# Patient Record
Sex: Female | Born: 1985 | Race: Black or African American | Hispanic: No | Marital: Single | Smoking: Former smoker
Health system: Southern US, Community
[De-identification: ages and names within clinical notes are randomized; demographics above are authoritative.]

## PROBLEM LIST (undated history)

## (undated) ENCOUNTER — Emergency Department (HOSPITAL_COMMUNITY): Payer: 59

## (undated) ENCOUNTER — Inpatient Hospital Stay (HOSPITAL_COMMUNITY): Payer: Self-pay

## (undated) DIAGNOSIS — R51 Headache: Secondary | ICD-10-CM

## (undated) DIAGNOSIS — G809 Cerebral palsy, unspecified: Secondary | ICD-10-CM

## (undated) DIAGNOSIS — R200 Anesthesia of skin: Secondary | ICD-10-CM

## (undated) DIAGNOSIS — R112 Nausea with vomiting, unspecified: Secondary | ICD-10-CM

## (undated) DIAGNOSIS — K769 Liver disease, unspecified: Secondary | ICD-10-CM

## (undated) DIAGNOSIS — F419 Anxiety disorder, unspecified: Secondary | ICD-10-CM

## (undated) DIAGNOSIS — H33003 Unspecified retinal detachment with retinal break, bilateral: Secondary | ICD-10-CM

## (undated) DIAGNOSIS — J309 Allergic rhinitis, unspecified: Secondary | ICD-10-CM

## (undated) DIAGNOSIS — Z9889 Other specified postprocedural states: Secondary | ICD-10-CM

## (undated) DIAGNOSIS — D649 Anemia, unspecified: Secondary | ICD-10-CM

## (undated) DIAGNOSIS — M62838 Other muscle spasm: Secondary | ICD-10-CM

## (undated) DIAGNOSIS — R32 Unspecified urinary incontinence: Secondary | ICD-10-CM

## (undated) DIAGNOSIS — Z973 Presence of spectacles and contact lenses: Secondary | ICD-10-CM

## (undated) DIAGNOSIS — Z789 Other specified health status: Secondary | ICD-10-CM

## (undated) DIAGNOSIS — J45909 Unspecified asthma, uncomplicated: Secondary | ICD-10-CM

## (undated) DIAGNOSIS — M549 Dorsalgia, unspecified: Secondary | ICD-10-CM

## (undated) DIAGNOSIS — R600 Localized edema: Secondary | ICD-10-CM

## (undated) DIAGNOSIS — G709 Myoneural disorder, unspecified: Secondary | ICD-10-CM

## (undated) DIAGNOSIS — R569 Unspecified convulsions: Secondary | ICD-10-CM

## (undated) DIAGNOSIS — I10 Essential (primary) hypertension: Secondary | ICD-10-CM

## (undated) DIAGNOSIS — G40909 Epilepsy, unspecified, not intractable, without status epilepticus: Secondary | ICD-10-CM

## (undated) DIAGNOSIS — R609 Edema, unspecified: Secondary | ICD-10-CM

## (undated) HISTORY — DX: Anemia, unspecified: D64.9

## (undated) HISTORY — DX: Epilepsy, unspecified, not intractable, without status epilepticus: G40.909

## (undated) HISTORY — DX: Liver disease, unspecified: K76.9

## (undated) HISTORY — PX: LEG SURGERY: SHX1003

## (undated) HISTORY — DX: Allergic rhinitis, unspecified: J30.9

## (undated) HISTORY — DX: Cerebral palsy, unspecified: G80.9

## (undated) HISTORY — DX: Unspecified urinary incontinence: R32

## (undated) HISTORY — DX: Unspecified asthma, uncomplicated: J45.909

---
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
## 2004-05-10 ENCOUNTER — Emergency Department (HOSPITAL_COMMUNITY): Admission: EM | Admit: 2004-05-10 | Discharge: 2004-05-10 | Payer: Self-pay | Admitting: Emergency Medicine

## 2004-10-22 ENCOUNTER — Emergency Department (HOSPITAL_COMMUNITY): Admission: EM | Admit: 2004-10-22 | Discharge: 2004-10-22 | Payer: Self-pay | Admitting: Emergency Medicine

## 2004-11-13 ENCOUNTER — Emergency Department (HOSPITAL_COMMUNITY): Admission: EM | Admit: 2004-11-13 | Discharge: 2004-11-13 | Payer: Self-pay | Admitting: Emergency Medicine

## 2005-05-17 ENCOUNTER — Ambulatory Visit (HOSPITAL_COMMUNITY): Admission: RE | Admit: 2005-05-17 | Discharge: 2005-05-17 | Payer: Self-pay | Admitting: Obstetrics & Gynecology

## 2005-06-22 ENCOUNTER — Emergency Department (HOSPITAL_COMMUNITY): Admission: EM | Admit: 2005-06-22 | Discharge: 2005-06-22 | Payer: Self-pay | Admitting: Emergency Medicine

## 2005-06-23 ENCOUNTER — Emergency Department (HOSPITAL_COMMUNITY): Admission: EM | Admit: 2005-06-23 | Discharge: 2005-06-23 | Payer: Self-pay | Admitting: Emergency Medicine

## 2005-07-17 ENCOUNTER — Ambulatory Visit (HOSPITAL_COMMUNITY): Admission: RE | Admit: 2005-07-17 | Discharge: 2005-07-17 | Payer: Self-pay | Admitting: Obstetrics

## 2005-09-21 ENCOUNTER — Encounter: Admission: RE | Admit: 2005-09-21 | Discharge: 2005-10-25 | Payer: Self-pay | Admitting: Internal Medicine

## 2005-12-18 ENCOUNTER — Inpatient Hospital Stay (HOSPITAL_COMMUNITY): Admission: AD | Admit: 2005-12-18 | Discharge: 2005-12-22 | Payer: Self-pay | Admitting: Obstetrics & Gynecology

## 2005-12-19 DIAGNOSIS — O163 Unspecified maternal hypertension, third trimester: Secondary | ICD-10-CM

## 2006-06-12 ENCOUNTER — Encounter: Admission: RE | Admit: 2006-06-12 | Discharge: 2006-09-10 | Payer: Self-pay | Admitting: Internal Medicine

## 2006-08-16 ENCOUNTER — Encounter: Admission: RE | Admit: 2006-08-16 | Discharge: 2006-08-16 | Payer: Self-pay | Admitting: Family Medicine

## 2010-08-27 ENCOUNTER — Encounter: Payer: Self-pay | Admitting: Obstetrics & Gynecology

## 2012-11-03 ENCOUNTER — Encounter: Payer: Self-pay | Admitting: Obstetrics

## 2012-11-03 ENCOUNTER — Ambulatory Visit (INDEPENDENT_AMBULATORY_CARE_PROVIDER_SITE_OTHER): Payer: Medicare Other | Admitting: Obstetrics

## 2012-11-03 VITALS — BP 135/85 | HR 98 | Temp 97.1°F | Wt 134.0 lb

## 2012-11-03 DIAGNOSIS — R109 Unspecified abdominal pain: Secondary | ICD-10-CM

## 2012-11-03 DIAGNOSIS — Z3009 Encounter for other general counseling and advice on contraception: Secondary | ICD-10-CM

## 2012-11-03 LAB — POCT URINALYSIS DIPSTICK
Blood, UA: NEGATIVE
Glucose, UA: NEGATIVE
Ketones, UA: NEGATIVE
pH, UA: 6

## 2012-11-03 NOTE — Progress Notes (Signed)
Patient not examined.  She wants to reschedule appointment with Dr. Tamela Oddi.

## 2012-11-18 ENCOUNTER — Encounter: Payer: Self-pay | Admitting: Obstetrics

## 2013-02-03 ENCOUNTER — Encounter (HOSPITAL_COMMUNITY): Payer: Self-pay | Admitting: Pharmacy Technician

## 2013-02-10 ENCOUNTER — Encounter (HOSPITAL_COMMUNITY): Payer: Self-pay

## 2013-02-10 ENCOUNTER — Encounter (HOSPITAL_COMMUNITY)
Admission: RE | Admit: 2013-02-10 | Discharge: 2013-02-10 | Disposition: A | Discharge status: Home / Self Care | Payer: Medicare Other | Source: Ambulatory Visit | Attending: Oral Surgery | Admitting: Oral Surgery

## 2013-02-10 DIAGNOSIS — G40909 Epilepsy, unspecified, not intractable, without status epilepticus: Secondary | ICD-10-CM | POA: Diagnosis not present

## 2013-02-10 DIAGNOSIS — K006 Disturbances in tooth eruption: Secondary | ICD-10-CM | POA: Diagnosis present

## 2013-02-10 DIAGNOSIS — G809 Cerebral palsy, unspecified: Secondary | ICD-10-CM | POA: Diagnosis not present

## 2013-02-10 DIAGNOSIS — J45909 Unspecified asthma, uncomplicated: Secondary | ICD-10-CM | POA: Diagnosis not present

## 2013-02-10 HISTORY — DX: Other muscle spasm: M62.838

## 2013-02-10 HISTORY — DX: Unspecified convulsions: R56.9

## 2013-02-10 HISTORY — DX: Dorsalgia, unspecified: M54.9

## 2013-02-10 HISTORY — DX: Other specified health status: Z78.9

## 2013-02-10 HISTORY — DX: Edema, unspecified: R60.9

## 2013-02-10 HISTORY — DX: Localized edema: R60.0

## 2013-02-10 HISTORY — DX: Anesthesia of skin: R20.0

## 2013-02-10 HISTORY — DX: Headache: R51

## 2013-02-10 LAB — BASIC METABOLIC PANEL
BUN: 11 mg/dL (ref 6–23)
Chloride: 103 mEq/L (ref 96–112)
Creatinine, Ser: 0.52 mg/dL (ref 0.50–1.10)
GFR calc Af Amer: >90 mL/min (ref >90)
Sodium: 138 mEq/L (ref 135–145)

## 2013-02-10 LAB — HCG, SERUM, QUALITATIVE: Preg, Serum: NEGATIVE

## 2013-02-10 LAB — CBC
MCHC: 32.3 g/dL (ref 30.0–36.0)
MCV: 86.3 fL (ref 78.0–100.0)
RBC: 4.31 MIL/uL (ref 3.87–5.11)
RDW: 11.8 % (ref 11.5–15.5)
WBC: 4.7 10*3/uL (ref 4.0–10.5)

## 2013-02-10 NOTE — Progress Notes (Signed)
Pt doesn't have a cardiologist  Denies ever having an echo/stress test/heart cath  Dr.Avbuere is Medical Md  Denies having an ekg or cxr in past yr

## 2013-02-10 NOTE — Pre-Procedure Instructions (Signed)
Dana Parks  02/10/2013   Your procedure is scheduled on:  Mon, July 14 @ 11:30 AM  Report to [facility] at 8:30 AM.  Call this number if you have problems the morning of surgery: [phone]   Remember:   Do not eat food or drink liquids after midnight.   Take these medicines the morning of surgery with A SIP OF WATER: Albuterol(Proventil-if needed) and Depakote(Divalproex)   Do not wear jewelry, make-up or nail polish.  Do not wear lotions, powders, or perfumes. You may wear deodorant.  Do not shave 48 hours prior to surgery. .  Do not bring valuables to the hospital.  [facility] is not responsible                   for any belongings or valuables.  Contacts, dentures or bridgework may not be worn into surgery.  Leave suitcase in the car. After surgery it may be brought to your room.  .   Patients discharged the day of surgery will not be allowed to drive  home.    Special Instructions: Shower using CHG 2 nights before surgery and the night before surgery.  If you shower the day of surgery use CHG.  Use special wash - you have one bottle of CHG for all showers.  You should use approximately 1/3 of the bottle for each shower.   Please read over the following fact sheets that you were given: Pain Booklet, Coughing and Deep Breathing and Surgical Site Infection Prevention

## 2013-02-11 NOTE — H&P (Signed)
HISTORY AND PHYSICAL  Dana Parks is a 27 y.o. female patient with CC: Painful impacted teeth.  No diagnosis found.  Past Medical History  Diagnosis Date  . Cerebral palsy   . Seizure disorder   . Asthma   . Low sodium diet   . Headache(784.0)     frequently  . Seizures     last one in high school;takes Depakote daily  . Numbness     bilateral feet  . Muscle spasm     takes Flexeril daily  . Peripheral edema     occasionally  . Back pain     unknown     No current facility-administered medications for this encounter.   Current Outpatient Prescriptions  Medication Sig Dispense Refill  . albuterol (PROVENTIL HFA;VENTOLIN HFA) 108 (90 BASE) MCG/ACT inhaler Inhale 2 puffs into the lungs every 6 (six) hours as needed for wheezing.      . cyclobenzaprine (FLEXERIL) 5 MG tablet Take 5 mg by mouth 3 (three) times daily as needed for muscle spasms.      . divalproex (DEPAKOTE ER) 250 MG 24 hr tablet Take 250 mg by mouth daily.      Marland Kitchen ibuprofen (ADVIL,MOTRIN) 800 MG tablet Take 800 mg by mouth every 8 (eight) hours as needed for pain.      . montelukast (SINGULAIR) 10 MG tablet Take 10 mg by mouth at bedtime.      . Norgestimate-Ethinyl Estradiol Triphasic (ORTHO TRI-CYCLEN LO) 0.18/0.215/0.25 MG-25 MCG tab Take 1 tablet by mouth daily.       Allergies  Allergen Reactions  . Dust Mite Extract     Unknown reaction  . Mold Extract (Trichophyton)     Unknown reaction  . Penicillins Other (See Comments)    Unknown reaction  . Soap Other (See Comments)    Unknown reaction  . Codeine Other (See Comments)    GI upset   Active Problems:   * No active hospital problems. *  Vitals: There were no vitals taken for this visit. Lab results: Results for orders placed during the hospital encounter of 02/10/13 (from the past 24 hour(s))  CBC     Status: None   Collection Time    02/10/13  2:03 PM      Result Value Range   WBC 4.7  4.0 - 10.5 K/uL   RBC 4.31  3.87 - 5.11 MIL/uL    Hemoglobin 12.0  12.0 - 15.0 g/dL   HCT 56.2  13.0 - 86.5 %   MCV 86.3  78.0 - 100.0 fL   MCH 27.8  26.0 - 34.0 pg   MCHC 32.3  30.0 - 36.0 g/dL   RDW 78.4  69.6 - 29.5 %   Platelets 306  150 - 400 K/uL  HCG, SERUM, QUALITATIVE     Status: None   Collection Time    02/10/13  2:03 PM      Result Value Range   Preg, Serum NEGATIVE  NEGATIVE  BASIC METABOLIC PANEL     Status: None   Collection Time    02/10/13  2:03 PM      Result Value Range   Sodium 138  135 - 145 mEq/L   Potassium 3.7  3.5 - 5.1 mEq/L   Chloride 103  96 - 112 mEq/L   CO2 28  19 - 32 mEq/L   Glucose, Bld 80  70 - 99 mg/dL   BUN 11  6 - 23 mg/dL   Creatinine,  Ser 0.52  0.50 - 1.10 mg/dL   Calcium 9.3  8.4 - 40.9 mg/dL   GFR calc non Af Amer >90  >90 mL/min   GFR calc Af Amer >90  >90 mL/min   Radiology Results: No results found. General appearance: alert and cooperative Head: Normocephalic, without obvious abnormality, atraumatic Eyes: negative Ears: normal TM's and external ear canals both ears Nose: Nares normal. Septum midline. Mucosa normal. No drainage or sinus tenderness. Throat: lips, mucosa, and tongue normal; teeth and gums normal and Impacted teeth #'s 16, 17, 32 Neck: no adenopathy, supple, symmetrical, trachea midline and thyroid not enlarged, symmetric, no tenderness/mass/nodules Resp: clear to auscultation bilaterally Cardio: regular rate and rhythm, S1, S2 normal, no murmur, click, rub or gallop  Assessment: 27 YO Female Cerebral Palsy, Seizures, Asthma with impacted teeth #'s 16, 17, and 32.  Plan:Extract teeth #'s 16, 17, and 32. General anesthesia. Day surgery.   Trigg Delarocha M 02/11/2013

## 2013-02-15 MED ORDER — CLINDAMYCIN PHOSPHATE 900 MG/50ML IV SOLN
900.0000 mg | INTRAVENOUS | Status: DC
  Filled 2013-02-15: qty 50

## 2013-02-16 ENCOUNTER — Encounter (HOSPITAL_COMMUNITY): Payer: Self-pay

## 2013-02-16 ENCOUNTER — Ambulatory Visit (HOSPITAL_COMMUNITY)
Admission: RE | Admit: 2013-02-16 | Discharge: 2013-02-16 | Disposition: A | Discharge status: Home / Self Care | Payer: Medicare Other | Source: Ambulatory Visit | Attending: Oral Surgery | Admitting: Oral Surgery

## 2013-02-16 ENCOUNTER — Encounter (HOSPITAL_COMMUNITY): Payer: Self-pay | Admitting: Anesthesiology

## 2013-02-16 ENCOUNTER — Encounter (HOSPITAL_COMMUNITY)
Admission: RE | Disposition: A | Discharge status: Home / Self Care | Payer: Self-pay | Source: Ambulatory Visit | Attending: Oral Surgery

## 2013-02-16 ENCOUNTER — Ambulatory Visit (HOSPITAL_COMMUNITY): Payer: Medicare Other | Admitting: Anesthesiology

## 2013-02-16 DIAGNOSIS — K011 Impacted teeth: Secondary | ICD-10-CM

## 2013-02-16 DIAGNOSIS — J45909 Unspecified asthma, uncomplicated: Secondary | ICD-10-CM | POA: Insufficient documentation

## 2013-02-16 DIAGNOSIS — K006 Disturbances in tooth eruption: Secondary | ICD-10-CM | POA: Diagnosis not present

## 2013-02-16 DIAGNOSIS — G40909 Epilepsy, unspecified, not intractable, without status epilepticus: Secondary | ICD-10-CM | POA: Insufficient documentation

## 2013-02-16 DIAGNOSIS — G809 Cerebral palsy, unspecified: Secondary | ICD-10-CM | POA: Insufficient documentation

## 2013-02-16 HISTORY — PX: TOOTH EXTRACTION: SHX859

## 2013-02-16 SURGERY — DENTAL RESTORATION/EXTRACTIONS
Anesthesia: General | Site: Mouth | Wound class: Clean Contaminated

## 2013-02-16 MED ORDER — 0.9 % SODIUM CHLORIDE (POUR BTL) OPTIME
TOPICAL | Status: DC | PRN
  Administered 2013-02-16: 1000 mL

## 2013-02-16 MED ORDER — HYDROMORPHONE HCL PF 1 MG/ML IJ SOLN
INTRAMUSCULAR | Status: AC
  Filled 2013-02-16: qty 1

## 2013-02-16 MED ORDER — OXYMETAZOLINE HCL 0.05 % NA SOLN
NASAL | Status: AC
  Filled 2013-02-16: qty 15

## 2013-02-16 MED ORDER — MIDAZOLAM HCL 5 MG/5ML IJ SOLN
INTRAMUSCULAR | Status: DC | PRN
  Administered 2013-02-16 (×2): 1 mg via INTRAVENOUS

## 2013-02-16 MED ORDER — LABETALOL HCL 5 MG/ML IV SOLN
INTRAVENOUS | Status: DC | PRN
  Administered 2013-02-16: 5 mg via INTRAVENOUS

## 2013-02-16 MED ORDER — CLINDAMYCIN PHOSPHATE 600 MG/50ML IV SOLN
600.0000 mg | Freq: Four times a day (QID) | INTRAVENOUS | Status: DC
  Administered 2013-02-16: 600 mg via INTRAVENOUS
  Filled 2013-02-16: qty 50

## 2013-02-16 MED ORDER — SUCCINYLCHOLINE CHLORIDE 20 MG/ML IJ SOLN
INTRAMUSCULAR | Status: DC | PRN
  Administered 2013-02-16: 100 mg via INTRAVENOUS

## 2013-02-16 MED ORDER — LACTATED RINGERS IV SOLN
INTRAVENOUS | Status: DC | PRN
  Administered 2013-02-16 (×2): via INTRAVENOUS

## 2013-02-16 MED ORDER — SODIUM CHLORIDE 0.9 % IR SOLN
Status: DC | PRN
  Administered 2013-02-16: 1000 mL

## 2013-02-16 MED ORDER — OXYCODONE-ACETAMINOPHEN 5-325 MG PO TABS: 1.0000 | ORAL_TABLET | ORAL | Status: DC | PRN

## 2013-02-16 MED ORDER — LIDOCAINE HCL 4 % MT SOLN
OROMUCOSAL | Status: DC | PRN
  Administered 2013-02-16: 4 mL via TOPICAL

## 2013-02-16 MED ORDER — LACTATED RINGERS IV SOLN
INTRAVENOUS | Status: DC
  Administered 2013-02-16: 08:00:00 via INTRAVENOUS

## 2013-02-16 MED ORDER — PROPOFOL 10 MG/ML IV BOLUS
INTRAVENOUS | Status: DC | PRN
  Administered 2013-02-16: 200 mg via INTRAVENOUS

## 2013-02-16 MED ORDER — PROMETHAZINE HCL 25 MG/ML IJ SOLN: 6.2500 mg | INTRAMUSCULAR | Status: DC | PRN

## 2013-02-16 MED ORDER — HYDROMORPHONE HCL PF 1 MG/ML IJ SOLN
0.2500 mg | INTRAMUSCULAR | Status: DC | PRN
  Administered 2013-02-16: 0.5 mg via INTRAVENOUS

## 2013-02-16 MED ORDER — ONDANSETRON HCL 4 MG/2ML IJ SOLN
INTRAMUSCULAR | Status: DC | PRN
  Administered 2013-02-16: 4 mg via INTRAVENOUS

## 2013-02-16 MED ORDER — ONDANSETRON HCL 4 MG PO TABS: 4.0000 mg | ORAL_TABLET | Freq: Three times a day (TID) | ORAL | Status: DC | PRN

## 2013-02-16 MED ORDER — KETOROLAC TROMETHAMINE 30 MG/ML IJ SOLN
INTRAMUSCULAR | Status: DC | PRN
  Administered 2013-02-16: 30 mg via INTRAVENOUS

## 2013-02-16 MED ORDER — FENTANYL CITRATE 0.05 MG/ML IJ SOLN
INTRAMUSCULAR | Status: DC | PRN
  Administered 2013-02-16 (×4): 50 ug via INTRAVENOUS

## 2013-02-16 MED ORDER — LIDOCAINE-EPINEPHRINE 2 %-1:100000 IJ SOLN
INTRAMUSCULAR | Status: AC
  Filled 2013-02-16: qty 1

## 2013-02-16 MED ORDER — OXYMETAZOLINE HCL 0.05 % NA SOLN
NASAL | Status: DC | PRN
  Administered 2013-02-16: 1 via NASAL

## 2013-02-16 MED ORDER — LIDOCAINE-EPINEPHRINE 2 %-1:100000 IJ SOLN
INTRAMUSCULAR | Status: DC | PRN
  Administered 2013-02-16: 10 mL

## 2013-02-16 SURGICAL SUPPLY — 35 items
BLADE SURG 15 STRL LF DISP TIS (BLADE) IMPLANT
BLADE SURG 15 STRL SS (BLADE)
BUR CROSS CUT FISSURE 1.6 (BURR) ×1 IMPLANT
BUR EGG ELITE 4.0 (BURR) IMPLANT
BUR SURG 4X8 MED (BURR) IMPLANT
BURR SURG 4X8 MED (BURR)
CANISTER SUCTION 2500CC (MISCELLANEOUS) ×2 IMPLANT
CLOTH BEACON ORANGE TIMEOUT ST (SAFETY) ×2 IMPLANT
COVER SURGICAL LIGHT HANDLE (MISCELLANEOUS) ×2 IMPLANT
DECANTER SPIKE VIAL GLASS SM (MISCELLANEOUS) ×1 IMPLANT
GAUZE PACKING FOLDED 2  STR (GAUZE/BANDAGES/DRESSINGS) ×1
GAUZE PACKING FOLDED 2 STR (GAUZE/BANDAGES/DRESSINGS) ×1 IMPLANT
GAUZE SPONGE 4X4 16PLY XRAY LF (GAUZE/BANDAGES/DRESSINGS) IMPLANT
GLOVE BIO SURGEON STRL SZ 6.5 (GLOVE) ×4 IMPLANT
GLOVE BIO SURGEON STRL SZ7 (GLOVE) IMPLANT
GLOVE BIO SURGEON STRL SZ7.5 (GLOVE) ×3 IMPLANT
GLOVE BIOGEL PI IND STRL 7.0 (GLOVE) ×1 IMPLANT
GLOVE BIOGEL PI INDICATOR 7.0 (GLOVE) ×1
GOWN STRL NON-REIN LRG LVL3 (GOWN DISPOSABLE) ×3 IMPLANT
GOWN STRL REIN XL XLG (GOWN DISPOSABLE) ×2 IMPLANT
KIT BASIN OR (CUSTOM PROCEDURE TRAY) ×2 IMPLANT
KIT ROOM TURNOVER OR (KITS) ×2 IMPLANT
NDL BLUNT 16X1.5 OR ONLY (NEEDLE) IMPLANT
NEEDLE 22X1 1/2 (OR ONLY) (NEEDLE) ×2 IMPLANT
NEEDLE BLUNT 16X1.5 OR ONLY (NEEDLE) IMPLANT
NS IRRIG 1000ML POUR BTL (IV SOLUTION) ×2 IMPLANT
PAD ARMBOARD 7.5X6 YLW CONV (MISCELLANEOUS) ×4 IMPLANT
SUT CHROMIC 3 0 PS 2 (SUTURE) ×4 IMPLANT
SYR 50ML SLIP (SYRINGE) IMPLANT
TOWEL OR 17X24 6PK STRL BLUE (TOWEL DISPOSABLE) IMPLANT
TOWEL OR 17X26 10 PK STRL BLUE (TOWEL DISPOSABLE) ×2 IMPLANT
TRAY ENT MC OR (CUSTOM PROCEDURE TRAY) ×2 IMPLANT
TUBING IRRIGATION (MISCELLANEOUS) ×1 IMPLANT
WATER STERILE IRR 1000ML POUR (IV SOLUTION) ×2 IMPLANT
YANKAUER SUCT BULB TIP NO VENT (SUCTIONS) ×2 IMPLANT

## 2013-02-16 NOTE — Op Note (Signed)
02/16/2013  11:23 AM  PATIENT:  Dana Parks  27 y.o. female  PRE-OPERATIVE DIAGNOSIS:  IMPACTED TEETH #'s 16, 40, and 32  POST-OPERATIVE DIAGNOSIS:  SAME  PROCEDURE:  Procedure(s): EXTRACTION 16, 17, 32  SURGEON:  Surgeon(s): Georgia Lopes, DDS  ANESTHESIA:   local and general  EBL:  minimal  DRAINS: none   SPECIMEN:  No Specimen  COUNTS:  YES  PLAN OF CARE: Discharge to home after PACU  PATIENT DISPOSITION:  PACU - hemodynamically stable.   PROCEDURE DETAILS: Dictation #[id number]  Georgia Lopes, DMD 02/16/2013 11:23 AM

## 2013-02-16 NOTE — Preoperative (Signed)
Beta Blockers   Reason not to administer Beta Blockers:Not Applicable 

## 2013-02-16 NOTE — Anesthesia Postprocedure Evaluation (Signed)
Anesthesia Post Note  Patient: Dana Parks  Procedure(s) Performed: Procedure(s) (LRB): EXTRACTION 16, 17, 32 (N/A)  Anesthesia type: general  Patient location: PACU  Post pain: Pain level controlled  Post assessment: Patient's Cardiovascular Status Stable  Last Vitals:  Filed Vitals:   02/16/13 1232  BP: 144/110  Pulse:   Temp:   Resp: 13    Post vital signs: Reviewed and stable  Level of consciousness: sedated  Complications: No apparent anesthesia complications

## 2013-02-16 NOTE — Anesthesia Preprocedure Evaluation (Addendum)
Anesthesia Evaluation  Patient identified by MRN, date of birth, ID band Patient awake    Reviewed: Allergy & Precautions, H&P , NPO status , Patient's Chart, lab work & pertinent test results  Airway Mallampati: II TM Distance: >3 FB     Dental  (+) Poor Dentition and Dental Advisory Given   Pulmonary asthma ,    Pulmonary exam normal       Cardiovascular negative cardio ROS      Neuro/Psych  Headaches, Seizures -, Well Controlled,  negative psych ROS   GI/Hepatic negative GI ROS, Neg liver ROS,   Endo/Other  negative endocrine ROS  Renal/GU negative Renal ROS     Musculoskeletal   Abdominal   Peds  Hematology negative hematology ROS (+)   Anesthesia Other Findings   Reproductive/Obstetrics                          Anesthesia Physical Anesthesia Plan  ASA: III  Anesthesia Plan: General   Post-op Pain Management:    Induction: Intravenous  Airway Management Planned: Nasal ETT  Additional Equipment:   Intra-op Plan:   Post-operative Plan: Extubation in OR  Informed Consent: I have reviewed the patients History and Physical, chart, labs and discussed the procedure including the risks, benefits and alternatives for the proposed anesthesia with the patient or authorized representative who has indicated his/her understanding and acceptance.   Dental advisory given  Plan Discussed with: CRNA, Anesthesiologist and Surgeon  Anesthesia Plan Comments:        Anesthesia Quick Evaluation

## 2013-02-16 NOTE — Transfer of Care (Signed)
Immediate Anesthesia Transfer of Care Note  Patient: Dana Parks  Procedure(s) Performed: Procedure(s): EXTRACTION 16, 17, 32 (N/A)  Patient Location: PACU  Anesthesia Type:General  Level of Consciousness: sedated  Airway & Oxygen Therapy: Patient Spontanous Breathing and Patient connected to face mask oxygen  Post-op Assessment: Report given to PACU RN, Post -op Vital signs reviewed and stable and Patient moving all extremities  Post vital signs: Reviewed and stable  Complications: No apparent anesthesia complications

## 2013-02-16 NOTE — H&P (Signed)
H&P documentation  -History and Physical Reviewed  -Patient has been re-examined  -No change in the plan of care  [name] M  

## 2013-02-16 NOTE — Anesthesia Procedure Notes (Signed)
Procedure Name: Intubation Date/Time: 02/16/2013 10:55 AM Performed by: Sherie Don Pre-anesthesia Checklist: Patient identified, Emergency Drugs available, Suction available, Patient being monitored and Timeout performed Patient Re-evaluated:Patient Re-evaluated prior to inductionOxygen Delivery Method: Circle system utilized Preoxygenation: Pre-oxygenation with 100% oxygen Intubation Type: IV induction Ventilation: Mask ventilation without difficulty Laryngoscope Size: Mac and 3 Grade View: Grade I Nasal Tubes: Right and Magill forceps- large, utilized Tube size: 6.5 mm Number of attempts: 1 Placement Confirmation: ETT inserted through vocal cords under direct vision,  positive ETCO2,  CO2 detector and breath sounds checked- equal and bilateral Secured at: 22 cm Tube secured with: Tape Dental Injury: Teeth and Oropharynx as per pre-operative assessment

## 2013-02-17 ENCOUNTER — Encounter (HOSPITAL_COMMUNITY): Payer: Self-pay | Admitting: Oral Surgery

## 2013-02-17 NOTE — Op Note (Signed)
NAME:  Dana Parks, Dana Parks               ACCOUNT NO.:  [id number]  MEDICAL RECORD NO.:  [id number]  LOCATION:  [facility]                         FACILITY:  [facility]  PHYSICIAN:  Georgia Lopes, M.D.  DATE OF BIRTH:  1986-02-07  DATE OF PROCEDURE:  02/16/2013 DATE OF DISCHARGE:  02/16/2013                              OPERATIVE REPORT   PREOPERATIVE DIAGNOSIS:  Impacted teeth numbers 16, 17, 32.  POSTOPERATIVE DIAGNOSIS:  Impacted teeth numbers 16, 17, 32.  PROCEDURE:  Extraction of impacted teeth numbers 16, 17, 32.  SURGEON:  Georgia Lopes, M.D.  ANESTHESIA:  Quita Skye. Krista Blue, M.D., general and nasal intubation.  INDICATIONS FOR PROCEDURE:  Dana Parks is a 27 year old female who is referred to me by her general dentist because of painful impacted teeth numbers 16, 17, 32.  She has a past medical history for cerebral palsy. Because the teeth were completely bony impacted and then unusually difficult position, it was recommended that the patient undergo general anesthesia for the procedure.  PROCEDURE IN DETAIL:  The patient was taken to the operating room, placed on the table in supine position.  General anesthesia was administered intravenously, and a nasal endotracheal tube was placed and secured.  The eyes were protected.  The patient was draped.  Time-out was performed.  The posterior pharynx was suctioned with Yankauer suction, and a throat pack was placed.  A 2% lidocaine with 1:100,000 epinephrine was infiltrated in the inferior alveolar block on the right and left side and buccal and palatal infiltration around tooth #16; a total of 8 mL was utilized.  A bite block was placed on the right side of the mouth, and a sweetheart retractor was used to retract the tongue. A 15 blade was used to make a distal angular incision overlying tooth #17 and carried anteriorly to the sulcus between teeth numbers 18 and 19.  In the maxilla, a 15 blade was used to make a full-thickness incision  overlying tooth #16 and carried anteriorly to the sulcus between teeth numbers 13 and 14 the periosteum was reflected buccally in the maxilla and mandible.  Bone was removed with a Stryker handpiece in the mandible and tooth #17 was identified, sectioned into multiple pieces and removed with a 301 elevator and the dental rongeur.  The socket was then curetted, irrigated, and closed with 3-0 chromic in the maxilla.  Bone was removed using the Stryker handpiece and the #301 elevator.  Then, the tooth #16 was removed using the [facility] forceps, and the socket was then curetted and irrigated and closed with 3-0 chromic.  The bite block and sweetheart retractor were repositioned to the other side of the mouth, and then a 15 blade was used to make a distal angular incision overlying tooth #32.  The periosteum was reflected up to the buccal aspect of tooth #30 and 31.  The periosteum was reflected with a periosteal elevator.  Bone was removed with a Striker handpiece, and then the tooth was identified and sectioned in multiple pieces and removed with a 301 elevator and rongeurs.  The socket was then curetted, irrigated and closed with 3-0 chromic.  The oral cavity was then inspected and found  to have good hemostasis and closure.  The oral cavity was irrigated, suctioned, throat pack was removed.  The patient was awakened, taken to the recovery room, breathing spontaneously in good condition.  ESTIMATED BLOOD LOSS:  Minimum.  COMPLICATIONS:  None.  SPECIMENS:  None.     Georgia Lopes, M.D.     SMJ/MEDQ  D:  02/16/2013  T:  02/16/2013  Job:  [id number]

## 2013-02-19 ENCOUNTER — Ambulatory Visit: Payer: Medicare Other | Admitting: Obstetrics & Gynecology

## 2014-03-18 ENCOUNTER — Emergency Department (HOSPITAL_COMMUNITY)
Admission: EM | Admit: 2014-03-18 | Discharge: 2014-03-18 | Disposition: A | Discharge status: Home / Self Care | Payer: Medicare Other | Attending: Emergency Medicine | Admitting: Emergency Medicine

## 2014-03-18 ENCOUNTER — Emergency Department (HOSPITAL_COMMUNITY): Payer: Medicare Other

## 2014-03-18 ENCOUNTER — Encounter (HOSPITAL_COMMUNITY): Payer: Self-pay | Admitting: Emergency Medicine

## 2014-03-18 DIAGNOSIS — Z880 Allergy status to penicillin: Secondary | ICD-10-CM | POA: Insufficient documentation

## 2014-03-18 DIAGNOSIS — Z8669 Personal history of other diseases of the nervous system and sense organs: Secondary | ICD-10-CM | POA: Insufficient documentation

## 2014-03-18 DIAGNOSIS — Z791 Long term (current) use of non-steroidal anti-inflammatories (NSAID): Secondary | ICD-10-CM | POA: Diagnosis not present

## 2014-03-18 DIAGNOSIS — F411 Generalized anxiety disorder: Secondary | ICD-10-CM | POA: Insufficient documentation

## 2014-03-18 DIAGNOSIS — F419 Anxiety disorder, unspecified: Secondary | ICD-10-CM

## 2014-03-18 DIAGNOSIS — Z3202 Encounter for pregnancy test, result negative: Secondary | ICD-10-CM | POA: Insufficient documentation

## 2014-03-18 DIAGNOSIS — R0789 Other chest pain: Secondary | ICD-10-CM | POA: Diagnosis not present

## 2014-03-18 DIAGNOSIS — F17200 Nicotine dependence, unspecified, uncomplicated: Secondary | ICD-10-CM | POA: Insufficient documentation

## 2014-03-18 DIAGNOSIS — J45901 Unspecified asthma with (acute) exacerbation: Secondary | ICD-10-CM | POA: Insufficient documentation

## 2014-03-18 DIAGNOSIS — Z79899 Other long term (current) drug therapy: Secondary | ICD-10-CM | POA: Diagnosis not present

## 2014-03-18 DIAGNOSIS — R079 Chest pain, unspecified: Secondary | ICD-10-CM | POA: Insufficient documentation

## 2014-03-18 LAB — BASIC METABOLIC PANEL
Anion gap: 14 (ref 5–15)
BUN: 12 mg/dL (ref 6–23)
CALCIUM: 9.4 mg/dL (ref 8.4–10.5)
CO2: 24 mEq/L (ref 19–32)
Chloride: 102 mEq/L (ref 96–112)
Creatinine, Ser: 0.63 mg/dL (ref 0.50–1.10)
Glucose, Bld: 81 mg/dL (ref 70–99)
POTASSIUM: 3.9 meq/L (ref 3.7–5.3)
SODIUM: 140 meq/L (ref 137–147)

## 2014-03-18 LAB — CBC
HEMATOCRIT: 38.3 % (ref 36.0–46.0)
Hemoglobin: 12 g/dL (ref 12.0–15.0)
MCH: 27.8 pg (ref 26.0–34.0)
MCHC: 31.3 g/dL (ref 30.0–36.0)
MCV: 88.7 fL (ref 78.0–100.0)
Platelets: 292 10*3/uL (ref 150–400)
RBC: 4.32 MIL/uL (ref 3.87–5.11)
RDW: 12.6 % (ref 11.5–15.5)
WBC: 5.1 10*3/uL (ref 4.0–10.5)

## 2014-03-18 LAB — POC URINE PREG, ED: Preg Test, Ur: NEGATIVE

## 2014-03-18 LAB — I-STAT TROPONIN, ED: Troponin i, poc: 0 ng/mL (ref 0.00–0.08)

## 2014-03-18 MED ORDER — LORAZEPAM 1 MG PO TABS
1.0000 mg | ORAL_TABLET | Freq: Once | ORAL | Status: AC
  Administered 2014-03-18: 1 mg via ORAL
  Filled 2014-03-18: qty 1

## 2014-03-18 NOTE — ED Notes (Signed)
Pt was upstairs with her mother and mother got upset, then she got upset.  Pt states chest started hurting and became sob.

## 2014-03-18 NOTE — ED Notes (Signed)
Pt states that her pain is worse and her chest is hurting

## 2014-03-18 NOTE — Discharge Instructions (Signed)
Chest Pain (Nonspecific) °It is often hard to give a specific diagnosis for the cause of chest pain. There is always a chance that your pain could be related to something serious, such as a heart attack or a blood clot in the lungs. You need to follow up with your health care provider for further evaluation. °CAUSES  °· Heartburn. °· Pneumonia or bronchitis. °· Anxiety or stress. °· Inflammation around your heart (pericarditis) or lung (pleuritis or pleurisy). °· A blood clot in the lung. °· A collapsed lung (pneumothorax). It can develop suddenly on its own (spontaneous pneumothorax) or from trauma to the chest. °· Shingles infection (herpes zoster virus). °The chest wall is composed of bones, muscles, and cartilage. Any of these can be the source of the pain. °· The bones can be bruised by injury. °· The muscles or cartilage can be strained by coughing or overwork. °· The cartilage can be affected by inflammation and become sore (costochondritis). °DIAGNOSIS  °Lab tests or other studies may be needed to find the cause of your pain. Your health care provider may have you take a test called an ambulatory electrocardiogram (ECG). An ECG records your heartbeat patterns over a 24-hour period. You may also have other tests, such as: °· Transthoracic echocardiogram (TTE). During echocardiography, sound waves are used to evaluate how blood flows through your heart. °· Transesophageal echocardiogram (TEE). °· Cardiac monitoring. This allows your health care provider to monitor your heart rate and rhythm in real time. °· Holter monitor. This is a portable device that records your heartbeat and can help diagnose heart arrhythmias. It allows your health care provider to track your heart activity for several days, if needed. °· Stress tests by exercise or by giving medicine that makes the heart beat faster. °TREATMENT  °· Treatment depends on what may be causing your chest pain. Treatment may include: °· Acid blockers for  heartburn. °· Anti-inflammatory medicine. °· Pain medicine for inflammatory conditions. °· Antibiotics if an infection is present. °· You may be advised to change lifestyle habits. This includes stopping smoking and avoiding alcohol, caffeine, and chocolate. °· You may be advised to keep your head raised (elevated) when sleeping. This reduces the chance of acid going backward from your stomach into your esophagus. °Most of the time, nonspecific chest pain will improve within 2-3 days with rest and mild pain medicine.  °HOME CARE INSTRUCTIONS  °· If antibiotics were prescribed, take them as directed. Finish them even if you start to feel better. °· For the next few days, avoid physical activities that bring on chest pain. Continue physical activities as directed. °· Do not use any tobacco products, including cigarettes, chewing tobacco, or electronic cigarettes. °· Avoid drinking alcohol. °· Only take medicine as directed by your health care provider. °· Follow your health care provider's suggestions for further testing if your chest pain does not go away. °· Keep any follow-up appointments you made. If you do not go to an appointment, you could develop lasting (chronic) problems with pain. If there is any problem keeping an appointment, call to reschedule. °SEEK MEDICAL CARE IF:  °· Your chest pain does not go away, even after treatment. °· You have a rash with blisters on your chest. °· You have a fever. °SEEK IMMEDIATE MEDICAL CARE IF:  °· You have increased chest pain or pain that spreads to your arm, neck, jaw, back, or abdomen. °· You have shortness of breath. °· You have an increasing cough, or you cough   up blood. °· You have severe back or abdominal pain. °· You feel nauseous or vomit. °· You have severe weakness. °· You faint. °· You have chills. °This is an emergency. Do not wait to see if the pain will go away. Get medical help at once. Call your local emergency services (911 in [place] Do not drive  yourself to the hospital. °MAKE SURE YOU:  °· Understand these instructions. °· Will watch your condition. °· Will get help right away if you are not doing well or get worse. °Document Released: 05/02/2005 Document Revised: 07/28/2013 Document Reviewed: 02/26/2008 °ExitCare® Patient Information ©2015 [employer]. This information is not intended to replace advice given to you by your health care provider. Make sure you discuss any questions you have with your health care provider. ° °Panic Attacks °Panic attacks are sudden, short-lived surges of severe anxiety, fear, or discomfort. They may occur for no reason when you are relaxed, when you are anxious, or when you are sleeping. Panic attacks may occur for a number of reasons:  °· Healthy people occasionally have panic attacks in extreme, life-threatening situations, such as war or natural disasters. Normal anxiety is a protective mechanism of the body that helps us react to danger (fight or flight response). °· Panic attacks are often seen with anxiety disorders, such as panic disorder, social anxiety disorder, generalized anxiety disorder, and phobias. Anxiety disorders cause excessive or uncontrollable anxiety. They may interfere with your relationships or other life activities. °· Panic attacks are sometimes seen with other mental illnesses, such as depression and posttraumatic stress disorder. °· Certain medical conditions, prescription medicines, and drugs of abuse can cause panic attacks. °SYMPTOMS  °Panic attacks start suddenly, peak within 20 minutes, and are accompanied by four or more of the following symptoms: °· Pounding heart or fast heart rate (palpitations). °· Sweating. °· Trembling or shaking. °· Shortness of breath or feeling smothered. °· Feeling choked. °· Chest pain or discomfort. °· Nausea or strange feeling in your stomach. °· Dizziness, light-headedness, or feeling like you will faint. °· Chills or hot flushes. °· Numbness or tingling in  your lips or hands and feet. °· Feeling that things are not real or feeling that you are not yourself. °· Fear of losing control or going crazy. °· Fear of dying. °Some of these symptoms can mimic serious medical conditions. For example, you may think you are having a heart attack. Although panic attacks can be very scary, they are not life threatening. °DIAGNOSIS  °Panic attacks are diagnosed through an assessment by your health care provider. Your health care provider will ask questions about your symptoms, such as where and when they occurred. Your health care provider will also ask about your medical history and use of alcohol and drugs, including prescription medicines. Your health care provider may order blood tests or other studies to rule out a serious medical condition. Your health care provider may refer you to a mental health professional for further evaluation. °TREATMENT  °· Most healthy people who have one or two panic attacks in an extreme, life-threatening situation will not require treatment. °· The treatment for panic attacks associated with anxiety disorders or other mental illness typically involves counseling with a mental health professional, medicine, or a combination of both. Your health care provider will help determine what treatment is best for you. °· Panic attacks due to physical illness usually go away with treatment of the illness. If prescription medicine is causing panic attacks, talk with your health care   provider about stopping the medicine, decreasing the dose, or substituting another medicine. °· Panic attacks due to alcohol or drug abuse go away with abstinence. Some adults need professional help in order to stop drinking or using drugs. °HOME CARE INSTRUCTIONS  °· Take all medicines as directed by your health care provider.   °· Schedule and attend follow-up visits as directed by your health care provider. It is important to keep all your appointments. °SEEK MEDICAL CARE  IF: °· You are not able to take your medicines as prescribed. °· Your symptoms do not improve or get worse. °SEEK IMMEDIATE MEDICAL CARE IF:  °· You experience panic attack symptoms that are different than your usual symptoms. °· You have serious thoughts about hurting yourself or others. °· You are taking medicine for panic attacks and have a serious side effect. °MAKE SURE YOU: °· Understand these instructions. °· Will watch your condition. °· Will get help right away if you are not doing well or get worse. °Document Released: 07/23/2005 Document Revised: 07/28/2013 Document Reviewed: 03/06/2013 °ExitCare® Patient Information ©2015 [employer]. This information is not intended to replace advice given to you by your health care provider. Make sure you discuss any questions you have with your health care provider. ° °

## 2014-03-18 NOTE — ED Provider Notes (Signed)
CSN: [id number]     Arrival date & time 03/18/14  1245 History   First MD Initiated Contact with Patient 03/18/14 1652     Chief Complaint  Patient presents with  . Chest Pain  . Shortness of Breath     (Consider location/radiation/quality/duration/timing/severity/associated sxs/prior Treatment) Patient is a 28 y.o. female presenting with chest pain and shortness of breath. The history is provided by the patient.  Chest Pain Associated symptoms: shortness of breath   Associated symptoms: no abdominal pain, no back pain, no headache, no nausea, no numbness, not vomiting and no weakness   Shortness of Breath Associated symptoms: chest pain   Associated symptoms: no abdominal pain, no headaches, no rash and no vomiting    patient was upstairs in the hospital with her mother, who is been sick. She states her mother got agitated and then she got anxious. She states she began to have chest pain with it. She states it is sharp and comes and goes. No fevers. She's had some shortness of breath with the episodes. No cough. She states she continues to have pain. Each of the episodes last about a second. Some mild swelling of her ankles, which is unusual for her. She's a history of cerebral palsy, she does not smoke. No known cardiac history  Past Medical History  Diagnosis Date  . Cerebral palsy   . Seizure disorder   . Asthma   . Low sodium diet   . Headache(784.0)     frequently  . Seizures     last one in high school;takes Depakote daily  . Numbness     bilateral feet  . Muscle spasm     takes Flexeril daily  . Peripheral edema     occasionally  . Back pain     unknown    Past Surgical History  Procedure Laterality Date  . Leg surgery Left   . Bilateral leg surgery as a child    . Cesarean section  2007  . Tooth extraction N/A 02/16/2013    Procedure: EXTRACTION 16, 17, 32;  Surgeon: Georgia Lopes, DDS;  Location: [facility] OR;  Service: Oral Surgery;  Laterality: N/A;   No family  history on file. History  Substance Use Topics  . Smoking status: Current Every Day Smoker -- 0.25 packs/day for 7 years    Types: Cigarettes  . Smokeless tobacco: Never Used  . Alcohol Use: No   OB History   Grav Para Term Preterm Abortions TAB SAB Ect Mult Living                 Review of Systems  Constitutional: Negative for activity change and appetite change.  Eyes: Negative for pain.  Respiratory: Positive for shortness of breath. Negative for chest tightness.   Cardiovascular: Positive for chest pain. Negative for leg swelling.  Gastrointestinal: Negative for nausea, vomiting, abdominal pain and diarrhea.  Genitourinary: Negative for flank pain.  Musculoskeletal: Negative for back pain and neck stiffness.  Skin: Negative for rash.  Neurological: Negative for weakness, numbness and headaches.  Psychiatric/Behavioral: Negative for behavioral problems.      Allergies  Codeine; Dust mite extract; Mold extract; Penicillins; and Soap  Home Medications   Prior to Admission medications   Medication Sig Start Date End Date Taking? Authorizing Provider  albuterol (PROVENTIL HFA;VENTOLIN HFA) 108 (90 BASE) MCG/ACT inhaler Inhale 2 puffs into the lungs every 6 (six) hours as needed for wheezing.   Yes Historical Provider, MD  cyclobenzaprine (FLEXERIL)  5 MG tablet Take 5 mg by mouth 3 (three) times daily as needed for muscle spasms.   Yes Historical Provider, MD  divalproex (DEPAKOTE ER) 250 MG 24 hr tablet Take 250 mg by mouth daily.   Yes Historical Provider, MD  ibuprofen (ADVIL,MOTRIN) 800 MG tablet Take 800 mg by mouth every 8 (eight) hours as needed for pain.   Yes Historical Provider, MD  loratadine (CLARITIN) 10 MG tablet Take 10 mg by mouth daily.   Yes Historical Provider, MD  montelukast (SINGULAIR) 10 MG tablet Take 10 mg by mouth at bedtime.   Yes Historical Provider, MD   BP 131/87  Pulse 95  Temp(Src) 98.1 F (36.7 C) (Oral)  Resp 24  SpO2 100%  LMP  02/17/2014 Physical Exam  Nursing note and vitals reviewed. Constitutional: She is oriented to person, place, and time. She appears well-developed and well-nourished.  HENT:  Head: Normocephalic and atraumatic.  Eyes: EOM are normal. Pupils are equal, round, and reactive to light.  Neck: Normal range of motion. Neck supple.  Cardiovascular: Normal rate, regular rhythm and normal heart sounds.   No murmur heard. Pulmonary/Chest: Effort normal and breath sounds normal. No respiratory distress. She has no wheezes. She has no rales.  Abdominal: Soft. Bowel sounds are normal. She exhibits no distension. There is no tenderness. There is no rebound and no guarding.  Musculoskeletal: Normal range of motion.  Neurological: She is alert and oriented to person, place, and time. No cranial nerve deficit.  Skin: Skin is warm and dry.  Psychiatric: She has a normal mood and affect. Her speech is normal.    ED Course  Procedures (including critical care time) Labs Review Labs Reviewed  CBC  BASIC METABOLIC PANEL  I-STAT TROPOININ, ED  POC URINE PREG, ED    Imaging Review Dg Chest 2 View  03/18/2014   CLINICAL DATA:  Chest pain  EXAM: CHEST  2 VIEW  COMPARISON:  May 10, 2004  FINDINGS: Lungs are clear. Heart size and pulmonary vascularity are normal. No pneumothorax. No adenopathy. No bone lesions.  IMPRESSION: No edema or consolidation.   Electronically Signed   By: Bretta BangWilliam  Woodruff M.D.   On: 03/18/2014 13:51     EKG Interpretation None      Date: 03/18/2014  Rate: 103  Rhythm: normal sinus rhythm  QRS Axis: normal  Intervals: normal  ST/T Wave abnormalities: normal  Conduction Disutrbances:none  Narrative Interpretation:   Old EKG Reviewed: none available   MDM   Final diagnoses:  Other chest pain  Anxiety    Patient with chest pain. EKG reassuring. Likely allergic component. Doubt pulmonary embolism. Will discharge home    Juliet Rudeathan R. Rubin PayorPickering, MD 03/18/14 1820

## 2014-03-18 NOTE — ED Notes (Signed)
Patient was  Upstairs in the hospital visiting her mother and states her mother became upset which in turn made her upset and she started having chest pain , states her legs are weak. Family at bedside.

## 2014-03-19 ENCOUNTER — Encounter (HOSPITAL_COMMUNITY): Payer: Self-pay | Admitting: Emergency Medicine

## 2014-03-19 ENCOUNTER — Emergency Department (HOSPITAL_COMMUNITY): Payer: Medicare Other

## 2014-03-19 ENCOUNTER — Emergency Department (HOSPITAL_COMMUNITY)
Admission: EM | Admit: 2014-03-19 | Discharge: 2014-03-19 | Disposition: A | Discharge status: Home / Self Care | Payer: Medicare Other | Attending: Emergency Medicine | Admitting: Emergency Medicine

## 2014-03-19 DIAGNOSIS — F410 Panic disorder [episodic paroxysmal anxiety] without agoraphobia: Secondary | ICD-10-CM | POA: Insufficient documentation

## 2014-03-19 DIAGNOSIS — J45901 Unspecified asthma with (acute) exacerbation: Secondary | ICD-10-CM | POA: Insufficient documentation

## 2014-03-19 DIAGNOSIS — Z79899 Other long term (current) drug therapy: Secondary | ICD-10-CM | POA: Diagnosis not present

## 2014-03-19 DIAGNOSIS — Z880 Allergy status to penicillin: Secondary | ICD-10-CM | POA: Insufficient documentation

## 2014-03-19 DIAGNOSIS — Z8739 Personal history of other diseases of the musculoskeletal system and connective tissue: Secondary | ICD-10-CM | POA: Diagnosis not present

## 2014-03-19 DIAGNOSIS — Z8669 Personal history of other diseases of the nervous system and sense organs: Secondary | ICD-10-CM | POA: Insufficient documentation

## 2014-03-19 DIAGNOSIS — F17200 Nicotine dependence, unspecified, uncomplicated: Secondary | ICD-10-CM | POA: Insufficient documentation

## 2014-03-19 DIAGNOSIS — R0600 Dyspnea, unspecified: Secondary | ICD-10-CM

## 2014-03-19 DIAGNOSIS — F411 Generalized anxiety disorder: Secondary | ICD-10-CM | POA: Insufficient documentation

## 2014-03-19 HISTORY — DX: Anxiety disorder, unspecified: F41.9

## 2014-03-19 LAB — CBC WITH DIFFERENTIAL/PLATELET
BASOS PCT: 0 % (ref 0–1)
Basophils Absolute: 0 10*3/uL (ref 0.0–0.1)
EOS ABS: 0 10*3/uL (ref 0.0–0.7)
EOS PCT: 0 % (ref 0–5)
HEMATOCRIT: 35.9 % — AB (ref 36.0–46.0)
HEMOGLOBIN: 11.3 g/dL — AB (ref 12.0–15.0)
LYMPHS ABS: 2.9 10*3/uL (ref 0.7–4.0)
Lymphocytes Relative: 54 % — ABNORMAL HIGH (ref 12–46)
MCH: 27.2 pg (ref 26.0–34.0)
MCHC: 31.5 g/dL (ref 30.0–36.0)
MCV: 86.5 fL (ref 78.0–100.0)
MONO ABS: 0.4 10*3/uL (ref 0.1–1.0)
MONOS PCT: 7 % (ref 3–12)
NEUTROS PCT: 39 % — AB (ref 43–77)
Neutro Abs: 2.1 10*3/uL (ref 1.7–7.7)
Platelets: 325 10*3/uL (ref 150–400)
RBC: 4.15 MIL/uL (ref 3.87–5.11)
RDW: 12.5 % (ref 11.5–15.5)
WBC: 5.3 10*3/uL (ref 4.0–10.5)

## 2014-03-19 LAB — TROPONIN I: Troponin I: <0.3 ng/mL (ref <0.30)

## 2014-03-19 LAB — BASIC METABOLIC PANEL
Anion gap: 16 — ABNORMAL HIGH (ref 5–15)
BUN: 10 mg/dL (ref 6–23)
CALCIUM: 9.5 mg/dL (ref 8.4–10.5)
CO2: 21 mEq/L (ref 19–32)
CREATININE: 0.54 mg/dL (ref 0.50–1.10)
Chloride: 100 mEq/L (ref 96–112)
GLUCOSE: 91 mg/dL (ref 70–99)
Potassium: 5.7 mEq/L — ABNORMAL HIGH (ref 3.7–5.3)
Sodium: 137 mEq/L (ref 137–147)

## 2014-03-19 MED ORDER — LORAZEPAM 2 MG/ML IJ SOLN
1.0000 mg | Freq: Once | INTRAMUSCULAR | Status: AC
  Administered 2014-03-19: 1 mg via INTRAVENOUS
  Filled 2014-03-19: qty 1

## 2014-03-19 MED ORDER — IOHEXOL 350 MG/ML SOLN
80.0000 mL | Freq: Once | INTRAVENOUS | Status: AC | PRN
  Administered 2014-03-19: 80 mL via INTRAVENOUS

## 2014-03-19 MED ORDER — SODIUM CHLORIDE 0.9 % IV SOLN
INTRAVENOUS | Status: DC
  Administered 2014-03-19: 16:00:00 via INTRAVENOUS

## 2014-03-19 MED ORDER — SODIUM CHLORIDE 0.9 % IV BOLUS (SEPSIS)
1000.0000 mL | Freq: Once | INTRAVENOUS | Status: AC
  Administered 2014-03-19: 1000 mL via INTRAVENOUS

## 2014-03-19 NOTE — ED Notes (Signed)
States that she had an anxiety attack at 4 am and she  Feels like she is not doing well and was told if she had problems trhat she could  Come back tpo ER

## 2014-03-19 NOTE — ED Provider Notes (Signed)
CSN: [id number]     Arrival date & time 03/19/14  1307 History   First MD Initiated Contact with Patient 03/19/14 1359     Chief Complaint  Patient presents with  . Panic Attack     (Consider location/radiation/quality/duration/timing/severity/associated sxs/prior Treatment) HPI Comments:   patient here complaining of sudden onset of dyspnea which began this morning when she went to walk. Similar symptoms for which she was evaluated for here 24 hours ago and diagnosed with anxiety. She denies being anxious at this time. Denies any anginal type symptoms. Says it feels as it is a tightness in her chest and she thought that maybe it was an asthma attack and used her inhaler without relief. No fever chills or cough. Denies any vomiting diarrhea. Symptoms are better activity and worse with. No leg pain or swelling. She denies any history of DVT   The history is provided by the patient.    Past Medical History  Diagnosis Date  . Cerebral palsy   . Seizure disorder   . Asthma   . Low sodium diet   . Headache(784.0)     frequently  . Seizures     last one in high school;takes Depakote daily  . Numbness     bilateral feet  . Muscle spasm     takes Flexeril daily  . Peripheral edema     occasionally  . Back pain     unknown   . Anxiety    Past Surgical History  Procedure Laterality Date  . Leg surgery Left   . Bilateral leg surgery as a child    . Cesarean section  2007  . Tooth extraction N/A 02/16/2013    Procedure: EXTRACTION 16, 17, 32;  Surgeon: Georgia LopesScott M Jensen, DDS;  Location: [facility] OR;  Service: Oral Surgery;  Laterality: N/A;   No family history on file. History  Substance Use Topics  . Smoking status: Current Every Day Smoker -- 0.25 packs/day for 7 years    Types: Cigarettes  . Smokeless tobacco: Never Used  . Alcohol Use: No   OB History   Grav Para Term Preterm Abortions TAB SAB Ect Mult Living                 Review of Systems  All other systems reviewed and are  negative.     Allergies  Codeine; Dust mite extract; Mold extract; Penicillins; and Soap  Home Medications   Prior to Admission medications   Medication Sig Start Date End Date Taking? Authorizing Provider  albuterol (PROVENTIL HFA;VENTOLIN HFA) 108 (90 BASE) MCG/ACT inhaler Inhale 2 puffs into the lungs every 6 (six) hours as needed for wheezing.    Historical Provider, MD  cyclobenzaprine (FLEXERIL) 5 MG tablet Take 5 mg by mouth 3 (three) times daily as needed for muscle spasms.    Historical Provider, MD  divalproex (DEPAKOTE ER) 250 MG 24 hr tablet Take 250 mg by mouth daily.    Historical Provider, MD  ibuprofen (ADVIL,MOTRIN) 800 MG tablet Take 800 mg by mouth every 8 (eight) hours as needed for pain.    Historical Provider, MD  loratadine (CLARITIN) 10 MG tablet Take 10 mg by mouth daily.    Historical Provider, MD  montelukast (SINGULAIR) 10 MG tablet Take 10 mg by mouth at bedtime.    Historical Provider, MD   BP 150/97  Pulse 112  Temp(Src) 97.9 F (36.6 C) (Oral)  Resp 20  Ht 5' (1.524 m)  Wt 139 lb (  63.05 kg)  BMI 27.15 kg/m2  SpO2 100%  LMP 02/17/2014 Physical Exam  Nursing note and vitals reviewed. Constitutional: She is oriented to person, place, and time. She appears well-developed and well-nourished.  Non-toxic appearance. No distress.  HENT:  Head: Normocephalic and atraumatic.  Eyes: Conjunctivae, EOM and lids are normal. Pupils are equal, round, and reactive to light.  Neck: Normal range of motion. Neck supple. No tracheal deviation present. No mass present.  Cardiovascular: Normal rate, regular rhythm and normal heart sounds.  Exam reveals no gallop.   No murmur heard. Pulmonary/Chest: Effort normal and breath sounds normal. No stridor. No respiratory distress. She has no decreased breath sounds. She has no wheezes. She has no rhonchi. She has no rales.  Abdominal: Soft. Normal appearance and bowel sounds are normal. She exhibits no distension. There is no  tenderness. There is no rebound and no CVA tenderness.  Musculoskeletal: Normal range of motion. She exhibits no edema and no tenderness.  Neurological: She is alert and oriented to person, place, and time. She has normal strength. No cranial nerve deficit or sensory deficit. GCS eye subscore is 4. GCS verbal subscore is 5. GCS motor subscore is 6.  Skin: Skin is warm and dry. No abrasion and no rash noted.  Psychiatric: Her speech is normal and behavior is normal. Her mood appears anxious.    ED Course  Procedures (including critical care time) Labs Review Labs Reviewed  CBC WITH DIFFERENTIAL  BASIC METABOLIC PANEL  TROPONIN I    Imaging Review Dg Chest 2 View  03/18/2014   CLINICAL DATA:  Chest pain  EXAM: CHEST  2 VIEW  COMPARISON:  May 10, 2004  FINDINGS: Lungs are clear. Heart size and pulmonary vascularity are normal. No pneumothorax. No adenopathy. No bone lesions.  IMPRESSION: No edema or consolidation.   Electronically Signed   By: Bretta Bang M.D.   On: 03/18/2014 13:51     EKG Interpretation   Date/Time:  Friday March 19 2014 14:32:44 EDT Ventricular Rate:  102 PR Interval:  104 QRS Duration: 70 QT Interval:  324 QTC Calculation: 422 R Axis:   80 Text Interpretation:  Sinus tachycardia Confirmed by Freida Busman  MD, Kevron Patella  ([id number]) on 03/19/2014 6:31:45 PM      MDM   Final diagnoses:  None    Patient given IV fluids and Ativan here and heart rate has improved. She does appear to be anxious at this time and we'll speak with her RA goes out to have her calm down and goes down. CT of the chest negative for PE. Patient's hemoglobin is stable. Do not think that her current symptoms represent a pulmonary or cardiac etiology. Patient's potassium noted and felt to be due to hemolysis. Suspect is some component of anxiety. She is stable for discharge    Toy Baker, MD 03/19/14 [phone]

## 2014-03-19 NOTE — Discharge Instructions (Signed)

## 2014-04-16 ENCOUNTER — Encounter (HOSPITAL_COMMUNITY): Payer: Self-pay | Admitting: Emergency Medicine

## 2014-04-16 ENCOUNTER — Emergency Department (HOSPITAL_COMMUNITY)
Admission: EM | Admit: 2014-04-16 | Discharge: 2014-04-16 | Disposition: A | Discharge status: Home / Self Care | Payer: Medicare Other | Attending: Emergency Medicine | Admitting: Emergency Medicine

## 2014-04-16 DIAGNOSIS — M545 Low back pain, unspecified: Secondary | ICD-10-CM | POA: Insufficient documentation

## 2014-04-16 DIAGNOSIS — F17200 Nicotine dependence, unspecified, uncomplicated: Secondary | ICD-10-CM | POA: Diagnosis not present

## 2014-04-16 DIAGNOSIS — G40909 Epilepsy, unspecified, not intractable, without status epilepticus: Secondary | ICD-10-CM | POA: Insufficient documentation

## 2014-04-16 DIAGNOSIS — Z79899 Other long term (current) drug therapy: Secondary | ICD-10-CM | POA: Insufficient documentation

## 2014-04-16 DIAGNOSIS — M5380 Other specified dorsopathies, site unspecified: Secondary | ICD-10-CM | POA: Insufficient documentation

## 2014-04-16 DIAGNOSIS — J45909 Unspecified asthma, uncomplicated: Secondary | ICD-10-CM | POA: Insufficient documentation

## 2014-04-16 DIAGNOSIS — Z8659 Personal history of other mental and behavioral disorders: Secondary | ICD-10-CM | POA: Insufficient documentation

## 2014-04-16 DIAGNOSIS — M5430 Sciatica, unspecified side: Secondary | ICD-10-CM | POA: Insufficient documentation

## 2014-04-16 DIAGNOSIS — M5441 Lumbago with sciatica, right side: Secondary | ICD-10-CM

## 2014-04-16 MED ORDER — DIAZEPAM 5 MG PO TABS
5.0000 mg | ORAL_TABLET | Freq: Once | ORAL | Status: AC
  Administered 2014-04-16: 5 mg via ORAL
  Filled 2014-04-16: qty 1

## 2014-04-16 MED ORDER — DIAZEPAM 5 MG PO TABS: 5.0000 mg | ORAL_TABLET | Freq: Four times a day (QID) | ORAL | Status: DC | PRN

## 2014-04-16 MED ORDER — HYDROMORPHONE HCL PF 1 MG/ML IJ SOLN
1.0000 mg | Freq: Once | INTRAMUSCULAR | Status: AC
  Administered 2014-04-16: 1 mg via INTRAMUSCULAR
  Filled 2014-04-16: qty 1

## 2014-04-16 MED ORDER — OXYCODONE-ACETAMINOPHEN 5-325 MG PO TABS: 1.0000 | ORAL_TABLET | Freq: Four times a day (QID) | ORAL | Status: DC | PRN

## 2014-04-16 NOTE — ED Provider Notes (Signed)
CSN: [id number]     Arrival date & time 04/16/14  1702 History   First MD Initiated Contact with Patient 04/16/14 1931     Chief Complaint  Patient presents with  . back spasms    . Back Pain     (Consider location/radiation/quality/duration/timing/severity/associated sxs/prior Treatment) Patient is a 28 y.o. female presenting with back pain. The history is provided by the patient.  Back Pain Location:  Lumbar spine Quality:  Stabbing and shooting Radiates to:  R posterior upper leg Pain severity:  Moderate Pain is:  Same all the time Onset quality:  Sudden Timing:  Constant Progression:  Unchanged Chronicity:  New Context: physical stress (walking with groceries)   Relieved by:  Nothing Worsened by:  Nothing tried Associated symptoms: no abdominal pain and no fever     Past Medical History  Diagnosis Date  . Cerebral palsy   . Seizure disorder   . Asthma   . Low sodium diet   . Headache(784.0)     frequently  . Seizures     last one in high school;takes Depakote daily  . Numbness     bilateral feet  . Muscle spasm     takes Flexeril daily  . Peripheral edema     occasionally  . Back pain     unknown   . Anxiety    Past Surgical History  Procedure Laterality Date  . Leg surgery Left   . Bilateral leg surgery as a child    . Cesarean section  2007  . Tooth extraction N/A 02/16/2013    Procedure: EXTRACTION 16, 17, 32;  Surgeon: Georgia Lopes, DDS;  Location: [facility] OR;  Service: Oral Surgery;  Laterality: N/A;   No family history on file. History  Substance Use Topics  . Smoking status: Current Every Day Smoker -- 0.25 packs/day for 7 years    Types: Cigarettes  . Smokeless tobacco: Never Used  . Alcohol Use: No   OB History   Grav Para Term Preterm Abortions TAB SAB Ect Mult Living                 Review of Systems  Constitutional: Negative for fever.  Respiratory: Negative for cough and shortness of breath.   Gastrointestinal: Negative for vomiting  and abdominal pain.  Musculoskeletal: Positive for back pain.  All other systems reviewed and are negative.     Allergies  Codeine; Dust mite extract; Mold extract; Penicillins; and Soap  Home Medications   Prior to Admission medications   Medication Sig Start Date End Date Taking? Authorizing Provider  divalproex (DEPAKOTE ER) 250 MG 24 hr tablet Take 250 mg by mouth daily.   Yes Historical Provider, MD  fluticasone (FLONASE) 50 MCG/ACT nasal spray Place 1 spray into both nostrils daily as needed for allergies or rhinitis.   Yes Historical Provider, MD  ibuprofen (ADVIL,MOTRIN) 800 MG tablet Take 800 mg by mouth every 8 (eight) hours as needed for pain.   Yes Historical Provider, MD  loratadine (CLARITIN) 10 MG tablet Take 10 mg by mouth daily.   Yes Historical Provider, MD  montelukast (SINGULAIR) 10 MG tablet Take 10 mg by mouth at bedtime.   Yes Historical Provider, MD  Norgestimate-Ethinyl Estradiol Triphasic (ORTHO TRI-CYCLEN LO) 0.18/0.215/0.25 MG-25 MCG tab Take 1 tablet by mouth daily.   Yes Historical Provider, MD  albuterol (PROVENTIL HFA;VENTOLIN HFA) 108 (90 BASE) MCG/ACT inhaler Inhale 2-3 puffs into the lungs every 6 (six) hours as needed for wheezing.  Historical Provider, MD   BP 135/75  Pulse 90  Temp(Src) 97.8 F (36.6 C) (Oral)  Resp 19  SpO2 100%  LMP 03/25/2014 Physical Exam  Nursing note and vitals reviewed. Constitutional: She is oriented to person, place, and time. She appears well-developed and well-nourished. No distress.  HENT:  Head: Normocephalic and atraumatic.  Mouth/Throat: Oropharynx is clear and moist.  Eyes: EOM are normal. Pupils are equal, round, and reactive to light.  Neck: Normal range of motion. Neck supple.  Cardiovascular: Normal rate and regular rhythm.  Exam reveals no friction rub.   No murmur heard. Pulmonary/Chest: Effort normal and breath sounds normal. No respiratory distress. She has no wheezes. She has no rales.   Abdominal: Soft. She exhibits no distension. There is no tenderness. There is no rebound.  Musculoskeletal: Normal range of motion. She exhibits no edema.       Lumbar back: She exhibits tenderness (diffuse). She exhibits no spasm.  Neurological: She is alert and oriented to person, place, and time. No cranial nerve deficit. She exhibits normal muscle tone. Coordination normal.  Skin: She is not diaphoretic.    ED Course  Procedures (including critical care time) Labs Review Labs Reviewed - No data to display  Imaging Review No results found.   EKG Interpretation None      MDM   Final diagnoses:  Right-sided low back pain with right-sided sciatica    28 year old female with history of cerebral palsy presents with lower back pain. She does have some muscle spasms which she takes Flexeril 4. Was walking home from getting groceries and started having back pain radiating down her right leg. Denies any incontinence, retention of urine or stool. No saddle anesthesia. Moving all extremities, good strength and sensation. On exam has diffuse lower back pain with palpation. Will give Dilaudid and Valium for symptom relief. I suspect she has sciatica pain. Pain improved after meds, eating and relaxing comfortably. Stable for discharge.  Elwin Mocha, MD 04/16/14 [phone]

## 2014-04-16 NOTE — Discharge Instructions (Signed)
Sciatica Sciatica is pain, weakness, numbness, or tingling along the path of the sciatic nerve. The nerve starts in the lower back and runs down the back of each leg. The nerve controls the muscles in the lower leg and in the back of the knee, while also providing sensation to the back of the thigh, lower leg, and the sole of your foot. Sciatica is a symptom of another medical condition. For instance, nerve damage or certain conditions, such as a herniated disk or bone spur on the spine, pinch or put pressure on the sciatic nerve. This causes the pain, weakness, or other sensations normally associated with sciatica. Generally, sciatica only affects one side of the body. CAUSES   Herniated or slipped disc.  Degenerative disk disease.  A pain disorder involving the narrow muscle in the buttocks (piriformis syndrome).  Pelvic injury or fracture.  Pregnancy.  Tumor (rare). SYMPTOMS  Symptoms can vary from mild to very severe. The symptoms usually travel from the low back to the buttocks and down the back of the leg. Symptoms can include:  Mild tingling or dull aches in the lower back, leg, or hip.  Numbness in the back of the calf or sole of the foot.  Burning sensations in the lower back, leg, or hip.  Sharp pains in the lower back, leg, or hip.  Leg weakness.  Severe back pain inhibiting movement. These symptoms may get worse with coughing, sneezing, laughing, or prolonged sitting or standing. Also, being overweight may worsen symptoms. DIAGNOSIS  Your caregiver will perform a physical exam to look for common symptoms of sciatica. He or she may ask you to do certain movements or activities that would trigger sciatic nerve pain. Other tests may be performed to find the cause of the sciatica. These may include:  Blood tests.  X-rays.  Imaging tests, such as an MRI or CT scan. TREATMENT  Treatment is directed at the cause of the sciatic pain. Sometimes, treatment is not necessary  and the pain and discomfort goes away on its own. If treatment is needed, your caregiver may suggest:  Over-the-counter medicines to relieve pain.  Prescription medicines, such as anti-inflammatory medicine, muscle relaxants, or narcotics.  Applying heat or ice to the painful area.  Steroid injections to lessen pain, irritation, and inflammation around the nerve.  Reducing activity during periods of pain.  Exercising and stretching to strengthen your abdomen and improve flexibility of your spine. Your caregiver may suggest losing weight if the extra weight makes the back pain worse.  Physical therapy.  Surgery to eliminate what is pressing or pinching the nerve, such as a bone spur or part of a herniated disk. HOME CARE INSTRUCTIONS   Only take over-the-counter or prescription medicines for pain or discomfort as directed by your caregiver.  Apply ice to the affected area for 20 minutes, 3-4 times a day for the first 48-72 hours. Then try heat in the same way.  Exercise, stretch, or perform your usual activities if these do not aggravate your pain.  Attend physical therapy sessions as directed by your caregiver.  Keep all follow-up appointments as directed by your caregiver.  Do not wear high heels or shoes that do not provide proper support.  Check your mattress to see if it is too soft. A firm mattress may lessen your pain and discomfort. SEEK IMMEDIATE MEDICAL CARE IF:   You lose control of your bowel or bladder (incontinence).  You have increasing weakness in the lower back, pelvis, buttocks,   or legs.  You have redness or swelling of your back.  You have a burning sensation when you urinate.  You have pain that gets worse when you lie down or awakens you at night.  Your pain is worse than you have experienced in the past.  Your pain is lasting longer than 4 weeks.  You are suddenly losing weight without reason. MAKE SURE YOU:  Understand these  instructions.  Will watch your condition.  Will get help right away if you are not doing well or get worse. Document Released: 07/17/2001 Document Revised: 01/22/2012 Document Reviewed: 12/02/2011 [employer] Patient Information 2015 [employer]. This information is not intended to replace advice given to you by your health care provider. Make sure you discuss any questions you have with your health care provider.  

## 2014-04-16 NOTE — ED Notes (Signed)
Per EMS pt comes from home with back spasims that affecting her legs.  Pt does have cerebral palsy.

## 2014-05-21 DIAGNOSIS — M545 Low back pain, unspecified: Secondary | ICD-10-CM

## 2014-05-21 HISTORY — DX: Low back pain, unspecified: M54.50

## 2014-07-22 ENCOUNTER — Emergency Department (HOSPITAL_COMMUNITY)
Admission: EM | Admit: 2014-07-22 | Discharge: 2014-07-23 | Disposition: A | Discharge status: Home / Self Care | Payer: Medicare Other | Attending: Emergency Medicine | Admitting: Emergency Medicine

## 2014-07-22 ENCOUNTER — Encounter (HOSPITAL_COMMUNITY): Payer: Self-pay

## 2014-07-22 ENCOUNTER — Emergency Department (HOSPITAL_COMMUNITY): Payer: Medicare Other

## 2014-07-22 DIAGNOSIS — R1084 Generalized abdominal pain: Secondary | ICD-10-CM | POA: Insufficient documentation

## 2014-07-22 DIAGNOSIS — D649 Anemia, unspecified: Secondary | ICD-10-CM

## 2014-07-22 DIAGNOSIS — Z880 Allergy status to penicillin: Secondary | ICD-10-CM | POA: Diagnosis not present

## 2014-07-22 DIAGNOSIS — O9989 Other specified diseases and conditions complicating pregnancy, childbirth and the puerperium: Secondary | ICD-10-CM | POA: Insufficient documentation

## 2014-07-22 DIAGNOSIS — O99341 Other mental disorders complicating pregnancy, first trimester: Secondary | ICD-10-CM | POA: Insufficient documentation

## 2014-07-22 DIAGNOSIS — F419 Anxiety disorder, unspecified: Secondary | ICD-10-CM | POA: Diagnosis not present

## 2014-07-22 DIAGNOSIS — Z9889 Other specified postprocedural states: Secondary | ICD-10-CM | POA: Diagnosis not present

## 2014-07-22 DIAGNOSIS — O210 Mild hyperemesis gravidarum: Secondary | ICD-10-CM | POA: Insufficient documentation

## 2014-07-22 DIAGNOSIS — Z793 Long term (current) use of hormonal contraceptives: Secondary | ICD-10-CM | POA: Insufficient documentation

## 2014-07-22 DIAGNOSIS — O99011 Anemia complicating pregnancy, first trimester: Secondary | ICD-10-CM | POA: Insufficient documentation

## 2014-07-22 DIAGNOSIS — G40909 Epilepsy, unspecified, not intractable, without status epilepticus: Secondary | ICD-10-CM | POA: Insufficient documentation

## 2014-07-22 DIAGNOSIS — O99511 Diseases of the respiratory system complicating pregnancy, first trimester: Secondary | ICD-10-CM | POA: Diagnosis not present

## 2014-07-22 DIAGNOSIS — O99351 Diseases of the nervous system complicating pregnancy, first trimester: Secondary | ICD-10-CM | POA: Diagnosis not present

## 2014-07-22 DIAGNOSIS — F17200 Nicotine dependence, unspecified, uncomplicated: Secondary | ICD-10-CM | POA: Diagnosis not present

## 2014-07-22 DIAGNOSIS — Z8739 Personal history of other diseases of the musculoskeletal system and connective tissue: Secondary | ICD-10-CM | POA: Diagnosis not present

## 2014-07-22 DIAGNOSIS — R109 Unspecified abdominal pain: Secondary | ICD-10-CM

## 2014-07-22 DIAGNOSIS — O99331 Smoking (tobacco) complicating pregnancy, first trimester: Secondary | ICD-10-CM | POA: Insufficient documentation

## 2014-07-22 DIAGNOSIS — Z79899 Other long term (current) drug therapy: Secondary | ICD-10-CM | POA: Insufficient documentation

## 2014-07-22 DIAGNOSIS — O26899 Other specified pregnancy related conditions, unspecified trimester: Secondary | ICD-10-CM

## 2014-07-22 DIAGNOSIS — Z3A01 Less than 8 weeks gestation of pregnancy: Secondary | ICD-10-CM | POA: Insufficient documentation

## 2014-07-22 DIAGNOSIS — Z3491 Encounter for supervision of normal pregnancy, unspecified, first trimester: Secondary | ICD-10-CM

## 2014-07-22 DIAGNOSIS — J45909 Unspecified asthma, uncomplicated: Secondary | ICD-10-CM | POA: Diagnosis not present

## 2014-07-22 LAB — URINALYSIS, ROUTINE W REFLEX MICROSCOPIC
GLUCOSE, UA: NEGATIVE mg/dL
Hgb urine dipstick: NEGATIVE
KETONES UR: NEGATIVE mg/dL
Leukocytes, UA: NEGATIVE
Nitrite: NEGATIVE
PH: 6 (ref 5.0–8.0)
Protein, ur: NEGATIVE mg/dL
Specific Gravity, Urine: 1.027 (ref 1.005–1.030)
Urobilinogen, UA: 4 mg/dL — ABNORMAL HIGH (ref 0.0–1.0)

## 2014-07-22 LAB — CBC WITH DIFFERENTIAL/PLATELET
Basophils Absolute: 0 10*3/uL (ref 0.0–0.1)
Basophils Relative: 0 % (ref 0–1)
EOS ABS: 0 10*3/uL (ref 0.0–0.7)
Eosinophils Relative: 0 % (ref 0–5)
HCT: 30.3 % — ABNORMAL LOW (ref 36.0–46.0)
HEMOGLOBIN: 9.7 g/dL — AB (ref 12.0–15.0)
LYMPHS PCT: 36 % (ref 12–46)
Lymphs Abs: 2.6 10*3/uL (ref 0.7–4.0)
MCH: 27.7 pg (ref 26.0–34.0)
MCHC: 32 g/dL (ref 30.0–36.0)
MCV: 86.6 fL (ref 78.0–100.0)
MONOS PCT: 7 % (ref 3–12)
Monocytes Absolute: 0.5 10*3/uL (ref 0.1–1.0)
NEUTROS PCT: 57 % (ref 43–77)
Neutro Abs: 4.1 10*3/uL (ref 1.7–7.7)
PLATELETS: 402 10*3/uL — AB (ref 150–400)
RBC: 3.5 MIL/uL — AB (ref 3.87–5.11)
RDW: 12.7 % (ref 11.5–15.5)
WBC: 7.2 10*3/uL (ref 4.0–10.5)

## 2014-07-22 LAB — COMPREHENSIVE METABOLIC PANEL
ALT: 12 U/L (ref 0–35)
AST: 20 U/L (ref 0–37)
Albumin: 3.4 g/dL — ABNORMAL LOW (ref 3.5–5.2)
Alkaline Phosphatase: 80 U/L (ref 39–117)
Anion gap: 15 (ref 5–15)
BUN: 10 mg/dL (ref 6–23)
CALCIUM: 9.6 mg/dL (ref 8.4–10.5)
CO2: 22 mEq/L (ref 19–32)
CREATININE: 0.5 mg/dL (ref 0.50–1.10)
Chloride: 98 mEq/L (ref 96–112)
GFR calc Af Amer: >90 mL/min (ref >90)
GFR calc non Af Amer: >90 mL/min (ref >90)
Glucose, Bld: 104 mg/dL — ABNORMAL HIGH (ref 70–99)
Potassium: 3.6 mEq/L — ABNORMAL LOW (ref 3.7–5.3)
SODIUM: 135 meq/L — AB (ref 137–147)
Total Bilirubin: 0.3 mg/dL (ref 0.3–1.2)
Total Protein: 7.9 g/dL (ref 6.0–8.3)

## 2014-07-22 LAB — ABO/RH: ABO/RH(D): A POS

## 2014-07-22 LAB — LIPASE, BLOOD: LIPASE: 27 U/L (ref 11–59)

## 2014-07-22 LAB — PREGNANCY, URINE: Preg Test, Ur: POSITIVE — AB

## 2014-07-22 LAB — VALPROIC ACID LEVEL: Valproic Acid Lvl: <10 ug/mL — ABNORMAL LOW (ref 50.0–100.0)

## 2014-07-22 MED ORDER — SODIUM CHLORIDE 0.9 % IV BOLUS (SEPSIS)
1000.0000 mL | Freq: Once | INTRAVENOUS | Status: AC
  Administered 2014-07-22: 1000 mL via INTRAVENOUS

## 2014-07-22 MED ORDER — ONDANSETRON HCL 4 MG/2ML IJ SOLN
4.0000 mg | Freq: Once | INTRAMUSCULAR | Status: AC
  Administered 2014-07-22: 4 mg via INTRAVENOUS
  Filled 2014-07-22: qty 2

## 2014-07-22 MED ORDER — PROMETHAZINE HCL 25 MG PO TABS: 25.0000 mg | ORAL_TABLET | Freq: Four times a day (QID) | ORAL | Status: DC | PRN

## 2014-07-22 NOTE — Discharge Instructions (Signed)
You are 6 weeks and 2 days pregnant on ultrasound.  Follow up with your doctor tomorrow for recheck. You can follow up with your OBGYN or the women's center for your pregnancy.  Take a prenatal vitamin with iron, available over the counter for anemia.     Abdominal Pain During Pregnancy Abdominal pain is common in pregnancy. Most of the time, it does not cause harm. There are many causes of abdominal pain. Some causes are more serious than others. Some of the causes of abdominal pain in pregnancy are easily diagnosed. Occasionally, the diagnosis takes time to understand. Other times, the cause is not determined. Abdominal pain can be a sign that something is very wrong with the pregnancy, or the pain may have nothing to do with the pregnancy at all. For this reason, always tell your health care provider if you have any abdominal discomfort. HOME CARE INSTRUCTIONS  Monitor your abdominal pain for any changes. The following actions may help to alleviate any discomfort you are experiencing:  Do not have sexual intercourse or put anything in your vagina until your symptoms go away completely.  Get plenty of rest until your pain improves.  Drink clear fluids if you feel nauseous. Avoid solid food as long as you are uncomfortable or nauseous.  Only take over-the-counter or prescription medicine as directed by your health care provider.  Keep all follow-up appointments with your health care provider. SEEK IMMEDIATE MEDICAL CARE IF:  You are bleeding, leaking fluid, or passing tissue from the vagina.  You have increasing pain or cramping.  You have persistent vomiting.  You have painful or bloody urination.  You have a fever.  You notice a decrease in your baby's movements.  You have extreme weakness or feel faint.  You have shortness of breath, with or without abdominal pain.  You develop a severe headache with abdominal pain.  You have abnormal vaginal discharge with abdominal  pain.  You have persistent diarrhea.  You have abdominal pain that continues even after rest, or gets worse. MAKE SURE YOU:   Understand these instructions.  Will watch your condition.  Will get help right away if you are not doing well or get worse. Document Released: 07/23/2005 Document Revised: 05/13/2013 Document Reviewed: 02/19/2013 [facility] Patient Information 2015 [place], [place]. This information is not intended to replace advice given to you by your health care provider. Make sure you discuss any questions you have with your health care provider.

## 2014-07-22 NOTE — ED Notes (Signed)
Per [facility]- Pt c/o N/V/ and generalized stomach pain x 4 days. Pt's zofran taken before EMS arrival. Denies urinary problems. Pt c/op of tenderness to palpation. Pt did not vomit in route

## 2014-07-22 NOTE — ED Notes (Signed)
Bed: WA07 Expected date:  Expected time:  Means of arrival:  Comments: Cerebral palsy, NVD

## 2014-07-22 NOTE — ED Provider Notes (Signed)
CSN: [id number]     Arrival date & time 07/22/14  1906 History   First MD Initiated Contact with Patient 07/22/14 1907     Chief Complaint  Patient presents with  . Nausea  . Emesis  . Abdominal Pain    Patient is a 28 y.o. female presenting with vomiting and abdominal pain. The history is provided by the patient. No language interpreter was used.  Emesis Associated symptoms: abdominal pain   Abdominal Pain Associated symptoms: vomiting    Dana Parks presents for evaluation of vomiting and abdominal pain. For the last 4 days she has had generalized abdominal pain described as pain. Pain is worse when she gets waves of nausea. She's had daily vomiting since this began. She vomited a small amount of blood yesterday. She has continued vomiting today but there is no blood in her vomit. She denies any diarrhea or constipation. Her last bowel movement was 3 days ago. She denies fevers. She has been unable to take her home meds today. She has a history of seizure disorder. She denies any dysuria or vaginal discharge. She thinks she may be pregnant. Symptoms are moderate, constant, worsening.  Past Medical History  Diagnosis Date  . Cerebral palsy   . Seizure disorder   . Asthma   . Low sodium diet   . Headache(784.0)     frequently  . Seizures     last one in high school;takes Depakote daily  . Numbness     bilateral feet  . Muscle spasm     takes Flexeril daily  . Peripheral edema     occasionally  . Back pain     unknown   . Anxiety    Past Surgical History  Procedure Laterality Date  . Leg surgery Left   . Bilateral leg surgery as a child    . Cesarean section  2007  . Tooth extraction N/A 02/16/2013    Procedure: EXTRACTION 16, 17, 32;  Surgeon: Georgia LopesScott M Jensen, DDS;  Location: [facility] OR;  Service: Oral Surgery;  Laterality: N/A;   No family history on file. History  Substance Use Topics  . Smoking status: Current Every Day Smoker -- 0.25 packs/day for 7 years    Types:  Cigarettes  . Smokeless tobacco: Never Used  . Alcohol Use: No   OB History    No data available     Review of Systems  Gastrointestinal: Positive for vomiting and abdominal pain.  All other systems reviewed and are negative.     Allergies  Codeine; Dust mite extract; Mold extract; Penicillins; and Soap  Home Medications   Prior to Admission medications   Medication Sig Start Date End Date Taking? Authorizing Provider  albuterol (PROVENTIL HFA;VENTOLIN HFA) 108 (90 BASE) MCG/ACT inhaler Inhale 2-3 puffs into the lungs every 6 (six) hours as needed for wheezing.     Historical Provider, MD  diazepam (VALIUM) 5 MG tablet Take 1 tablet (5 mg total) by mouth every 6 (six) hours as needed for muscle spasms. 04/16/14   Elwin MochaBlair Walden, MD  divalproex (DEPAKOTE ER) 250 MG 24 hr tablet Take 250 mg by mouth daily.    Historical Provider, MD  fluticasone (FLONASE) 50 MCG/ACT nasal spray Place 1 spray into both nostrils daily as needed for allergies or rhinitis.    Historical Provider, MD  ibuprofen (ADVIL,MOTRIN) 800 MG tablet Take 800 mg by mouth every 8 (eight) hours as needed for pain.    Historical Provider, MD  loratadine (CLARITIN)  10 MG tablet Take 10 mg by mouth daily.    Historical Provider, MD  montelukast (SINGULAIR) 10 MG tablet Take 10 mg by mouth at bedtime.    Historical Provider, MD  Norgestimate-Ethinyl Estradiol Triphasic (ORTHO TRI-CYCLEN LO) 0.18/0.215/0.25 MG-25 MCG tab Take 1 tablet by mouth daily.    Historical Provider, MD  oxyCODONE-acetaminophen (PERCOCET) 5-325 MG per tablet Take 1 tablet by mouth every 6 (six) hours as needed. 04/16/14   Elwin Mocha, MD   BP 123/76 mmHg  Pulse 98  Temp(Src) 98.7 F (37.1 C) (Oral)  Resp 16  SpO2 100% Physical Exam  Constitutional: She is oriented to person, place, and time. She appears well-developed and well-nourished.  HENT:  Head: Normocephalic and atraumatic.  Cardiovascular: Normal rate and regular rhythm.   No murmur  heard. Pulmonary/Chest: Effort normal and breath sounds normal. No respiratory distress.  Abdominal: Soft. There is no rebound and no guarding.  Moderate diffuse tenderness without guarding or rebound  Musculoskeletal: She exhibits no edema or tenderness.  Neurological: She is alert and oriented to person, place, and time.  Skin: Skin is warm and dry.  Psychiatric: She has a normal mood and affect. Her behavior is normal.  Nursing note and vitals reviewed.   ED Course  Procedures (including critical care time) Labs Review Labs Reviewed  COMPREHENSIVE METABOLIC PANEL - Abnormal; Notable for the following:    Sodium 135 (*)    Potassium 3.6 (*)    Glucose, Bld 104 (*)    Albumin 3.4 (*)    All other components within normal limits  CBC WITH DIFFERENTIAL - Abnormal; Notable for the following:    RBC 3.50 (*)    Hemoglobin 9.7 (*)    HCT 30.3 (*)    Platelets 402 (*)    All other components within normal limits  PREGNANCY, URINE - Abnormal; Notable for the following:    Preg Test, Ur POSITIVE (*)    All other components within normal limits  URINALYSIS, ROUTINE W REFLEX MICROSCOPIC - Abnormal; Notable for the following:    Color, Urine AMBER (*)    Bilirubin Urine SMALL (*)    Urobilinogen, UA 4.0 (*)    All other components within normal limits  VALPROIC ACID LEVEL - Abnormal; Notable for the following:    Valproic Acid Lvl <10.0 (*)    All other components within normal limits  LIPASE, BLOOD  HCG, QUANTITATIVE, PREGNANCY  ABO/RH    Imaging Review US Ob Comp Less 14 Wks  07/22/2014   CLINICAL DATA:  Abdominal pain and first trimester pregnancy  EXAM: OBSTETRIC <14 WK [place] AND TRANSVAGINAL OB US  TECHNIQUE: Both transabdominal and transvaginal ultrasound examinations were performed for complete evaluation of the gestation as well as the maternal uterus, adnexal regions, and pelvic cul-de-sac. Transvaginal technique was performed to assess early pregnancy.  COMPARISON:  None of  this gestation  FINDINGS: Intrauterine gestational sac: Present  Yolk sac:  Present  Embryo:  Present  Cardiac Activity: Present  Heart Rate:  116 bpm  CRL:   6  mm   6 w 2 d                  [place] EDC: 03/15/2015  Maternal uterus/adnexae: A small subchorionic hemorrhage is noted, 13 x 6 x 8 mm. The left ovary is visualized and is normal. The right ovary was not visible. No free pelvic fluid.  IMPRESSION: 1. Single, living intrauterine gestation with estimated age 79 weeks 2 days.  2. Small subchronic hemorrhage, 13 x 6 x 8 mm.   Electronically Signed   By: Tiburcio PeaJonathan  Watts M.D.   On: 07/22/2014 23:32   [place] Ob Transvaginal  07/22/2014   CLINICAL DATA:  Abdominal pain and first trimester pregnancy  EXAM: OBSTETRIC <14 WK US AND TRANSVAGINAL OB US  TECHNIQUE: Both transabdominal and transvaginal ultrasound examinations were performed for complete evaluation of the gestation as well as the maternal uterus, adnexal regions, and pelvic cul-de-sac. Transvaginal technique was performed to assess early pregnancy.  COMPARISON:  None of this gestation  FINDINGS: Intrauterine gestational sac: Present  Yolk sac:  Present  Embryo:  Present  Cardiac Activity: Present  Heart Rate:  116 bpm  CRL:   6  mm   6 w 2 d                  [place] EDC: 03/15/2015  Maternal uterus/adnexae: A small subchorionic hemorrhage is noted, 13 x 6 x 8 mm. The left ovary is visualized and is normal. The right ovary was not visible. No free pelvic fluid.  IMPRESSION: 1. Single, living intrauterine gestation with estimated age 54 weeks 2 days. 2. Small subchronic hemorrhage, 13 x 6 x 8 mm.   Electronically Signed   By: Tiburcio PeaJonathan  Watts M.D.   On: 07/22/2014 23:32     EKG Interpretation None      MDM   Final diagnoses:  Abdominal pain in pregnancy  First trimester pregnancy  Anemia, unspecified anemia type    Pt here for evaluation of abdominal pain and vomiting.  Pt is pregnant, new diagnosis in the Emergency Department.  Clinical picture not c/w  SBO, appendicitis, cholecystitis.  UA not c/w UTI.  Pt without vomiting in the ED.  Discussed pcp followup and return precautions.      Tilden FossaElizabeth Maevis Mumby, MD 07/22/14 [phone]

## 2014-07-22 NOTE — ED Notes (Signed)
Pt arrived to the ED with a complaint of nausea with emesis.  Pt states that she has had 7 episodes of emesis in the last 24 hours.  Pt states she feels the last episode of emesis was blood tinged.

## 2014-07-23 LAB — HCG, QUANTITATIVE, PREGNANCY: HCG, BETA CHAIN, QUANT, S: 50348 m[IU]/mL — AB (ref <5)

## 2014-08-18 ENCOUNTER — Telehealth: Payer: Self-pay | Admitting: General Practice

## 2014-08-18 NOTE — Telephone Encounter (Signed)
Patient called and left message stating she needs to be seen immediately. [facility] She is disabled due to multiple health problems and her last pregnancy was high risk. Please call back. Called patient back and informed her that she has already had an ultrasound done back in December where they were able to see baby, so no other ultrasound is needed at this time. Told patient I will let our front office know and they will contact her with an appt. Patient verbalized understanding and had no other questions.

## 2014-08-30 ENCOUNTER — Encounter: Payer: Self-pay | Admitting: Obstetrics & Gynecology

## 2014-08-30 ENCOUNTER — Encounter: Payer: Medicare Other | Admitting: Obstetrics & Gynecology

## 2014-09-09 ENCOUNTER — Encounter: Payer: Self-pay | Admitting: Obstetrics & Gynecology

## 2014-09-09 ENCOUNTER — Ambulatory Visit (INDEPENDENT_AMBULATORY_CARE_PROVIDER_SITE_OTHER): Payer: Medicare Other | Admitting: Obstetrics & Gynecology

## 2014-09-09 ENCOUNTER — Other Ambulatory Visit (HOSPITAL_COMMUNITY)
Admission: RE | Admit: 2014-09-09 | Discharge: 2014-09-09 | Disposition: A | Discharge status: Home / Self Care | Payer: Medicare Other | Source: Ambulatory Visit | Attending: Obstetrics & Gynecology | Admitting: Obstetrics & Gynecology

## 2014-09-09 VITALS — BP 125/85 | HR 105 | Ht <= 58 in | Wt 140.6 lb

## 2014-09-09 DIAGNOSIS — O3421 Maternal care for scar from previous cesarean delivery: Secondary | ICD-10-CM

## 2014-09-09 DIAGNOSIS — O99519 Diseases of the respiratory system complicating pregnancy, unspecified trimester: Secondary | ICD-10-CM

## 2014-09-09 DIAGNOSIS — Z124 Encounter for screening for malignant neoplasm of cervix: Secondary | ICD-10-CM

## 2014-09-09 DIAGNOSIS — Z01419 Encounter for gynecological examination (general) (routine) without abnormal findings: Secondary | ICD-10-CM | POA: Insufficient documentation

## 2014-09-09 DIAGNOSIS — O219 Vomiting of pregnancy, unspecified: Secondary | ICD-10-CM

## 2014-09-09 DIAGNOSIS — Z113 Encounter for screening for infections with a predominantly sexual mode of transmission: Secondary | ICD-10-CM

## 2014-09-09 DIAGNOSIS — O9989 Other specified diseases and conditions complicating pregnancy, childbirth and the puerperium: Secondary | ICD-10-CM

## 2014-09-09 DIAGNOSIS — G40909 Epilepsy, unspecified, not intractable, without status epilepticus: Secondary | ICD-10-CM | POA: Insufficient documentation

## 2014-09-09 DIAGNOSIS — Z118 Encounter for screening for other infectious and parasitic diseases: Secondary | ICD-10-CM

## 2014-09-09 DIAGNOSIS — O34219 Maternal care for unspecified type scar from previous cesarean delivery: Secondary | ICD-10-CM | POA: Insufficient documentation

## 2014-09-09 DIAGNOSIS — O99350 Diseases of the nervous system complicating pregnancy, unspecified trimester: Secondary | ICD-10-CM

## 2014-09-09 DIAGNOSIS — O0992 Supervision of high risk pregnancy, unspecified, second trimester: Secondary | ICD-10-CM

## 2014-09-09 DIAGNOSIS — G809 Cerebral palsy, unspecified: Secondary | ICD-10-CM | POA: Insufficient documentation

## 2014-09-09 DIAGNOSIS — J45909 Unspecified asthma, uncomplicated: Secondary | ICD-10-CM | POA: Insufficient documentation

## 2014-09-09 MED ORDER — PRENATAL VITAMINS 0.8 MG PO TABS: 1.0000 | ORAL_TABLET | Freq: Every day | ORAL | Status: DC

## 2014-09-09 MED ORDER — PROMETHAZINE HCL 25 MG PO TABS: 25.0000 mg | ORAL_TABLET | Freq: Four times a day (QID) | ORAL | Status: DC | PRN

## 2014-09-09 MED ORDER — ALBUTEROL SULFATE HFA 108 (90 BASE) MCG/ACT IN AERS: 2.0000 | INHALATION_SPRAY | Freq: Four times a day (QID) | RESPIRATORY_TRACT | Status: DC | PRN

## 2014-09-09 MED ORDER — MONTELUKAST SODIUM 10 MG PO TABS: 10.0000 mg | ORAL_TABLET | Freq: Every day | ORAL | Status: DC

## 2014-09-09 NOTE — Progress Notes (Signed)
Nutrition note: 1st visit consult Pt has gained 6.6# @ 4027w2d, which is > expected. Pt reports eating 2-3 meals & 3-4 snacks/d but has had a lot of N/V & sometimes what she cooks is not appealing once she starts eating. Pt reports she was on a low sodium diet due to her high BP & greasy foods have been giving her more N/V so she wants to start eating more baked/ boiled foods again. Pt has not started taking a PNV yet. Pt reports no heartburn. Pt has a shrimp allergy.  Pt received verbal & written education on general nutrition during pregnancy. Encouraged low sodium diet if that is what the doctor still wants her on. Encouraged pt to start taking a PNV. Discussed benefits of BF/ pumping. Discussed wt gain goals of 15-25# or 0.6#/wk. Pt agrees to start taking a PNV. Pt has WIC & is unsure about BF. F/u as needed Blondell RevealLaura Kamayah Pillay, MS, RD, LDN, [facility]CLC

## 2014-09-09 NOTE — Patient Instructions (Signed)
First Trimester of Pregnancy The first trimester of pregnancy is from week 1 until the end of week 12 (months 1 through 3). A week after a sperm fertilizes an egg, the egg will implant on the wall of the uterus. This embryo will begin to develop into a baby. Genes from you and your partner are forming the baby. The female genes determine whether the baby is a boy or a girl. At 6-8 weeks, the eyes and face are formed, and the heartbeat can be seen on ultrasound. At the end of 12 weeks, all the baby's organs are formed.  Now that you are pregnant, you will want to do everything you can to have a healthy baby. Two of the most important things are to get good prenatal care and to follow your health care provider's instructions. Prenatal care is all the medical care you receive before the baby's birth. This care will help prevent, find, and treat any problems during the pregnancy and childbirth. BODY CHANGES Your body goes through many changes during pregnancy. The changes vary from woman to woman.   You may gain or lose a couple of pounds at first.  You may feel sick to your stomach (nauseous) and throw up (vomit). If the vomiting is uncontrollable, call your health care provider.  You may tire easily.  You may develop headaches that can be relieved by medicines approved by your health care provider.  You may urinate more often. Painful urination may mean you have a bladder infection.  You may develop heartburn as a result of your pregnancy.  You may develop constipation because certain hormones are causing the muscles that push waste through your intestines to slow down.  You may develop hemorrhoids or swollen, bulging veins (varicose veins).  Your breasts may begin to grow larger and become tender. Your nipples may stick out more, and the tissue that surrounds them (areola) may become darker.  Your gums may bleed and may be sensitive to brushing and flossing.  Dark spots or blotches (chloasma,  mask of pregnancy) may develop on your face. This will likely fade after the baby is born.  Your menstrual periods will stop.  You may have a loss of appetite.  You may develop cravings for certain kinds of food.  You may have changes in your emotions from day to day, such as being excited to be pregnant or being concerned that something may go wrong with the pregnancy and baby.  You may have more vivid and strange dreams.  You may have changes in your hair. These can include thickening of your hair, rapid growth, and changes in texture. Some women also have hair loss during or after pregnancy, or hair that feels dry or thin. Your hair will most likely return to normal after your baby is born. WHAT TO EXPECT AT YOUR PRENATAL VISITS During a routine prenatal visit:  You will be weighed to make sure you and the baby are growing normally.  Your blood pressure will be taken.  Your abdomen will be measured to track your baby's growth.  The fetal heartbeat will be listened to starting around week 10 or 12 of your pregnancy.  Test results from any previous visits will be discussed. Your health care provider may ask you:  How you are feeling.  If you are feeling the baby move.  If you have had any abnormal symptoms, such as leaking fluid, bleeding, severe headaches, or abdominal cramping.  If you have any questions. Other tests   that may be performed during your first trimester include:  Blood tests to find your blood type and to check for the presence of any previous infections. They will also be used to check for low iron levels (anemia) and Rh antibodies. Later in the pregnancy, blood tests for diabetes will be done along with other tests if problems develop.  Urine tests to check for infections, diabetes, or protein in the urine.  An ultrasound to confirm the proper growth and development of the baby.  An amniocentesis to check for possible genetic problems.  Fetal screens for  spina bifida and Down syndrome.  You may need other tests to make sure you and the baby are doing well. HOME CARE INSTRUCTIONS  Medicines  Follow your health care provider's instructions regarding medicine use. Specific medicines may be either safe or unsafe to take during pregnancy.  Take your prenatal vitamins as directed.  If you develop constipation, try taking a stool softener if your health care provider approves. Diet  Eat regular, well-balanced meals. Choose a variety of foods, such as meat or vegetable-based protein, fish, milk and low-fat dairy products, vegetables, fruits, and whole grain breads and cereals. Your health care provider will help you determine the amount of weight gain that is right for you.  Avoid raw meat and uncooked cheese. These carry germs that can cause birth defects in the baby.  Eating four or five small meals rather than three large meals a day may help relieve nausea and vomiting. If you start to feel nauseous, eating a few soda crackers can be helpful. Drinking liquids between meals instead of during meals also seems to help nausea and vomiting.  If you develop constipation, eat more high-fiber foods, such as fresh vegetables or fruit and whole grains. Drink enough fluids to keep your urine clear or pale yellow. Activity and Exercise  Exercise only as directed by your health care provider. Exercising will help you:  Control your weight.  Stay in shape.  Be prepared for labor and delivery.  Experiencing pain or cramping in the lower abdomen or low back is a good sign that you should stop exercising. Check with your health care provider before continuing normal exercises.  Try to avoid standing for long periods of time. Move your legs often if you must stand in one place for a long time.  Avoid heavy lifting.  Wear low-heeled shoes, and practice good posture.  You may continue to have sex unless your health care provider directs you  otherwise. Relief of Pain or Discomfort  Wear a good support bra for breast tenderness.   Take warm sitz baths to soothe any pain or discomfort caused by hemorrhoids. Use hemorrhoid cream if your health care provider approves.   Rest with your legs elevated if you have leg cramps or low back pain.  If you develop varicose veins in your legs, wear support hose. Elevate your feet for 15 minutes, 3-4 times a day. Limit salt in your diet. Prenatal Care  Schedule your prenatal visits by the twelfth week of pregnancy. They are usually scheduled monthly at first, then more often in the last 2 months before delivery.  Write down your questions. Take them to your prenatal visits.  Keep all your prenatal visits as directed by your health care provider. Safety  Wear your seat belt at all times when driving.  Make a list of emergency phone numbers, including numbers for family, friends, the hospital, and police and fire departments. General Tips    Ask your health care provider for a referral to a local prenatal education class. Begin classes no later than at the beginning of month 6 of your pregnancy.  Ask for help if you have counseling or nutritional needs during pregnancy. Your health care provider can offer advice or refer you to specialists for help with various needs.  Do not use hot tubs, steam rooms, or saunas.  Do not douche or use tampons or scented sanitary pads.  Do not cross your legs for long periods of time.  Avoid cat litter boxes and soil used by cats. These carry germs that can cause birth defects in the baby and possibly loss of the fetus by miscarriage or stillbirth.  Avoid all smoking, herbs, alcohol, and medicines not prescribed by your health care provider. Chemicals in these affect the formation and growth of the baby.  Schedule a dentist appointment. At home, brush your teeth with a soft toothbrush and be gentle when you floss. SEEK MEDICAL CARE IF:   You have  dizziness.  You have mild pelvic cramps, pelvic pressure, or nagging pain in the abdominal area.  You have persistent nausea, vomiting, or diarrhea.  You have a bad smelling vaginal discharge.  You have pain with urination.  You notice increased swelling in your face, hands, legs, or ankles. SEEK IMMEDIATE MEDICAL CARE IF:   You have a fever.  You are leaking fluid from your vagina.  You have spotting or bleeding from your vagina.  You have severe abdominal cramping or pain.  You have rapid weight gain or loss.  You vomit blood or material that looks like coffee grounds.  You are exposed to German measles and have never had them.  You are exposed to fifth disease or chickenpox.  You develop a severe headache.  You have shortness of breath.  You have any kind of trauma, such as from a fall or a car accident. Document Released: 07/17/2001 Document Revised: 12/07/2013 Document Reviewed: 06/02/2013 [employer] Patient Information 2015 [employer]. This information is not intended to replace advice given to you by your health care provider. Make sure you discuss any questions you have with your health care provider.  

## 2014-09-09 NOTE — Progress Notes (Signed)
[facility] 830a with Dr. Javier DockerAhaern.  First Trimester Screen 09/10/14 @ 315p with [facility].

## 2014-09-09 NOTE — Addendum Note (Signed)
Addended by: Darrel HooverASSETTE, KELLY P on: 09/09/2014 03:22 PM   Modules accepted: Orders

## 2014-09-09 NOTE — Progress Notes (Signed)
Subjective:    Dana Parks is a 29 y.o. G2P1001 at [redacted]w[redacted]d by first trimester scan being seen today for her first obstetrical visit.  Her obstetrical history is significant for previous cesarean section for FTP. Patient is unsure if intends to breast feed. Pregnancy history fully reviewed.  Patient has a history of cerebral palsy and epilepsy, self-discontinued Depakote ER when she found out she was pregnant.  Feels that she needs medication, no current neurologist.  Also has asthma, wants to know what medications she can take, was on Singulair and Albuterol inhaler. No other significant symptoms.  Patient reports nausea.  Filed Vitals:   09/09/14 0829 09/09/14 0832  BP: 125/85   Pulse: 105   Height:   (1.473 m)  Weight: 140 lb 9.6 oz (63.776 kg)     HISTORY: OB History  Gravida Para Term Preterm AB SAB TAB Ectopic Multiple Living  0 0 0 0 0 0 1    # Outcome Date GA Lbr Len/2nd Weight Sex Delivery Anes PTL Lv  2 Current           1 Term 12/19/05 [redacted]w[redacted]d  7 lb 11 oz (3.487 kg) F CS-Unspec Spinal  Y     Complications: Cephalopelvic Disproportion,Hypertension affecting pregnancy, antepartum, third trimester     Past Medical History  Diagnosis Date  . Cerebral palsy   . Seizure disorder   . Asthma   . Low sodium diet   . Headache(784.0)     frequently  . Seizures     last one in high school;takes Depakote daily  . Numbness     bilateral feet  . Muscle spasm     takes Flexeril daily  . Peripheral edema     occasionally  . Back pain     unknown   . Anxiety    Past Surgical History  Procedure Laterality Date  . Leg surgery Left   . Bilateral leg surgery as a child    . Cesarean section  2007  . Tooth extraction N/A 02/16/2013    Procedure: EXTRACTION 16, 17, 32;  Surgeon: Georgia Lopes, DDS;  Location: [facility] OR;  Service: Oral Surgery;  Laterality: N/A;   Family History  Problem Relation Age of Onset  . Diabetes Maternal Grandfather   . Hypertension  Maternal Grandfather      Exam    Uterus:     Pelvic Exam:    Perineum: No Hemorrhoids, Normal Perineum   Vulva: normal   Vagina:  normal mucosa, normal discharge   Cervix: multiparous appearance and no bleeding following Pap   Adnexa: normal adnexa and no mass, fullness, tenderness   Bony Pelvis: android and average  System: Breast:  normal appearance, no masses or tenderness   Skin: normal coloration and turgor, no rashes   Neurologic: normal   Extremities: normal strength, tone, and muscle mass   HEENT PERRLA and extra ocular movement intact   Mouth/Teeth mucous membranes moist, pharynx normal without lesions and dental hygiene poor   Neck supple and no masses   Cardiovascular: regular rate and rhythm   Respiratory:  appears well, vitals normal, no respiratory distress, acyanotic, normal RR, chest clear, no wheezing, crepitations, rhonchi, normal symmetric air entry   Abdomen: soft, non-tender; bowel sounds normal; no masses,  no organomegaly   Urinary: urethral meatus normal      Assessment:    Pregnancy: G2P1001 Patient Active Problem List   Diagnosis Date Noted  .  Previous cesarean section complicating pregnancy, antepartum condition or complication 09/09/2014  . Epilepsy affecting pregnancy, antepartum 09/09/2014  . Asthma complicating pregnancy, antepartum 09/09/2014  . Cerebral palsy   . Seizure disorder         Plan:   Initial labs drawn Prenatal vitamins prescribed Neurology referral made to aid in choosing appropriate AED in pregnancy Told to continue Singulair and Albuterol for asthma Problem list reviewed and updated. Genetic Screening discussed First Screen: ordered. Ultrasound discussed; fetal survey: to be ordered later. Follow up in 4 weeks.  Routine obstetric precautions reviewed.   Tereso NewcomerANYANWU,UGONNA A, MD 09/09/2014

## 2014-09-09 NOTE — Addendum Note (Signed)
Addended by: Darrel HooverASSETTE, KELLY P on: 09/09/2014 05:18 PM   Modules accepted: Orders

## 2014-09-09 NOTE — Progress Notes (Signed)
Reports headaches, blurry vision and seeing spots Patient expresses concern with her medications and pregnancy and has stopped some medicines Patient also expresses concern about her previously being a high risk pregnancy Would like Rx for PNV

## 2014-09-10 ENCOUNTER — Ambulatory Visit (HOSPITAL_COMMUNITY)
Admission: RE | Admit: 2014-09-10 | Discharge: 2014-09-10 | Disposition: A | Discharge status: Home / Self Care | Payer: Medicare Other | Source: Ambulatory Visit | Attending: Obstetrics & Gynecology | Admitting: Obstetrics & Gynecology

## 2014-09-10 ENCOUNTER — Encounter (HOSPITAL_COMMUNITY): Payer: Self-pay

## 2014-09-10 ENCOUNTER — Inpatient Hospital Stay (HOSPITAL_COMMUNITY)
Admission: AD | Admit: 2014-09-10 | Discharge: 2014-09-10 | Disposition: A | Discharge status: Home / Self Care | Payer: Medicare Other | Source: Ambulatory Visit | Attending: Obstetrics & Gynecology | Admitting: Obstetrics & Gynecology

## 2014-09-10 ENCOUNTER — Ambulatory Visit (INDEPENDENT_AMBULATORY_CARE_PROVIDER_SITE_OTHER): Payer: Medicare Other | Admitting: Neurology

## 2014-09-10 ENCOUNTER — Encounter: Payer: Self-pay | Admitting: Neurology

## 2014-09-10 ENCOUNTER — Encounter (HOSPITAL_COMMUNITY): Payer: Self-pay | Admitting: *Deleted

## 2014-09-10 VITALS — BP 115/80 | HR 93 | Ht 59.0 in | Wt 141.0 lb

## 2014-09-10 VITALS — BP 103/74 | HR 108 | Wt 141.0 lb

## 2014-09-10 DIAGNOSIS — E860 Dehydration: Secondary | ICD-10-CM

## 2014-09-10 DIAGNOSIS — R569 Unspecified convulsions: Secondary | ICD-10-CM

## 2014-09-10 DIAGNOSIS — R109 Unspecified abdominal pain: Secondary | ICD-10-CM | POA: Insufficient documentation

## 2014-09-10 DIAGNOSIS — Z3682 Encounter for antenatal screening for nuchal translucency: Secondary | ICD-10-CM | POA: Insufficient documentation

## 2014-09-10 DIAGNOSIS — O3421 Maternal care for scar from previous cesarean delivery: Secondary | ICD-10-CM | POA: Insufficient documentation

## 2014-09-10 DIAGNOSIS — Z3A13 13 weeks gestation of pregnancy: Secondary | ICD-10-CM

## 2014-09-10 DIAGNOSIS — O99511 Diseases of the respiratory system complicating pregnancy, first trimester: Secondary | ICD-10-CM | POA: Insufficient documentation

## 2014-09-10 DIAGNOSIS — J45909 Unspecified asthma, uncomplicated: Secondary | ICD-10-CM | POA: Insufficient documentation

## 2014-09-10 DIAGNOSIS — N949 Unspecified condition associated with female genital organs and menstrual cycle: Secondary | ICD-10-CM

## 2014-09-10 DIAGNOSIS — G809 Cerebral palsy, unspecified: Secondary | ICD-10-CM

## 2014-09-10 DIAGNOSIS — O34219 Maternal care for unspecified type scar from previous cesarean delivery: Secondary | ICD-10-CM

## 2014-09-10 DIAGNOSIS — Z36 Encounter for antenatal screening of mother: Secondary | ICD-10-CM | POA: Insufficient documentation

## 2014-09-10 DIAGNOSIS — G40909 Epilepsy, unspecified, not intractable, without status epilepticus: Secondary | ICD-10-CM

## 2014-09-10 DIAGNOSIS — O9989 Other specified diseases and conditions complicating pregnancy, childbirth and the puerperium: Secondary | ICD-10-CM | POA: Diagnosis present

## 2014-09-10 DIAGNOSIS — O99350 Diseases of the nervous system complicating pregnancy, unspecified trimester: Secondary | ICD-10-CM

## 2014-09-10 LAB — PRENATAL PROFILE (SOLSTAS)
ANTIBODY SCREEN: NEGATIVE
Basophils Absolute: 0 10*3/uL (ref 0.0–0.1)
Basophils Relative: 0 % (ref 0–1)
Eosinophils Absolute: 0 10*3/uL (ref 0.0–0.7)
Eosinophils Relative: 0 % (ref 0–5)
HCT: 33.1 % — ABNORMAL LOW (ref 36.0–46.0)
HIV 1&2 Ab, 4th Generation: NONREACTIVE
Hemoglobin: 10.9 g/dL — ABNORMAL LOW (ref 12.0–15.0)
Hepatitis B Surface Ag: NEGATIVE
LYMPHS ABS: 1.8 10*3/uL (ref 0.7–4.0)
LYMPHS PCT: 27 % (ref 12–46)
MCH: 28.2 pg (ref 26.0–34.0)
MCHC: 32.9 g/dL (ref 30.0–36.0)
MCV: 85.5 fL (ref 78.0–100.0)
MPV: 9.7 fL (ref 8.6–12.4)
Monocytes Absolute: 0.5 10*3/uL (ref 0.1–1.0)
Monocytes Relative: 7 % (ref 3–12)
NEUTROS PCT: 66 % (ref 43–77)
Neutro Abs: 4.4 10*3/uL (ref 1.7–7.7)
Platelets: 339 10*3/uL (ref 150–400)
RBC: 3.87 MIL/uL (ref 3.87–5.11)
RDW: 12.8 % (ref 11.5–15.5)
Rh Type: POSITIVE
Rubella: 4.46 Index — ABNORMAL HIGH (ref <0.90)
WBC: 6.6 10*3/uL (ref 4.0–10.5)

## 2014-09-10 LAB — URINALYSIS, ROUTINE W REFLEX MICROSCOPIC
Bilirubin Urine: NEGATIVE
GLUCOSE, UA: NEGATIVE mg/dL
Hgb urine dipstick: NEGATIVE
Ketones, ur: 40 mg/dL — AB
Leukocytes, UA: NEGATIVE
Nitrite: NEGATIVE
PH: 6 (ref 5.0–8.0)
PROTEIN: NEGATIVE mg/dL
SPECIFIC GRAVITY, URINE: 1.01 (ref 1.005–1.030)
Urobilinogen, UA: 0.2 mg/dL (ref 0.0–1.0)

## 2014-09-10 LAB — WET PREP, GENITAL
Clue Cells Wet Prep HPF POC: NONE SEEN
TRICH WET PREP: NONE SEEN
Yeast Wet Prep HPF POC: NONE SEEN

## 2014-09-10 MED ORDER — LEVETIRACETAM 500 MG PO TABS: 500.0000 mg | ORAL_TABLET | Freq: Two times a day (BID) | ORAL | Status: DC

## 2014-09-10 MED ORDER — LACTATED RINGERS IV BOLUS (SEPSIS)
1000.0000 mL | Freq: Once | INTRAVENOUS | Status: AC
  Administered 2014-09-10: 1000 mL via INTRAVENOUS

## 2014-09-10 MED ORDER — ACETAMINOPHEN 500 MG PO TABS
1000.0000 mg | ORAL_TABLET | Freq: Once | ORAL | Status: AC
  Administered 2014-09-10: 1000 mg via ORAL
  Filled 2014-09-10: qty 2

## 2014-09-10 NOTE — Progress Notes (Signed)
[facility]    Provider:  Dr Lucia GaskinsAhern Referring Provider: Dorrene GermanAvbuere, Edwin A, MD Primary Care Physician:  Dorrene GermanAVBUERE,EDWIN A, MD  CC:  Seizure, pregnancy  HPI:  Dana Parks is a 29 y.o. female here as a referral from Dr. Concepcion ElkAvbuere for seizure management during pregnancy. PMHx of cerebral palsy. She has seizures every once in a while now. 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 Her seizures now start with vision changes and passing out, she can be anywhere, and she loses her vision and passes out. She doesn't remember what happens during her loss of consciousness.  Since being treated the seizures are better but now worsening since she has become pregnant. Poor historian, difficult to understand her complete seizure history and evolution of past and current seizure activity.    She is currrently pregnant. She is on depakote but has not taken it because she is scared. The last time she had a seizure was last week. She was getting up and she was trying to go to the bathroom and she saw spots. When she got up, everything went black and she fell back, she opened her eyes and her vision came back and she was able to see. She doesn't know what happens during the seizures. Again, poor historian.   Reviewed notes, labs and imaging from outside physicians, which showed: She found out she was pregant in December after ED visit for nausea and vomiting. she was seen a few days ago by her obgyn, she is a g2p1001 at 6469w2d. She self-discontinued Depakote when she found out she was pregnant in December. OBGyn prescribed pre-natal vitamin and his providing genetic screening and appropriate fetal monitoring including US.   CMP with mildly elevated K (5.7)  And an anion Gap (was vomiting), CBC with anemia (9.7/30.3), valproic acid level in December <10  Review of Systems: Patient complains of symptoms per HPI as well as the following symptoms: seizure, SOB and cough (has asthma). Pertinent negatives per  HPI. All others negative.   History   Social History  . Marital Status: Single    Spouse Name: N/A    Number of Children: 1  . Years of Education: N/A   Occupational History  . Disabled    Social History Main Topics  . Smoking status: Former Smoker -- 0.25 packs/day for 7 years    Types: Cigarettes  . Smokeless tobacco: Never Used  . Alcohol Use: No  . Drug Use: No  . Sexual Activity:    Partners: Male   Other Topics Concern  . Not on file   Social History Narrative    Family History  Problem Relation Age of Onset  . Diabetes Maternal Grandfather   . Hypertension Maternal Grandfather     Past Medical History  Diagnosis Date  . Cerebral palsy   . Seizure disorder   . Asthma   . Low sodium diet   . Headache(784.0)     frequently  . Seizures     last one in high school;takes Depakote daily  . Numbness     bilateral feet  . Muscle spasm     takes Flexeril daily  . Peripheral edema     occasionally  . Back pain     unknown   . Anxiety     Past Surgical History  Procedure Laterality Date  . Leg surgery Left   . Bilateral leg surgery as a child    . Cesarean section  2007  . Tooth  extraction N/A 02/16/2013    Procedure: EXTRACTION 16, 17, 32;  Surgeon: Georgia Lopes, DDS;  Location: [facility] OR;  Service: Oral Surgery;  Laterality: N/A;    Current Outpatient Prescriptions  Medication Sig Dispense Refill  . albuterol (PROVENTIL HFA;VENTOLIN HFA) 108 (90 BASE) MCG/ACT inhaler Inhale 2 puffs into the lungs every 6 (six) hours as needed for wheezing (wheezing). 1 Inhaler 5  . cyclobenzaprine (FLEXERIL) 5 MG tablet Take 5 mg by mouth 3 (three) times daily as needed for muscle spasms (muscle spams).    . diazepam (VALIUM) 5 MG tablet Take 1 tablet (5 mg total) by mouth every 6 (six) hours as needed for muscle spasms. (Patient taking differently: Take 5 mg by mouth 3 (three) times daily. ) 30 tablet 0  . divalproex (DEPAKOTE ER) 250 MG 24 hr tablet Take 250 mg by  mouth daily.    . fluticasone (FLONASE) 50 MCG/ACT nasal spray Place 1 spray into both nostrils daily as needed for allergies or rhinitis.    Marland Kitchen montelukast (SINGULAIR) 10 MG tablet Take 1 tablet (10 mg total) by mouth at bedtime. 30 tablet 5  . Prenatal Multivit-Min-Fe-FA (PRENATAL VITAMINS) 0.8 MG tablet Take 1 tablet by mouth daily. 30 tablet 12  . promethazine (PHENERGAN) 25 MG tablet Take 1 tablet (25 mg total) by mouth every 6 (six) hours as needed for nausea or vomiting. 30 tablet 2  . sertraline (ZOLOFT) 50 MG tablet Take 25 mg by mouth daily.     No current facility-administered medications for this visit.    Allergies as of 09/10/2014 - Review Complete 09/10/2014  Allergen Reaction Noted  . Shrimp [shellfish allergy] Anaphylaxis 09/09/2014  . Codeine Other (See Comments) 11/03/2012  . Dust mite extract Other (See Comments) 11/03/2012  . Mold extract [trichophyton] Other (See Comments) 11/03/2012  . Penicillins Other (See Comments) 11/03/2012  . Soap Other (See Comments) 11/03/2012    Vitals: BP 115/80 mmHg  Pulse 93  Ht  (1.499 m)  Wt 141 lb (63.957 kg)  BMI 28.46 kg/m2  LMP 06/25/2014 Last Weight:  Wt Readings from Last 1 Encounters:  09/10/14 141 lb (63.957 kg)   Last Height:   Ht Readings from Last 1 Encounters:  09/10/14  (1.499 m)    Physical exam: Exam: Gen: NAD, conversant                     CV: RRR. No Carotid Bruits. No peripheral edema, warm, nontender Eyes: Conjunctivae clear without exudates or hemorrhage  Neuro: Detailed Neurologic Exam  Speech:    Speech is mildly dysarthric Cognition:    The patient is oriented to person, place, and time;     recent and remote memory appear grossly intact;     language is without aphasia;     normal attention, concentration,     No fund of knowledge deficit noted on exam Cranial Nerves:    The pupils are equal, round, and reactive to light. The fundi are normal and spontaneous venous  pulsations are present. Visual fields are full to finger confrontation. Extraocular movements are intact. Trigeminal sensation is intact and the muscles of mastication are normal. The face is symmetric on smile (Decreased facial ovements on the left side but appears relatively symtric on smile.). The palate elevates in the midline. Hearing intact. Voice is normal. Shoulder shrug is normal. The tongue has normal motion without fasciculations.   Coordination:    Normal finger to nose. Unable to perform  HTS due to increased tone   Gait:spastic   Motor Observation:     no involuntary movements noted. Tone:   Increased bilateral LE> right arm    Posture:    Posture is normal.     Strength: Decreased ROM right ankle. Strength appears relatively intact on testing     Sensation: intact to LT     Reflex Exam:  nml upper reflexes Absent right achilles DTRs 3+ patellars and left achilles DTRs   Toes:    The toes are equiv bilaterally.   Clonus:    Few beats lowers    Assessment/Plan:  29 year old female with PMHx CP, Seizures here for management of epilepsy. She is [redacted] weeks pregnant. She was on Depakote but self discontinued in December when she found out she was pregnant.   Explained that no AEDs are known to be safe in pregnancy however I think Keppra is a good choice and has less risk than other AEDs.  I think we should consider extended EEG monitoring in the future.  Will order a routine EEG, she can have it done in the office. I need to follow closely, want to her to come back in 4-6 weeks Important to take daily folate, vitamin D and calcium. Continue pre-natal vitamin   Naomie Dean, MD  [facility] [address] [place], [place] [address]  Phone [phone] Fax [phone]

## 2014-09-10 NOTE — Discharge Instructions (Signed)
Dehydration, Adult Dehydration means your body does not have as much fluid as it needs. Your kidneys, brain, and heart will not work properly without the right amount of fluids and salt.  HOME CARE  Ask your doctor how to replace body fluid losses (rehydrate).  Drink enough fluids to keep your pee (urine) clear or pale yellow.  Drink small amounts of fluids often if you feel sick to your stomach (nauseous) or throw up (vomit).  Eat like you normally do.  Avoid:  Foods or drinks high in sugar.  Bubbly (carbonated) drinks.  Juice.  Very hot or cold fluids.  Drinks with caffeine.  Fatty, greasy foods.  Alcohol.  Tobacco.  Eating too much.  Gelatin desserts.  Wash your hands to avoid spreading germs (bacteria, viruses).  Only take medicine as told by your doctor.  Keep all doctor visits as told. GET HELP RIGHT AWAY IF:   You cannot drink something without throwing up.  You get worse even with treatment.  Your vomit has blood in it or looks greenish.  Your poop (stool) has blood in it or looks black and tarry.  You have not peed in 6 to 8 hours.  You pee a small amount of very dark pee.  You have a fever.  You pass out (faint).  You have belly (abdominal) pain that gets worse or stays in one spot (localizes).  You have a rash, stiff neck, or bad headache.  You get easily annoyed, sleepy, or are hard to wake up.  You feel weak, dizzy, or very thirsty. MAKE SURE YOU:   Understand these instructions.  Will watch your condition.  Will get help right away if you are not doing well or get worse. Document Released: 05/19/2009 Document Revised: 10/15/2011 Document Reviewed: 03/12/2011 [facility] Patient Information 2015 [employer], [place]. This information is not intended to replace advice given to you by your health care provider. Make sure you discuss any questions you have with your health care provider. Abdominal Pain During Pregnancy Belly (abdominal) pain  is common during pregnancy. Most of the time, it is not a serious problem. Other times, it can be a sign that something is wrong with the pregnancy. Always tell your doctor if you have belly pain. HOME CARE Monitor your belly pain for any changes. The following actions may help you feel better:  Do not have sex (intercourse) or put anything in your vagina until you feel better.  Rest until your pain stops.  Drink clear fluids if you feel sick to your stomach (nauseous). Do not eat solid food until you feel better.  Only take medicine as told by your doctor.  Keep all doctor visits as told. GET HELP RIGHT AWAY IF:   You are bleeding, leaking fluid, or pieces of tissue come out of your vagina.  You have more pain or cramping.  You keep throwing up (vomiting).  You have pain when you pee (urinate) or have blood in your pee.  You have a fever.  You do not feel your baby moving as much.  You feel very weak or feel like passing out.  You have trouble breathing, with or without belly pain.  You have a very bad headache and belly pain.  You have fluid leaking from your vagina and belly pain.  You keep having watery poop (diarrhea).  Your belly pain does not go away after resting, or the pain gets worse. MAKE SURE YOU:   Understand these instructions.  Will watch your  condition.  Will get help right away if you are not doing well or get worse. Document Released: 07/11/2009 Document Revised: 03/25/2013 Document Reviewed: 02/19/2013 [facility] Patient Information 2015 [place], [place]. This information is not intended to replace advice given to you by your health care provider. Make sure you discuss any questions you have with your health care provider.

## 2014-09-10 NOTE — MAU Provider Note (Signed)
History     CSN: [id number]  Arrival date and time: 09/10/14 1443   First Provider Initiated Contact with Patient 09/10/14 1520      Chief Complaint  Patient presents with  . Fatigue  . Dizziness  . Abdominal Pain  . Hip Pain   HPI  Dana Parks is a 29 y.o. G2P1001 at [redacted]w[redacted]d who presents to MAU today with multiple complaints. She states that she had been "running around all day" and has not eaten anything. She had 2 MD appointments today. She has had to take the bus from place to place and walk. She came here just after [place] in [facility] for Nuchal translucency. She states that she feels lightheaded and weak. She also complains of diffuse abdominal pain and right sided hip pain. The patient states a history of right hip pain from surgery. She denies vaginal bleeding, but has had a thin, white discharge.   OB History    Gravida Para Term Preterm AB TAB SAB Ectopic Multiple Living   0 0 0 0 0 0 1      Past Medical History  Diagnosis Date  . Cerebral palsy   . Seizure disorder   . Asthma   . Low sodium diet   . Headache(784.0)     frequently  . Seizures     last one in high school;takes Depakote daily  . Numbness     bilateral feet  . Muscle spasm     takes Flexeril daily  . Peripheral edema     occasionally  . Back pain     unknown   . Anxiety   . Liver disease   . Cerebral palsy     Past Surgical History  Procedure Laterality Date  . Leg surgery Left   . Bilateral leg surgery as a child    . Cesarean section  2007  . Tooth extraction N/A 02/16/2013    Procedure: EXTRACTION 16, 17, 32;  Surgeon: Georgia Lopes, DDS;  Location: [facility] OR;  Service: Oral Surgery;  Laterality: N/A;    Family History  Problem Relation Age of Onset  . Diabetes Maternal Grandfather   . Hypertension Maternal Grandfather     History  Substance Use Topics  . Smoking status: Never Smoker   . Smokeless tobacco: Never Used  . Alcohol Use: No    Allergies:  Allergies  Allergen  Reactions  . Shrimp [Shellfish Allergy] Anaphylaxis    Can eat other shellfish  . Codeine Other (See Comments)    GI upset  . Dust Mite Extract Other (See Comments)    Unknown reaction  . Mold Extract [Trichophyton] Other (See Comments)    Unknown reaction  . Penicillins Other (See Comments)    Unknown reaction  . Soap Other (See Comments)    Unknown reaction    Prescriptions prior to admission  Medication Sig Dispense Refill Last Dose  . albuterol (PROVENTIL HFA;VENTOLIN HFA) 108 (90 BASE) MCG/ACT inhaler Inhale 2 puffs into the lungs every 6 (six) hours as needed for wheezing (wheezing). 1 Inhaler 5 09/10/2014 at Unknown time  . fluticasone (FLONASE) 50 MCG/ACT nasal spray Place 1 spray into both nostrils daily as needed for allergies or rhinitis.   Past Week at Unknown time  . Prenatal Multivit-Min-Fe-FA (PRENATAL VITAMINS) 0.8 MG tablet Take 1 tablet by mouth daily. 30 tablet 12 09/10/2014 at Unknown time  . promethazine (PHENERGAN) 25 MG tablet Take 1 tablet (25 mg total) by mouth  every 6 (six) hours as needed for nausea or vomiting. 30 tablet 2 09/10/2014 at Unknown time  . diazepam (VALIUM) 5 MG tablet Take 1 tablet (5 mg total) by mouth every 6 (six) hours as needed for muscle spasms. (Patient not taking: Reported on 09/10/2014) 30 tablet 0 Not Taking  . levETIRAcetam (KEPPRA) 500 MG tablet Take 1 tablet (500 mg total) by mouth 2 (two) times daily. 60 tablet 11 Not Taking  . montelukast (SINGULAIR) 10 MG tablet Take 1 tablet (10 mg total) by mouth at bedtime. (Patient not taking: Reported on 09/10/2014) 30 tablet 5 Not Taking    Review of Systems  Constitutional: Negative for fever and malaise/fatigue.  Gastrointestinal: Positive for abdominal pain. Negative for nausea and vomiting.  Genitourinary:       Neg - vaginal bleeding + vaginal discharge  Neurological: Positive for dizziness. Negative for loss of consciousness and weakness.   Physical Exam   Blood pressure 144/89, pulse  116, temperature 98.1 F (36.7 C), temperature source Oral, resp. rate 18, last menstrual period 06/25/2014.  Physical Exam  Constitutional: She is oriented to person, place, and time. She appears well-developed and well-nourished. No distress.  HENT:  Head: Normocephalic.  Cardiovascular: Normal rate.   Respiratory: Effort normal.  GI: Soft. Bowel sounds are normal. She exhibits no distension and no mass. There is no tenderness. There is no rebound and no guarding.  Neurological: She is alert and oriented to person, place, and time.  Skin: Skin is warm and dry. No erythema.  Psychiatric: She has a normal mood and affect.   Results for orders placed or performed during the hospital encounter of 09/10/14 (from the past 24 hour(s))  Urinalysis, Routine w reflex microscopic     Status: Abnormal   Collection Time: 09/10/14  3:20 PM  Result Value Ref Range   Color, Urine YELLOW YELLOW   APPearance CLEAR CLEAR   Specific Gravity, Urine 1.010 1.005 - 1.030   pH 6.0 5.0 - 8.0   Glucose, UA NEGATIVE NEGATIVE mg/dL   Hgb urine dipstick NEGATIVE NEGATIVE   Bilirubin Urine NEGATIVE NEGATIVE   Ketones, ur 40 (A) NEGATIVE mg/dL   Protein, ur NEGATIVE NEGATIVE mg/dL   Urobilinogen, UA 0.2 0.0 - 1.0 mg/dL   Nitrite NEGATIVE NEGATIVE   Leukocytes, UA NEGATIVE NEGATIVE  Wet prep, genital     Status: Abnormal   Collection Time: 09/10/14  4:00 PM  Result Value Ref Range   Yeast Wet Prep HPF POC NONE SEEN NONE SEEN   Trich, Wet Prep NONE SEEN NONE SEEN   Clue Cells Wet Prep HPF POC NONE SEEN NONE SEEN   WBC, Wet Prep HPF POC FEW (A) NONE SEEN    MAU Course  Procedures None  MDM FHR - 154 bpm with doppler While I was in the room for the patient's exam, she was coughing and then rolled her head back and said "my eye sight!" I calmed her down and asked her how many fingers I was holding up and she was able to tell me accurately.  UA and wet prep today 1000 mg Tylenol given in MAU 1 liter IV  LR given  Patient given crackers and juice, patient allowed to order food Patient feels 100% better and denies pain at time of discharge Assessment and Plan  A: SIUP at [redacted]w[redacted]d Round ligament pain Dehydration  P: Discharge home Patient advised to take Tylenol PRN for pain Encouraged increased PO hydration and regular diet Patient advised to follow-up  with [facility] as scheduled for routine prenatal care Patient may return to MAU as needed or if her condition were to change or worsen   Marny LowensteinJulie N Alyssandra Hulsebus, PA-C  09/10/2014, 6:15 PM

## 2014-09-10 NOTE — ED Notes (Signed)
Pt having back and lower abd pain, feeling weak and dizzy.  Had not eaten since breakfast this morning.  Crackers and water given to pt.  Pt felt nauseated and started gagging.  Pt taken to MAU via wheelchair for further evaluation.

## 2014-09-10 NOTE — MAU Note (Signed)
Patient presents at [redacted] weeks gestation from MFM with complaints of right sided pain that shoots down her leg; mid and upper abdominal pain; and states that she feels weak and dizzy but states that she hasn't eaten today and did a lot of walking this morning. Has a history of cerebral palsy.

## 2014-09-10 NOTE — Patient Instructions (Signed)
Overall you are doing fairly well but I do want to suggest a few things today:   Remember to drink plenty of fluid, eat healthy meals and do not skip any meals. Try to eat protein with a every meal and eat a healthy snack such as fruit or nuts in between meals. Try to keep a regular sleep-wake schedule and try to exercise daily, particularly in the form of walking, 20-30 minutes a day, if you can.   As far as your medications are concerned, I would like to suggest: Keppra 500mg  twice daily. Daily vitamin including folic acid. Calcium and Vitamin D.   I would like to see you back in 4 - 8 weeks, sooner if we need to. Please call us with any interim questions, concerns, problems, updates or refill requests.   Our phone number is [phone]. We also have an after hours call service for urgent matters and there is a physician on-call for urgent questions. For any emergencies you know to call 911 or go to the nearest emergency room

## 2014-09-11 LAB — PRESCRIPTION MONITORING PROFILE (19 PANEL)
Amphetamine/Meth: NEGATIVE ng/mL
Barbiturate Screen, Urine: NEGATIVE ng/mL
Benzodiazepine Screen, Urine: NEGATIVE ng/mL
Buprenorphine, Urine: NEGATIVE ng/mL
CARISOPRODOL, URINE: NEGATIVE ng/mL
CREATININE, URINE: 403.79 mg/dL (ref >20.0)
Cannabinoid Scrn, Ur: NEGATIVE ng/mL
Cocaine Metabolites: NEGATIVE ng/mL
Fentanyl, Ur: NEGATIVE ng/mL
MDMA URINE: NEGATIVE ng/mL
MEPERIDINE UR: NEGATIVE ng/mL
Methadone Screen, Urine: NEGATIVE ng/mL
Methaqualone: NEGATIVE ng/mL
Nitrites, Initial: NEGATIVE ug/mL
OXYCODONE SCRN UR: NEGATIVE ng/mL
Opiate Screen, Urine: NEGATIVE ng/mL
PH URINE, INITIAL: 5.8 pH (ref 4.5–8.9)
PHENCYCLIDINE, UR: NEGATIVE ng/mL
Propoxyphene: NEGATIVE ng/mL
TAPENTADOLUR: NEGATIVE ng/mL
TRAMADOL UR: NEGATIVE ng/mL
Zolpidem, Urine: NEGATIVE ng/mL

## 2014-09-13 LAB — CYTOLOGY - PAP

## 2014-09-17 ENCOUNTER — Other Ambulatory Visit (HOSPITAL_COMMUNITY): Payer: Self-pay

## 2014-09-20 ENCOUNTER — Encounter (HOSPITAL_COMMUNITY): Payer: Self-pay

## 2014-09-24 ENCOUNTER — Inpatient Hospital Stay (HOSPITAL_COMMUNITY)
Admission: AD | Admit: 2014-09-24 | Discharge: 2014-09-24 | Disposition: A | Discharge status: Home / Self Care | Payer: Medicare Other | Source: Ambulatory Visit | Attending: Obstetrics & Gynecology | Admitting: Obstetrics & Gynecology

## 2014-09-24 ENCOUNTER — Encounter (HOSPITAL_COMMUNITY): Payer: Self-pay | Admitting: *Deleted

## 2014-09-24 ENCOUNTER — Emergency Department (HOSPITAL_COMMUNITY)
Admission: EM | Admit: 2014-09-24 | Discharge: 2014-09-24 | Discharge status: Against Medical Advice | Payer: Medicare Other | Source: Home / Self Care

## 2014-09-24 ENCOUNTER — Inpatient Hospital Stay (HOSPITAL_COMMUNITY): Payer: Medicare Other

## 2014-09-24 DIAGNOSIS — Y998 Other external cause status: Secondary | ICD-10-CM | POA: Insufficient documentation

## 2014-09-24 DIAGNOSIS — Y92512 Supermarket, store or market as the place of occurrence of the external cause: Secondary | ICD-10-CM | POA: Insufficient documentation

## 2014-09-24 DIAGNOSIS — S80212A Abrasion, left knee, initial encounter: Secondary | ICD-10-CM | POA: Diagnosis not present

## 2014-09-24 DIAGNOSIS — S3991XA Unspecified injury of abdomen, initial encounter: Secondary | ICD-10-CM

## 2014-09-24 DIAGNOSIS — W109XXA Fall (on) (from) unspecified stairs and steps, initial encounter: Secondary | ICD-10-CM | POA: Insufficient documentation

## 2014-09-24 DIAGNOSIS — G809 Cerebral palsy, unspecified: Secondary | ICD-10-CM | POA: Diagnosis not present

## 2014-09-24 DIAGNOSIS — S8992XA Unspecified injury of left lower leg, initial encounter: Secondary | ICD-10-CM

## 2014-09-24 DIAGNOSIS — W108XXA Fall (on) (from) other stairs and steps, initial encounter: Secondary | ICD-10-CM

## 2014-09-24 DIAGNOSIS — O368120 Decreased fetal movements, second trimester, not applicable or unspecified: Secondary | ICD-10-CM | POA: Insufficient documentation

## 2014-09-24 DIAGNOSIS — S79922A Unspecified injury of left thigh, initial encounter: Secondary | ICD-10-CM

## 2014-09-24 DIAGNOSIS — Y92009 Unspecified place in unspecified non-institutional (private) residence as the place of occurrence of the external cause: Secondary | ICD-10-CM

## 2014-09-24 DIAGNOSIS — Z3A15 15 weeks gestation of pregnancy: Secondary | ICD-10-CM

## 2014-09-24 DIAGNOSIS — Z87891 Personal history of nicotine dependence: Secondary | ICD-10-CM | POA: Diagnosis not present

## 2014-09-24 DIAGNOSIS — Z3490 Encounter for supervision of normal pregnancy, unspecified, unspecified trimester: Secondary | ICD-10-CM

## 2014-09-24 DIAGNOSIS — W1830XA Fall on same level, unspecified, initial encounter: Secondary | ICD-10-CM

## 2014-09-24 DIAGNOSIS — O9A212 Injury, poisoning and certain other consequences of external causes complicating pregnancy, second trimester: Secondary | ICD-10-CM | POA: Insufficient documentation

## 2014-09-24 DIAGNOSIS — O99512 Diseases of the respiratory system complicating pregnancy, second trimester: Secondary | ICD-10-CM

## 2014-09-24 DIAGNOSIS — Y9301 Activity, walking, marching and hiking: Secondary | ICD-10-CM | POA: Insufficient documentation

## 2014-09-24 DIAGNOSIS — W19XXXA Unspecified fall, initial encounter: Secondary | ICD-10-CM

## 2014-09-24 DIAGNOSIS — S301XXA Contusion of abdominal wall, initial encounter: Secondary | ICD-10-CM | POA: Insufficient documentation

## 2014-09-24 DIAGNOSIS — J45909 Unspecified asthma, uncomplicated: Secondary | ICD-10-CM

## 2014-09-24 DIAGNOSIS — O9989 Other specified diseases and conditions complicating pregnancy, childbirth and the puerperium: Secondary | ICD-10-CM | POA: Insufficient documentation

## 2014-09-24 LAB — COMPREHENSIVE METABOLIC PANEL
ALBUMIN: 3.1 g/dL — AB (ref 3.5–5.2)
ALT: 36 U/L — ABNORMAL HIGH (ref 0–35)
ANION GAP: 7 (ref 5–15)
AST: 39 U/L — ABNORMAL HIGH (ref 0–37)
Alkaline Phosphatase: 90 U/L (ref 39–117)
BUN: 6 mg/dL (ref 6–23)
CALCIUM: 9.6 mg/dL (ref 8.4–10.5)
CO2: 26 mmol/L (ref 19–32)
CREATININE: 0.54 mg/dL (ref 0.50–1.10)
Chloride: 101 mmol/L (ref 96–112)
GFR calc non Af Amer: >90 mL/min (ref >90)
GLUCOSE: 86 mg/dL (ref 70–99)
Potassium: 3.6 mmol/L (ref 3.5–5.1)
SODIUM: 134 mmol/L — AB (ref 135–145)
TOTAL PROTEIN: 7.1 g/dL (ref 6.0–8.3)
Total Bilirubin: 0.2 mg/dL — ABNORMAL LOW (ref 0.3–1.2)

## 2014-09-24 LAB — I-STAT BETA HCG BLOOD, ED (MC, WL, AP ONLY)

## 2014-09-24 MED ORDER — PROMETHAZINE HCL 25 MG PO TABS
25.0000 mg | ORAL_TABLET | ORAL | Status: AC
  Administered 2014-09-24: 25 mg via ORAL
  Filled 2014-09-24: qty 1

## 2014-09-24 MED ORDER — TRAMADOL HCL 50 MG PO TABS
50.0000 mg | ORAL_TABLET | ORAL | Status: AC
  Administered 2014-09-24: 50 mg via ORAL
  Filled 2014-09-24: qty 1

## 2014-09-24 NOTE — Discharge Instructions (Signed)
First Trimester of Pregnancy The first trimester of pregnancy is from week 1 until the end of week 12 (months 1 through 3). A week after a sperm fertilizes an egg, the egg will implant on the wall of the uterus. This embryo will begin to develop into a baby. Genes from you and your partner are forming the baby. The female genes determine whether the baby is a boy or a girl. At 6-8 weeks, the eyes and face are formed, and the heartbeat can be seen on ultrasound. At the end of 12 weeks, all the baby's organs are formed.  Now that you are pregnant, you will want to do everything you can to have a healthy baby. Two of the most important things are to get good prenatal care and to follow your health care provider's instructions. Prenatal care is all the medical care you receive before the baby's birth. This care will help prevent, find, and treat any problems during the pregnancy and childbirth. BODY CHANGES Your body goes through many changes during pregnancy. The changes vary from woman to woman.   You may gain or lose a couple of pounds at first.  You may feel sick to your stomach (nauseous) and throw up (vomit). If the vomiting is uncontrollable, call your health care provider.  You may tire easily.  You may develop headaches that can be relieved by medicines approved by your health care provider.  You may urinate more often. Painful urination may mean you have a bladder infection.  You may develop heartburn as a result of your pregnancy.  You may develop constipation because certain hormones are causing the muscles that push waste through your intestines to slow down.  You may develop hemorrhoids or swollen, bulging veins (varicose veins).  Your breasts may begin to grow larger and become tender. Your nipples may stick out more, and the tissue that surrounds them (areola) may become darker.  Your gums may bleed and may be sensitive to brushing and flossing.  Dark spots or blotches (chloasma,  mask of pregnancy) may develop on your face. This will likely fade after the baby is born.  Your menstrual periods will stop.  You may have a loss of appetite.  You may develop cravings for certain kinds of food.  You may have changes in your emotions from day to day, such as being excited to be pregnant or being concerned that something may go wrong with the pregnancy and baby.  You may have more vivid and strange dreams.  You may have changes in your hair. These can include thickening of your hair, rapid growth, and changes in texture. Some women also have hair loss during or after pregnancy, or hair that feels dry or thin. Your hair will most likely return to normal after your baby is born. WHAT TO EXPECT AT YOUR PRENATAL VISITS During a routine prenatal visit:  You will be weighed to make sure you and the baby are growing normally.  Your blood pressure will be taken.  Your abdomen will be measured to track your baby's growth.  The fetal heartbeat will be listened to starting around week 10 or 12 of your pregnancy.  Test results from any previous visits will be discussed. Your health care provider may ask you:  How you are feeling.  If you are feeling the baby move.  If you have had any abnormal symptoms, such as leaking fluid, bleeding, severe headaches, or abdominal cramping.  If you have any questions. Other tests   that may be performed during your first trimester include:  Blood tests to find your blood type and to check for the presence of any previous infections. They will also be used to check for low iron levels (anemia) and Rh antibodies. Later in the pregnancy, blood tests for diabetes will be done along with other tests if problems develop.  Urine tests to check for infections, diabetes, or protein in the urine.  An ultrasound to confirm the proper growth and development of the baby.  An amniocentesis to check for possible genetic problems.  Fetal screens for  spina bifida and Down syndrome.  You may need other tests to make sure you and the baby are doing well. HOME CARE INSTRUCTIONS  Medicines  Follow your health care provider's instructions regarding medicine use. Specific medicines may be either safe or unsafe to take during pregnancy.  Take your prenatal vitamins as directed.  If you develop constipation, try taking a stool softener if your health care provider approves. Diet  Eat regular, well-balanced meals. Choose a variety of foods, such as meat or vegetable-based protein, fish, milk and low-fat dairy products, vegetables, fruits, and whole grain breads and cereals. Your health care provider will help you determine the amount of weight gain that is right for you.  Avoid raw meat and uncooked cheese. These carry germs that can cause birth defects in the baby.  Eating four or five small meals rather than three large meals a day may help relieve nausea and vomiting. If you start to feel nauseous, eating a few soda crackers can be helpful. Drinking liquids between meals instead of during meals also seems to help nausea and vomiting.  If you develop constipation, eat more high-fiber foods, such as fresh vegetables or fruit and whole grains. Drink enough fluids to keep your urine clear or pale yellow. Activity and Exercise  Exercise only as directed by your health care provider. Exercising will help you:  Control your weight.  Stay in shape.  Be prepared for labor and delivery.  Experiencing pain or cramping in the lower abdomen or low back is a good sign that you should stop exercising. Check with your health care provider before continuing normal exercises.  Try to avoid standing for long periods of time. Move your legs often if you must stand in one place for a long time.  Avoid heavy lifting.  Wear low-heeled shoes, and practice good posture.  You may continue to have sex unless your health care provider directs you  otherwise. Relief of Pain or Discomfort  Wear a good support bra for breast tenderness.   Take warm sitz baths to soothe any pain or discomfort caused by hemorrhoids. Use hemorrhoid cream if your health care provider approves.   Rest with your legs elevated if you have leg cramps or low back pain.  If you develop varicose veins in your legs, wear support hose. Elevate your feet for 15 minutes, 3-4 times a day. Limit salt in your diet. Prenatal Care  Schedule your prenatal visits by the twelfth week of pregnancy. They are usually scheduled monthly at first, then more often in the last 2 months before delivery.  Write down your questions. Take them to your prenatal visits.  Keep all your prenatal visits as directed by your health care provider. Safety  Wear your seat belt at all times when driving.  Make a list of emergency phone numbers, including numbers for family, friends, the hospital, and police and fire departments. General Tips    Ask your health care provider for a referral to a local prenatal education class. Begin classes no later than at the beginning of month 6 of your pregnancy.  Ask for help if you have counseling or nutritional needs during pregnancy. Your health care provider can offer advice or refer you to specialists for help with various needs.  Do not use hot tubs, steam rooms, or saunas.  Do not douche or use tampons or scented sanitary pads.  Do not cross your legs for long periods of time.  Avoid cat litter boxes and soil used by cats. These carry germs that can cause birth defects in the baby and possibly loss of the fetus by miscarriage or stillbirth.  Avoid all smoking, herbs, alcohol, and medicines not prescribed by your health care provider. Chemicals in these affect the formation and growth of the baby.  Schedule a dentist appointment. At home, brush your teeth with a soft toothbrush and be gentle when you floss. SEEK MEDICAL CARE IF:   You have  dizziness.  You have mild pelvic cramps, pelvic pressure, or nagging pain in the abdominal area.  You have persistent nausea, vomiting, or diarrhea.  You have a bad smelling vaginal discharge.  You have pain with urination.  You notice increased swelling in your face, hands, legs, or ankles. SEEK IMMEDIATE MEDICAL CARE IF:   You have a fever.  You are leaking fluid from your vagina.  You have spotting or bleeding from your vagina.  You have severe abdominal cramping or pain.  You have rapid weight gain or loss.  You vomit blood or material that looks like coffee grounds.  You are exposed to German measles and have never had them.  You are exposed to fifth disease or chickenpox.  You develop a severe headache.  You have shortness of breath.  You have any kind of trauma, such as from a fall or a car accident. Document Released: 07/17/2001 Document Revised: 12/07/2013 Document Reviewed: 06/02/2013 Exit[employer] Patient Information 2015 [employer]. This information is not intended to replace advice given to you by your health care provider. Make sure you discuss any questions you have with your health care provider.  

## 2014-09-24 NOTE — MAU Note (Signed)
Larey SeatFell on the concrete yesterday afternoon.  Bruise LUQ of abd and left leg/knee is scraped - started draining today.  Pain in abd and leg has gotten worse. Can't hardly walk.  Went to [facility], but they weren't really doing anything so came here.

## 2014-09-24 NOTE — MAU Note (Addendum)
Patient states she fell yesterday around 1430 as she was walking up concrete steps outside.  She states she fell and hit her left knee and and hit her upper abdomen LUQ on a step.  She states the pain has gotten worse today so she called an ambulance that [facility].  She was unhappy with the care at [facility] so she decided to [facility].  She states feeling cramping in her lower abdomen, but denies vaginal bleeding.

## 2014-09-24 NOTE — MAU Provider Note (Signed)
History     CSN: [id number]  Arrival date and time: 09/24/14 1751   First Provider Initiated Contact with Patient 09/24/14 1928      Chief Complaint  Patient presents with  . Fall   HPI  29 y.o.G2P1001@[redacted]w[redacted]d  presents to the MAU after having a fall yesterday down concrete steps. Pt has cerebral palsy and has difficulty walking. She has medium size bruise on upper left quadrant of abdomen. She reports decreased fetal movement since fall. She has an abrasion on her left knee and reports pain in her upper thigh and he left calf is swollen. She can walk on her left leg but is painful. She denies any vaginal bleeding, LOF.     Past Medical History  Diagnosis Date  . Cerebral palsy   . Seizure disorder   . Asthma   . Low sodium diet   . Headache(784.0)     frequently  . Seizures     last one in high school;takes Depakote daily  . Numbness     bilateral feet  . Muscle spasm     takes Flexeril daily  . Peripheral edema     occasionally  . Back pain     unknown   . Anxiety   . Liver disease   . Cerebral palsy     Past Surgical History  Procedure Laterality Date  . Leg surgery Left   . Bilateral leg surgery as a child    . Cesarean section  2007  . Tooth extraction N/A 02/16/2013    Procedure: EXTRACTION 16, 17, 32;  Surgeon: Georgia Lopes, DDS;  Location: [facility] OR;  Service: Oral Surgery;  Laterality: N/A;    Family History  Problem Relation Age of Onset  . Diabetes Maternal Grandfather   . Hypertension Maternal Grandfather     History  Substance Use Topics  . Smoking status: Former Smoker -- 0.25 packs/day for 7 years    Types: Cigarettes  . Smokeless tobacco: Never Used  . Alcohol Use: No    Allergies:  Allergies  Allergen Reactions  . Shrimp [Shellfish Allergy] Anaphylaxis    Can eat other shellfish  . Codeine Other (See Comments)    GI upset  . Dust Mite Extract Other (See Comments)    Unknown reaction  . Mold Extract [Trichophyton] Other (See  Comments)    Unknown reaction  . Penicillins Other (See Comments)    Unknown reaction  . Soap Other (See Comments)    Unknown reaction    Prescriptions prior to admission  Medication Sig Dispense Refill Last Dose  . albuterol (PROVENTIL HFA;VENTOLIN HFA) 108 (90 BASE) MCG/ACT inhaler Inhale 2 puffs into the lungs every 6 (six) hours as needed for wheezing (wheezing). 1 Inhaler 5 09/24/2014 at Unknown time  . fluticasone (FLONASE) 50 MCG/ACT nasal spray Place 1 spray into both nostrils daily as needed for allergies or rhinitis.   Past Month at Unknown time  . levETIRAcetam (KEPPRA) 500 MG tablet Take 1 tablet (500 mg total) by mouth 2 (two) times daily. 60 tablet 11 09/23/2014 at Unknown time  . Prenatal Multivit-Min-Fe-FA (PRENATAL VITAMINS) 0.8 MG tablet Take 1 tablet by mouth daily. 30 tablet 12 09/23/2014 at Unknown time  . promethazine (PHENERGAN) 25 MG tablet Take 1 tablet (25 mg total) by mouth every 6 (six) hours as needed for nausea or vomiting. 30 tablet 2 Past Month at Unknown time    Review of Systems  Constitutional: Negative.   Eyes: Negative.   Gastrointestinal:  Positive for abdominal pain.       LUQ bruise approx 3cm  Musculoskeletal: Positive for joint pain and falls.       FAll resulting in pain in left thigh; abrasion knee and swollen; calf left leg swollen  Skin:       Abrasion left knee  Neurological: Negative.   Endo/Heme/Allergies: Negative.    Physical Exam   Blood pressure 128/73, pulse 102, temperature 98 F (36.7 C), temperature source Oral, resp. rate 16, last menstrual period 06/25/2014.  Physical Exam  Nursing note and vitals reviewed. Constitutional: She is oriented to person, place, and time. She appears well-developed and well-nourished. No distress.  HENT:  Head: Normocephalic and atraumatic.  Cardiovascular: Normal rate.   Respiratory: Effort normal. No respiratory distress.  GI: Soft. She exhibits no distension and no mass. There is tenderness  in the left upper quadrant. There is no rebound and no guarding.    LUQ  Musculoskeletal: She exhibits edema and tenderness.       Left knee: She exhibits decreased range of motion, swelling and effusion. Tenderness found.       Left upper leg: She exhibits tenderness.       Left lower leg: She exhibits tenderness and swelling.  Left leg  Neurological: She is alert and oriented to person, place, and time. Coordination abnormal.  Cerebral palsy  Skin: Skin is warm and dry.  Psychiatric: She has a normal mood and affect. Her behavior is normal. Judgment and thought content normal.    MAU Course  Procedures Ultrasound Ice to left leg Ultram/Phenergan Transferred Care to Jannifer RodneyLinda Rozell Kettlewell NP @ 900pm  Results for orders placed or performed during the hospital encounter of 09/24/14 (from the past 24 hour(s))  Comprehensive metabolic panel     Status: Abnormal   Collection Time: 09/24/14  2:59 PM  Result Value Ref Range   Sodium 134 (L) 135 - 145 mmol/L   Potassium 3.6 3.5 - 5.1 mmol/L   Chloride 101 96 - 112 mmol/L   CO2 26 19 - 32 mmol/L   Glucose, Bld 86 70 - 99 mg/dL   BUN 6 6 - 23 mg/dL   Creatinine, Ser 9.600.54 0.50 - 1.10 mg/dL   Calcium 9.6 8.4 - 45.410.5 mg/dL   Total Protein 7.1 6.0 - 8.3 g/dL   Albumin 3.1 (L) 3.5 - 5.2 g/dL   AST 39 (H) 0 - 37 U/L   ALT 36 (H) 0 - 35 U/L   Alkaline Phosphatase 90 39 - 117 U/L   Total Bilirubin 0.2 (L) 0.3 - 1.2 mg/dL   GFR calc non Af Amer >90 >90 mL/min   GFR calc Af Amer >90 >90 mL/min   Anion gap 7 5 - 15  I-Stat Beta hCG blood, ED ([facility], [facility], [facility] only)     Status: Abnormal   Collection Time: 09/24/14  3:54 PM  Result Value Ref Range   I-stat hCG, quantitative >2000.0 (H) <5 mIU/mL   Comment 3            U/S No Placental abruption identified from Preliminary report   Assessment and Plan  IUP @ 15+3 Fall to abdomen Left leg muscle strain  P: Reviewed ultrasound with patient Advised to see PCP for further issues with knee Advised  to take Flexeril as needed for pains Keep Clinic appointment on March 3 Return to MAU   [facility],Lori Grissett 09/24/2014, 8:17 PM

## 2014-09-24 NOTE — ED Notes (Signed)
Family and patient informed of delay.

## 2014-09-24 NOTE — ED Notes (Signed)
Pt reports being approx [redacted] weeks pregnant. Pt fell yesterday when walking into the store, landed on concrete. Has small bruise to LUQ and pain to left knee. Denies any vaginal discharge or bleeding since fall.

## 2014-10-04 ENCOUNTER — Telehealth: Payer: Self-pay | Admitting: *Deleted

## 2014-10-04 NOTE — Telephone Encounter (Signed)
Patient having a migraine on one side of her face. Patient is taking her medication and having double vision and high headed.

## 2014-10-04 NOTE — Telephone Encounter (Signed)
Talked with patient about symptoms and told her she should go to urgent care to get evaluated per Dr. Lucia GaskinsAhern. Patient verbalized understanding.

## 2014-10-05 ENCOUNTER — Encounter: Payer: Self-pay | Admitting: General Practice

## 2014-10-07 ENCOUNTER — Telehealth: Payer: Self-pay | Admitting: Family

## 2014-10-07 ENCOUNTER — Inpatient Hospital Stay (HOSPITAL_COMMUNITY)
Admission: AD | Admit: 2014-10-07 | Discharge: 2014-10-07 | Disposition: A | Discharge status: Home / Self Care | Payer: Medicare Other | Source: Ambulatory Visit | Attending: Obstetrics & Gynecology | Admitting: Obstetrics & Gynecology

## 2014-10-07 ENCOUNTER — Encounter (HOSPITAL_COMMUNITY): Payer: Self-pay | Admitting: *Deleted

## 2014-10-07 ENCOUNTER — Encounter: Payer: Medicare Other | Admitting: Family

## 2014-10-07 DIAGNOSIS — Z3A16 16 weeks gestation of pregnancy: Secondary | ICD-10-CM | POA: Diagnosis not present

## 2014-10-07 DIAGNOSIS — O26892 Other specified pregnancy related conditions, second trimester: Secondary | ICD-10-CM | POA: Insufficient documentation

## 2014-10-07 DIAGNOSIS — G43009 Migraine without aura, not intractable, without status migrainosus: Secondary | ICD-10-CM

## 2014-10-07 DIAGNOSIS — O9989 Other specified diseases and conditions complicating pregnancy, childbirth and the puerperium: Secondary | ICD-10-CM

## 2014-10-07 DIAGNOSIS — Z87891 Personal history of nicotine dependence: Secondary | ICD-10-CM | POA: Insufficient documentation

## 2014-10-07 DIAGNOSIS — R51 Headache: Secondary | ICD-10-CM | POA: Insufficient documentation

## 2014-10-07 MED ORDER — ACETAMINOPHEN 325 MG PO TABS
650.0000 mg | ORAL_TABLET | Freq: Once | ORAL | Status: AC
  Administered 2014-10-07: 650 mg via ORAL
  Filled 2014-10-07: qty 2

## 2014-10-07 MED ORDER — SUMATRIPTAN SUCCINATE 100 MG PO TABS: 100.0000 mg | ORAL_TABLET | Freq: Once | ORAL | Status: DC | PRN

## 2014-10-07 NOTE — MAU Provider Note (Signed)
History     CSN: [id number]  Arrival date and time: 10/07/14 1120   First Provider Initiated Contact with Patient 10/07/14 1218      Chief Complaint  Patient presents with  . Headache   HPI Comments: Dana Parks 29 y.o. G2P1001 [redacted]w[redacted]d presents to MAU with right sided migraine that has been ongoing off and on for one week. She admits her life is stressful right now. She has not taken any medications for her migraine. She missed her Clinic appointment today. She has been taking her Keppra for her seizures. She denies any issues with pregnancy.  Headache       Past Medical History  Diagnosis Date  . Cerebral palsy   . Seizure disorder   . Asthma   . Low sodium diet   . Headache(784.0)     frequently  . Seizures     last one in high school;takes Depakote daily  . Numbness     bilateral feet  . Muscle spasm     takes Flexeril daily  . Peripheral edema     occasionally  . Back pain     unknown   . Anxiety   . Liver disease   . Cerebral palsy     Past Surgical History  Procedure Laterality Date  . Leg surgery Left   . Bilateral leg surgery as a child    . Cesarean section  2007  . Tooth extraction N/A 02/16/2013    Procedure: EXTRACTION 16, 17, 32;  Surgeon: Georgia Lopes, DDS;  Location: [facility] OR;  Service: Oral Surgery;  Laterality: N/A;    Family History  Problem Relation Age of Onset  . Diabetes Maternal Grandfather   . Hypertension Maternal Grandfather     History  Substance Use Topics  . Smoking status: Former Smoker -- 0.25 packs/day for 7 years    Types: Cigarettes  . Smokeless tobacco: Never Used  . Alcohol Use: No    Allergies:  Allergies  Allergen Reactions  . Shrimp [Shellfish Allergy] Anaphylaxis    Can eat other shellfish  . Codeine Other (See Comments)    GI upset  . Dust Mite Extract Other (See Comments)    Unknown reaction  . Mold Extract [Trichophyton] Other (See Comments)    Unknown reaction  . Penicillins Other (See Comments)     Unknown reaction  . Soap Other (See Comments)    Unknown reaction    Prescriptions prior to admission  Medication Sig Dispense Refill Last Dose  . albuterol (PROVENTIL HFA;VENTOLIN HFA) 108 (90 BASE) MCG/ACT inhaler Inhale 2 puffs into the lungs every 6 (six) hours as needed for wheezing (wheezing). 1 Inhaler 5 10/06/2014 at Unknown time  . fluticasone (FLONASE) 50 MCG/ACT nasal spray Place 1 spray into both nostrils daily as needed for allergies or rhinitis.   Past Week at Unknown time  . levETIRAcetam (KEPPRA) 500 MG tablet Take 1 tablet (500 mg total) by mouth 2 (two) times daily. 60 tablet 11 10/06/2014 at Unknown time  . Prenatal Multivit-Min-Fe-FA (PRENATAL VITAMINS) 0.8 MG tablet Take 1 tablet by mouth daily. 30 tablet 12 10/06/2014 at Unknown time  . promethazine (PHENERGAN) 25 MG tablet Take 1 tablet (25 mg total) by mouth every 6 (six) hours as needed for nausea or vomiting. 30 tablet 2 10/06/2014 at Unknown time    Review of Systems  Constitutional: Negative.   Eyes: Negative.   Respiratory: Negative.   Cardiovascular: Negative.   Gastrointestinal: Negative.   Genitourinary:  Negative.   Musculoskeletal: Negative.   Skin: Negative.   Neurological: Negative.   Psychiatric/Behavioral: Negative.    Physical Exam   Blood pressure 122/79, pulse 103, temperature 98.3 F (36.8 C), temperature source Oral, resp. rate 18, last menstrual period 06/25/2014.  Physical Exam  Constitutional: She is oriented to person, place, and time. She appears well-developed and well-nourished. No distress.  Has CP  HENT:  Head: Normocephalic and atraumatic.  Eyes: Pupils are equal, round, and reactive to light.  Cardiovascular: Normal rate, regular rhythm and normal heart sounds.   Respiratory: Effort normal and breath sounds normal. No respiratory distress. She has no wheezes.  GI: Soft. Bowel sounds are normal. She exhibits no distension. There is no tenderness. There is no rebound.   Neurological: She is alert and oriented to person, place, and time.  Skin: Skin is warm and dry.  Psychiatric: She has a normal mood and affect. Her behavior is normal. Judgment and thought content normal.    MAU Course  Procedures  MDM  She is allergic to codeine  Will give tylenol in MAU  Assessment and Plan   A: Migraine headache  P: Imitrex 100 mg po prn migraine As she lacks transportation/ will try to have her see Nada MaclachlanKaren Teague-Clark in Clinic for migraines if problem unresolved with Imitrex Advised to increase fluids/ rest/ resolve stressful issues Return to MAU as needed  Carolynn ServeBarefoot, [place] Skalicky Miller 10/07/2014, 12:33 PM

## 2014-10-07 NOTE — MAU Note (Signed)
Pt missed her clinic appt today, states she has had a migraine HA for the last week, is on the R side.  Has nausea, had blurry vision a couple of days ago.  States her HA is intemittent today.  Denies abd pain, vaginal bleeding or discharge.

## 2014-10-07 NOTE — Telephone Encounter (Signed)
Patient called into Clinic, informing me that she is running late and needs to be seen. Informed patient that if she arrived past 20 minute of the allowable time we would have to reschedule her appointment. Patient stated that she needed to be seen because she has been feeling 'stuff' all week. Informed patient that if she was not feeling well and considers it to be an emergency she could go to Maternity Admissions. Patient disconnected call, then she recalled and again informed me that she needed to be seen. She then informed me that she has been constipated for past week, I offered patient to talk to nurse and she agreed and I placed her on hold. As I attempted to obtain nurse for assistance patient disconnected call.

## 2014-10-07 NOTE — Discharge Instructions (Signed)

## 2014-10-08 ENCOUNTER — Encounter: Payer: Self-pay | Admitting: *Deleted

## 2014-10-18 ENCOUNTER — Ambulatory Visit (INDEPENDENT_AMBULATORY_CARE_PROVIDER_SITE_OTHER): Payer: Medicare Other | Admitting: Obstetrics & Gynecology

## 2014-10-18 VITALS — BP 132/75 | HR 104 | Temp 98.0°F | Wt 141.6 lb

## 2014-10-18 DIAGNOSIS — O3421 Maternal care for scar from previous cesarean delivery: Secondary | ICD-10-CM

## 2014-10-18 DIAGNOSIS — O34219 Maternal care for unspecified type scar from previous cesarean delivery: Secondary | ICD-10-CM

## 2014-10-18 DIAGNOSIS — G809 Cerebral palsy, unspecified: Secondary | ICD-10-CM

## 2014-10-18 DIAGNOSIS — O99350 Diseases of the nervous system complicating pregnancy, unspecified trimester: Secondary | ICD-10-CM

## 2014-10-18 DIAGNOSIS — O0992 Supervision of high risk pregnancy, unspecified, second trimester: Secondary | ICD-10-CM

## 2014-10-18 DIAGNOSIS — G40909 Epilepsy, unspecified, not intractable, without status epilepticus: Secondary | ICD-10-CM

## 2014-10-18 LAB — POCT URINALYSIS DIP (DEVICE)
GLUCOSE, UA: NEGATIVE mg/dL
Hgb urine dipstick: NEGATIVE
LEUKOCYTES UA: NEGATIVE
Nitrite: NEGATIVE
PH: 5.5 (ref 5.0–8.0)
Protein, ur: 30 mg/dL — AB
Specific Gravity, Urine: >=1.03 (ref 1.005–1.030)
Urobilinogen, UA: 4 mg/dL — ABNORMAL HIGH (ref 0.0–1.0)

## 2014-10-18 NOTE — Progress Notes (Signed)
Hemoglobin electrophoresis and AFP only draw today Follow up anatomy scan ordered; had inadequate one at 15 weeks No other complaints or concerns.  Routine obstetric precautions reviewed.

## 2014-10-18 NOTE — Progress Notes (Signed)
Follow up U/S 11/20/14 @ 1130a with Radiology.

## 2014-10-19 LAB — CULTURE, OB URINE: Colony Count: 70000

## 2014-10-20 ENCOUNTER — Other Ambulatory Visit: Payer: Self-pay | Admitting: Obstetrics & Gynecology

## 2014-10-20 ENCOUNTER — Ambulatory Visit (HOSPITAL_COMMUNITY)
Admission: RE | Admit: 2014-10-20 | Discharge: 2014-10-20 | Disposition: A | Discharge status: Home / Self Care | Payer: Medicare Other | Source: Ambulatory Visit | Attending: Obstetrics & Gynecology | Admitting: Obstetrics & Gynecology

## 2014-10-20 DIAGNOSIS — G809 Cerebral palsy, unspecified: Secondary | ICD-10-CM

## 2014-10-20 DIAGNOSIS — O99350 Diseases of the nervous system complicating pregnancy, unspecified trimester: Secondary | ICD-10-CM

## 2014-10-20 DIAGNOSIS — Z3A19 19 weeks gestation of pregnancy: Secondary | ICD-10-CM | POA: Diagnosis not present

## 2014-10-20 DIAGNOSIS — O4440 Low lying placenta NOS or without hemorrhage, unspecified trimester: Secondary | ICD-10-CM | POA: Insufficient documentation

## 2014-10-20 DIAGNOSIS — Z36 Encounter for antenatal screening of mother: Secondary | ICD-10-CM | POA: Insufficient documentation

## 2014-10-20 DIAGNOSIS — O09292 Supervision of pregnancy with other poor reproductive or obstetric history, second trimester: Secondary | ICD-10-CM | POA: Diagnosis not present

## 2014-10-20 DIAGNOSIS — G40909 Epilepsy, unspecified, not intractable, without status epilepticus: Secondary | ICD-10-CM

## 2014-10-20 DIAGNOSIS — Z0489 Encounter for examination and observation for other specified reasons: Secondary | ICD-10-CM | POA: Insufficient documentation

## 2014-10-20 DIAGNOSIS — O3421 Maternal care for scar from previous cesarean delivery: Secondary | ICD-10-CM | POA: Diagnosis not present

## 2014-10-20 DIAGNOSIS — IMO0002 Reserved for concepts with insufficient information to code with codable children: Secondary | ICD-10-CM | POA: Insufficient documentation

## 2014-10-20 DIAGNOSIS — O34219 Maternal care for unspecified type scar from previous cesarean delivery: Secondary | ICD-10-CM

## 2014-10-20 LAB — HEMOGLOBINOPATHY EVALUATION
HEMOGLOBIN OTHER: 0 %
Hgb A2 Quant: 2.8 % (ref 2.2–3.2)
Hgb A: 96.4 % — ABNORMAL LOW (ref 96.8–97.8)
Hgb F Quant: 0.8 % (ref 0.0–2.0)
Hgb S Quant: 0 %

## 2014-10-21 LAB — ALPHA FETOPROTEIN, MATERNAL
AFP: 64.8 ng/mL
CURR GEST AGE: 18.6 wks.days
MOM FOR AFP: 1.13
Open Spina bifida: NEGATIVE
Osb Risk: 1:17200 {titer}

## 2014-10-25 ENCOUNTER — Ambulatory Visit: Payer: Medicare Other | Admitting: Neurology

## 2014-10-25 ENCOUNTER — Telehealth: Payer: Self-pay | Admitting: *Deleted

## 2014-10-25 NOTE — Telephone Encounter (Signed)
Talked with patient and rescheduled her appointment for today to 3/28 at 2:00pm. Pt verbalized understanding.

## 2014-11-01 ENCOUNTER — Ambulatory Visit: Payer: Medicare Other | Admitting: Neurology

## 2014-11-02 ENCOUNTER — Encounter: Payer: Self-pay | Admitting: Neurology

## 2014-11-15 ENCOUNTER — Ambulatory Visit (INDEPENDENT_AMBULATORY_CARE_PROVIDER_SITE_OTHER): Payer: Medicare Other | Admitting: Obstetrics & Gynecology

## 2014-11-15 VITALS — BP 132/75 | HR 103 | Wt 144.6 lb

## 2014-11-15 DIAGNOSIS — O34219 Maternal care for unspecified type scar from previous cesarean delivery: Secondary | ICD-10-CM

## 2014-11-15 DIAGNOSIS — Z3492 Encounter for supervision of normal pregnancy, unspecified, second trimester: Secondary | ICD-10-CM

## 2014-11-15 DIAGNOSIS — O3421 Maternal care for scar from previous cesarean delivery: Secondary | ICD-10-CM

## 2014-11-15 LAB — POCT URINALYSIS DIP (DEVICE)
Bilirubin Urine: NEGATIVE
GLUCOSE, UA: NEGATIVE mg/dL
Hgb urine dipstick: NEGATIVE
Ketones, ur: NEGATIVE mg/dL
NITRITE: NEGATIVE
PROTEIN: NEGATIVE mg/dL
SPECIFIC GRAVITY, URINE: 1.02 (ref 1.005–1.030)
Urobilinogen, UA: 2 mg/dL — ABNORMAL HIGH (ref 0.0–1.0)
pH: 7 (ref 5.0–8.0)

## 2014-11-15 NOTE — Progress Notes (Signed)
Needs to restart Keppra, stopped 2 weeks ago. Wants to sign papers for BTL

## 2014-11-15 NOTE — Progress Notes (Signed)
U/S 11/29/14 @ 130p with Radiology.

## 2014-11-29 ENCOUNTER — Other Ambulatory Visit: Payer: Self-pay | Admitting: Obstetrics & Gynecology

## 2014-11-29 ENCOUNTER — Ambulatory Visit (HOSPITAL_COMMUNITY)
Admission: RE | Admit: 2014-11-29 | Discharge: 2014-11-29 | Disposition: A | Discharge status: Home / Self Care | Payer: Medicare Other | Source: Ambulatory Visit | Attending: Obstetrics & Gynecology | Admitting: Obstetrics & Gynecology

## 2014-11-29 DIAGNOSIS — O99350 Diseases of the nervous system complicating pregnancy, unspecified trimester: Secondary | ICD-10-CM | POA: Insufficient documentation

## 2014-11-29 DIAGNOSIS — G40909 Epilepsy, unspecified, not intractable, without status epilepticus: Secondary | ICD-10-CM | POA: Insufficient documentation

## 2014-11-29 DIAGNOSIS — O3421 Maternal care for scar from previous cesarean delivery: Secondary | ICD-10-CM | POA: Insufficient documentation

## 2014-11-29 DIAGNOSIS — Z36 Encounter for antenatal screening of mother: Secondary | ICD-10-CM | POA: Insufficient documentation

## 2014-11-29 DIAGNOSIS — O34219 Maternal care for unspecified type scar from previous cesarean delivery: Secondary | ICD-10-CM

## 2014-11-29 DIAGNOSIS — Z3A24 24 weeks gestation of pregnancy: Secondary | ICD-10-CM | POA: Diagnosis not present

## 2014-12-13 ENCOUNTER — Encounter: Payer: Self-pay | Admitting: *Deleted

## 2014-12-13 ENCOUNTER — Encounter: Payer: Self-pay | Admitting: Family Medicine

## 2014-12-13 ENCOUNTER — Ambulatory Visit (INDEPENDENT_AMBULATORY_CARE_PROVIDER_SITE_OTHER): Payer: Medicare Other | Admitting: Family Medicine

## 2014-12-13 VITALS — BP 140/85 | HR 129 | Temp 98.2°F | Wt 147.6 lb

## 2014-12-13 DIAGNOSIS — O3421 Maternal care for scar from previous cesarean delivery: Secondary | ICD-10-CM | POA: Diagnosis not present

## 2014-12-13 DIAGNOSIS — Z23 Encounter for immunization: Secondary | ICD-10-CM | POA: Diagnosis not present

## 2014-12-13 DIAGNOSIS — Z3492 Encounter for supervision of normal pregnancy, unspecified, second trimester: Secondary | ICD-10-CM | POA: Diagnosis not present

## 2014-12-13 DIAGNOSIS — O34219 Maternal care for unspecified type scar from previous cesarean delivery: Secondary | ICD-10-CM

## 2014-12-13 DIAGNOSIS — J4520 Mild intermittent asthma, uncomplicated: Secondary | ICD-10-CM | POA: Insufficient documentation

## 2014-12-13 LAB — POCT URINALYSIS DIP (DEVICE)
Bilirubin Urine: NEGATIVE
Glucose, UA: NEGATIVE mg/dL
Hgb urine dipstick: NEGATIVE
Ketones, ur: NEGATIVE mg/dL
LEUKOCYTES UA: NEGATIVE
NITRITE: NEGATIVE
Protein, ur: 30 mg/dL — AB
Specific Gravity, Urine: >=1.03 (ref 1.005–1.030)
Urobilinogen, UA: 2 mg/dL — ABNORMAL HIGH (ref 0.0–1.0)
pH: 6 (ref 5.0–8.0)

## 2014-12-13 MED ORDER — TETANUS-DIPHTH-ACELL PERTUSSIS 5-2.5-18.5 LF-MCG/0.5 IM SUSP
0.5000 mL | Freq: Once | INTRAMUSCULAR | Status: AC
  Administered 2014-12-13: 0.5 mL via INTRAMUSCULAR

## 2014-12-13 NOTE — Progress Notes (Signed)
Doing well Primary MD says no Keppra needed as pt. Has not had a seizure in many years. 28 wk labs and TDaP today BTL papers today

## 2014-12-13 NOTE — Progress Notes (Signed)
States was told not to take keppra or sumitrex for now. C/o pressure off and on in " private area".

## 2014-12-13 NOTE — Progress Notes (Signed)
BTL consent reviewed, signature obtained.

## 2014-12-13 NOTE — Patient Instructions (Signed)
Second Trimester of Pregnancy The second trimester is from week 13 through week 28, months 4 through 6. The second trimester is often a time when you feel your best. Your body has also adjusted to being pregnant, and you begin to feel better physically. Usually, morning sickness has lessened or quit completely, you may have more energy, and you may have an increase in appetite. The second trimester is also a time when the fetus is growing rapidly. At the end of the sixth month, the fetus is about 9 inches long and weighs about 1 pounds. You will likely begin to feel the baby move (quickening) between 18 and 20 weeks of the pregnancy. BODY CHANGES Your body goes through many changes during pregnancy. The changes vary from woman to woman.   Your weight will continue to increase. You will notice your lower abdomen bulging out.  You may begin to get stretch marks on your hips, abdomen, and breasts.  You may develop headaches that can be relieved by medicines approved by your health care provider.  You may urinate more often because the fetus is pressing on your bladder.  You may develop or continue to have heartburn as a result of your pregnancy.  You may develop constipation because certain hormones are causing the muscles that push waste through your intestines to slow down.  You may develop hemorrhoids or swollen, bulging veins (varicose veins).  You may have back pain because of the weight gain and pregnancy hormones relaxing your joints between the bones in your pelvis and as a result of a shift in weight and the muscles that support your balance.  Your breasts will continue to grow and be tender.  Your gums may bleed and may be sensitive to brushing and flossing.  Dark spots or blotches (chloasma, mask of pregnancy) may develop on your face. This will likely fade after the baby is born.  A dark line from your belly button to the pubic area (linea nigra) may appear. This will likely  fade after the baby is born.  You may have changes in your hair. These can include thickening of your hair, rapid growth, and changes in texture. Some women also have hair loss during or after pregnancy, or hair that feels dry or thin. Your hair will most likely return to normal after your baby is born. WHAT TO EXPECT AT YOUR PRENATAL VISITS During a routine prenatal visit:  You will be weighed to make sure you and the fetus are growing normally.  Your blood pressure will be taken.  Your abdomen will be measured to track your baby's growth.  The fetal heartbeat will be listened to.  Any test results from the previous visit will be discussed. Your health care provider may ask you:  How you are feeling.  If you are feeling the baby move.  If you have had any abnormal symptoms, such as leaking fluid, bleeding, severe headaches, or abdominal cramping.  If you have any questions. Other tests that may be performed during your second trimester include:  Blood tests that check for:  Low iron levels (anemia).  Gestational diabetes (between 24 and 28 weeks).  Rh antibodies.  Urine tests to check for infections, diabetes, or protein in the urine.  An ultrasound to confirm the proper growth and development of the baby.  An amniocentesis to check for possible genetic problems.  Fetal screens for spina bifida and Down syndrome. HOME CARE INSTRUCTIONS   Avoid all smoking, herbs, alcohol, and unprescribed   drugs. These chemicals affect the formation and growth of the baby.  Follow your health care provider's instructions regarding medicine use. There are medicines that are either safe or unsafe to take during pregnancy.  Exercise only as directed by your health care provider. Experiencing uterine cramps is a good sign to stop exercising.  Continue to eat regular, healthy meals.  Wear a good support bra for breast tenderness.  Do not use hot tubs, steam rooms, or saunas.  Wear  your seat belt at all times when driving.  Avoid raw meat, uncooked cheese, cat litter boxes, and soil used by cats. These carry germs that can cause birth defects in the baby.  Take your prenatal vitamins.  Try taking a stool softener (if your health care provider approves) if you develop constipation. Eat more high-fiber foods, such as fresh vegetables or fruit and whole grains. Drink plenty of fluids to keep your urine clear or pale yellow.  Take warm sitz baths to soothe any pain or discomfort caused by hemorrhoids. Use hemorrhoid cream if your health care provider approves.  If you develop varicose veins, wear support hose. Elevate your feet for 15 minutes, 3-4 times a day. Limit salt in your diet.  Avoid heavy lifting, wear low heel shoes, and practice good posture.  Rest with your legs elevated if you have leg cramps or low back pain.  Visit your dentist if you have not gone yet during your pregnancy. Use a soft toothbrush to brush your teeth and be gentle when you floss.  A sexual relationship may be continued unless your health care provider directs you otherwise.  Continue to go to all your prenatal visits as directed by your health care provider. SEEK MEDICAL CARE IF:   You have dizziness.  You have mild pelvic cramps, pelvic pressure, or nagging pain in the abdominal area.  You have persistent nausea, vomiting, or diarrhea.  You have a bad smelling vaginal discharge.  You have pain with urination. SEEK IMMEDIATE MEDICAL CARE IF:   You have a fever.  You are leaking fluid from your vagina.  You have spotting or bleeding from your vagina.  You have severe abdominal cramping or pain.  You have rapid weight gain or loss.  You have shortness of breath with chest pain.  You notice sudden or extreme swelling of your face, hands, ankles, feet, or legs.  You have not felt your baby move in over an hour.  You have severe headaches that do not go away with  medicine.  You have vision changes. Document Released: 07/17/2001 Document Revised: 07/28/2013 Document Reviewed: 09/23/2012 [employer] Patient Information 2015 [employer]. This information is not intended to replace advice given to you by your health care provider. Make sure you discuss any questions you have with your health care provider.  Breastfeeding Deciding to breastfeed is one of the best choices you can make for you and your baby. A change in hormones during pregnancy causes your breast tissue to grow and increases the number and size of your milk ducts. These hormones also allow proteins, sugars, and fats from your blood supply to make breast milk in your milk-producing glands. Hormones prevent breast milk from being released before your baby is born as well as prompt milk flow after birth. Once breastfeeding has begun, thoughts of your baby, as well as his or her sucking or crying, can stimulate the release of milk from your milk-producing glands.  BENEFITS OF BREASTFEEDING For Your Baby  Your first   milk (colostrum) helps your baby's digestive system function better.   There are antibodies in your milk that help your baby fight off infections.   Your baby has a lower incidence of asthma, allergies, and sudden infant death syndrome.   The nutrients in breast milk are better for your baby than infant formulas and are designed uniquely for your baby's needs.   Breast milk improves your baby's brain development.   Your baby is less likely to develop other conditions, such as childhood obesity, asthma, or type 2 diabetes mellitus.  For You   Breastfeeding helps to create a very special bond between you and your baby.   Breastfeeding is convenient. Breast milk is always available at the correct temperature and costs nothing.   Breastfeeding helps to burn calories and helps you lose the weight gained during pregnancy.   Breastfeeding makes your uterus contract to its  prepregnancy size faster and slows bleeding (lochia) after you give birth.   Breastfeeding helps to lower your risk of developing type 2 diabetes mellitus, osteoporosis, and breast or ovarian cancer later in life. SIGNS THAT YOUR BABY IS HUNGRY Early Signs of Hunger  Increased alertness or activity.  Stretching.  Movement of the head from side to side.  Movement of the head and opening of the mouth when the corner of the mouth or cheek is stroked (rooting).  Increased sucking sounds, smacking lips, cooing, sighing, or squeaking.  Hand-to-mouth movements.  Increased sucking of fingers or hands. Late Signs of Hunger  Fussing.  Intermittent crying. Extreme Signs of Hunger Signs of extreme hunger will require calming and consoling before your baby will be able to breastfeed successfully. Do not wait for the following signs of extreme hunger to occur before you initiate breastfeeding:   Restlessness.  A loud, strong cry.   Screaming. BREASTFEEDING BASICS Breastfeeding Initiation  Find a comfortable place to sit or lie down, with your neck and back well supported.  Place a pillow or rolled up blanket under your baby to bring him or her to the level of your breast (if you are seated). Nursing pillows are specially designed to help support your arms and your baby while you breastfeed.  Make sure that your baby's abdomen is facing your abdomen.   Gently massage your breast. With your fingertips, massage from your chest wall toward your nipple in a circular motion. This encourages milk flow. You may need to continue this action during the feeding if your milk flows slowly.  Support your breast with 4 fingers underneath and your thumb above your nipple. Make sure your fingers are well away from your nipple and your baby's mouth.   Stroke your baby's lips gently with your finger or nipple.   When your baby's mouth is open wide enough, quickly bring your baby to your breast,  placing your entire nipple and as much of the colored area around your nipple (areola) as possible into your baby's mouth.   More areola should be visible above your baby's upper lip than below the lower lip.   Your baby's tongue should be between his or her lower gum and your breast.   Ensure that your baby's mouth is correctly positioned around your nipple (latched). Your baby's lips should create a seal on your breast and be turned out (everted).  It is common for your baby to suck about 2-3 minutes in order to start the flow of breast milk. Latching Teaching your baby how to latch on to your breast   properly is very important. An improper latch can cause nipple pain and decreased milk supply for you and poor weight gain in your baby. Also, if your baby is not latched onto your nipple properly, he or she may swallow some air during feeding. This can make your baby fussy. Burping your baby when you switch breasts during the feeding can help to get rid of the air. However, teaching your baby to latch on properly is still the best way to prevent fussiness from swallowing air while breastfeeding. Signs that your baby has successfully latched on to your nipple:    Silent tugging or silent sucking, without causing you pain.   Swallowing heard between every 3-4 sucks.    Muscle movement above and in front of his or her ears while sucking.  Signs that your baby has not successfully latched on to nipple:   Sucking sounds or smacking sounds from your baby while breastfeeding.  Nipple pain. If you think your baby has not latched on correctly, slip your finger into the corner of your baby's mouth to break the suction and place it between your baby's gums. Attempt breastfeeding initiation again. Signs of Successful Breastfeeding Signs from your baby:   A gradual decrease in the number of sucks or complete cessation of sucking.   Falling asleep.   Relaxation of his or her body.    Retention of a small amount of milk in his or her mouth.   Letting go of your breast by himself or herself. Signs from you:  Breasts that have increased in firmness, weight, and size 1-3 hours after feeding.   Breasts that are softer immediately after breastfeeding.  Increased milk volume, as well as a change in milk consistency and color by the fifth day of breastfeeding.   Nipples that are not sore, cracked, or bleeding. Signs That Your Baby is Getting Enough Milk  Wetting at least 3 diapers in a 24-hour period. The urine should be clear and pale yellow by age 5 days.  At least 3 stools in a 24-hour period by age 5 days. The stool should be soft and yellow.  At least 3 stools in a 24-hour period by age 7 days. The stool should be seedy and yellow.  No loss of weight greater than 10% of birth weight during the first 3 days of age.  Average weight gain of 4-7 ounces (113-198 g) per week after age 4 days.  Consistent daily weight gain by age 5 days, without weight loss after the age of 2 weeks. After a feeding, your baby may spit up a small amount. This is common. BREASTFEEDING FREQUENCY AND DURATION Frequent feeding will help you make more milk and can prevent sore nipples and breast engorgement. Breastfeed when you feel the need to reduce the fullness of your breasts or when your baby shows signs of hunger. This is called "breastfeeding on demand." Avoid introducing a pacifier to your baby while you are working to establish breastfeeding (the first 4-6 weeks after your baby is born). After this time you may choose to use a pacifier. Research has shown that pacifier use during the first year of a baby's life decreases the risk of sudden infant death syndrome (SIDS). Allow your baby to feed on each breast as long as he or she wants. Breastfeed until your baby is finished feeding. When your baby unlatches or falls asleep while feeding from the first breast, offer the second breast.  Because newborns are often sleepy in the   first few weeks of life, you may need to awaken your baby to get him or her to feed. Breastfeeding times will vary from baby to baby. However, the following rules can serve as a guide to help you ensure that your baby is properly fed:  Newborns (babies 4 weeks of age or younger) may breastfeed every 1-3 hours.  Newborns should not go longer than 3 hours during the day or 5 hours during the night without breastfeeding.  You should breastfeed your baby a minimum of 8 times in a 24-hour period until you begin to introduce solid foods to your baby at around 6 months of age. BREAST MILK PUMPING Pumping and storing breast milk allows you to ensure that your baby is exclusively fed your breast milk, even at times when you are unable to breastfeed. This is especially important if you are going back to work while you are still breastfeeding or when you are not able to be present during feedings. Your lactation consultant can give you guidelines on how long it is safe to store breast milk.  A breast pump is a machine that allows you to pump milk from your breast into a sterile bottle. The pumped breast milk can then be stored in a refrigerator or freezer. Some breast pumps are operated by hand, while others use electricity. Ask your lactation consultant which type will work best for you. Breast pumps can be purchased, but some hospitals and breastfeeding support groups lease breast pumps on a monthly basis. A lactation consultant can teach you how to hand express breast milk, if you prefer not to use a pump.  CARING FOR YOUR BREASTS WHILE YOU BREASTFEED Nipples can become dry, cracked, and sore while breastfeeding. The following recommendations can help keep your breasts moisturized and healthy:  Avoid using soap on your nipples.   Wear a supportive bra. Although not required, special nursing bras and tank tops are designed to allow access to your breasts for  breastfeeding without taking off your entire bra or top. Avoid wearing underwire-style bras or extremely tight bras.  Air dry your nipples for 3-4minutes after each feeding.   Use only cotton bra pads to absorb leaked breast milk. Leaking of breast milk between feedings is normal.   Use lanolin on your nipples after breastfeeding. Lanolin helps to maintain your skin's normal moisture barrier. If you use pure lanolin, you do not need to wash it off before feeding your baby again. Pure lanolin is not toxic to your baby. You may also hand express a few drops of breast milk and gently massage that milk into your nipples and allow the milk to air dry. In the first few weeks after giving birth, some women experience extremely full breasts (engorgement). Engorgement can make your breasts feel heavy, warm, and tender to the touch. Engorgement peaks within 3-5 days after you give birth. The following recommendations can help ease engorgement:  Completely empty your breasts while breastfeeding or pumping. You may want to start by applying warm, moist heat (in the shower or with warm water-soaked hand towels) just before feeding or pumping. This increases circulation and helps the milk flow. If your baby does not completely empty your breasts while breastfeeding, pump any extra milk after he or she is finished.  Wear a snug bra (nursing or regular) or tank top for 1-2 days to signal your body to slightly decrease milk production.  Apply ice packs to your breasts, unless this is too uncomfortable for you.    Make sure that your baby is latched on and positioned properly while breastfeeding. If engorgement persists after 48 hours of following these recommendations, contact your health care provider or a lactation consultant. OVERALL HEALTH CARE RECOMMENDATIONS WHILE BREASTFEEDING  Eat healthy foods. Alternate between meals and snacks, eating 3 of each per day. Because what you eat affects your breast milk,  some of the foods may make your baby more irritable than usual. Avoid eating these foods if you are sure that they are negatively affecting your baby.  Drink milk, fruit juice, and water to satisfy your thirst (about 10 glasses a day).   Rest often, relax, and continue to take your prenatal vitamins to prevent fatigue, stress, and anemia.  Continue breast self-awareness checks.  Avoid chewing and smoking tobacco.  Avoid alcohol and drug use. Some medicines that may be harmful to your baby can pass through breast milk. It is important to ask your health care provider before taking any medicine, including all over-the-counter and prescription medicine as well as vitamin and herbal supplements. It is possible to become pregnant while breastfeeding. If birth control is desired, ask your health care provider about options that will be safe for your baby. SEEK MEDICAL CARE IF:   You feel like you want to stop breastfeeding or have become frustrated with breastfeeding.  You have painful breasts or nipples.  Your nipples are cracked or bleeding.  Your breasts are red, tender, or warm.  You have a swollen area on either breast.  You have a fever or chills.  You have nausea or vomiting.  You have drainage other than breast milk from your nipples.  Your breasts do not become full before feedings by the fifth day after you give birth.  You feel sad and depressed.  Your baby is too sleepy to eat well.  Your baby is having trouble sleeping.   Your baby is wetting less than 3 diapers in a 24-hour period.  Your baby has less than 3 stools in a 24-hour period.  Your baby's skin or the white part of his or her eyes becomes yellow.   Your baby is not gaining weight by 5 days of age. SEEK IMMEDIATE MEDICAL CARE IF:   Your baby is overly tired (lethargic) and does not want to wake up and feed.  Your baby develops an unexplained fever. Document Released: 07/23/2005 Document Revised:  07/28/2013 Document Reviewed: 01/14/2013 [employer] Patient Information 2015 [employer]. This information is not intended to replace advice given to you by your health care provider. Make sure you discuss any questions you have with your health care provider.  

## 2014-12-14 LAB — CBC
HCT: 30.6 % — ABNORMAL LOW (ref 36.0–46.0)
Hemoglobin: 9.7 g/dL — ABNORMAL LOW (ref 12.0–15.0)
MCH: 26.6 pg (ref 26.0–34.0)
MCHC: 31.7 g/dL (ref 30.0–36.0)
MCV: 83.8 fL (ref 78.0–100.0)
MPV: 10.5 fL (ref 8.6–12.4)
PLATELETS: 295 10*3/uL (ref 150–400)
RBC: 3.65 MIL/uL — AB (ref 3.87–5.11)
RDW: 13.8 % (ref 11.5–15.5)
WBC: 6.3 10*3/uL (ref 4.0–10.5)

## 2014-12-14 LAB — HIV ANTIBODY (ROUTINE TESTING W REFLEX): HIV: NONREACTIVE

## 2014-12-14 LAB — RPR

## 2014-12-14 LAB — GLUCOSE TOLERANCE, 1 HOUR (50G) W/O FASTING: Glucose, 1 Hour GTT: 118 mg/dL (ref 70–140)

## 2014-12-27 ENCOUNTER — Ambulatory Visit (INDEPENDENT_AMBULATORY_CARE_PROVIDER_SITE_OTHER): Payer: Medicare Other | Admitting: Family Medicine

## 2014-12-27 VITALS — BP 127/84 | HR 120 | Wt 148.0 lb

## 2014-12-27 DIAGNOSIS — O3421 Maternal care for scar from previous cesarean delivery: Secondary | ICD-10-CM

## 2014-12-27 DIAGNOSIS — O34219 Maternal care for unspecified type scar from previous cesarean delivery: Secondary | ICD-10-CM

## 2014-12-27 DIAGNOSIS — G40909 Epilepsy, unspecified, not intractable, without status epilepticus: Secondary | ICD-10-CM

## 2014-12-27 DIAGNOSIS — O4400 Placenta previa specified as without hemorrhage, unspecified trimester: Secondary | ICD-10-CM

## 2014-12-27 DIAGNOSIS — O99350 Diseases of the nervous system complicating pregnancy, unspecified trimester: Secondary | ICD-10-CM

## 2014-12-27 LAB — POCT URINALYSIS DIP (DEVICE)
Bilirubin Urine: NEGATIVE
GLUCOSE, UA: NEGATIVE mg/dL
Hgb urine dipstick: NEGATIVE
Nitrite: NEGATIVE
PH: 6 (ref 5.0–8.0)
PROTEIN: NEGATIVE mg/dL
SPECIFIC GRAVITY, URINE: 1.025 (ref 1.005–1.030)
Urobilinogen, UA: 1 mg/dL (ref 0.0–1.0)

## 2014-12-27 NOTE — Progress Notes (Signed)
Continues to be off [place] - Reports no seizures. 1hr GTT normal No other concerns.

## 2014-12-27 NOTE — Patient Instructions (Signed)
Third Trimester of Pregnancy The third trimester is from week 29 through week 42, months 7 through 9. The third trimester is a time when the fetus is growing rapidly. At the end of the ninth month, the fetus is about 20 inches in length and weighs 6-10 pounds.  BODY CHANGES Your body goes through many changes during pregnancy. The changes vary from woman to woman.   Your weight will continue to increase. You can expect to gain 25-35 pounds (11-16 kg) by the end of the pregnancy.  You may begin to get stretch marks on your hips, abdomen, and breasts.  You may urinate more often because the fetus is moving lower into your pelvis and pressing on your bladder.  You may develop or continue to have heartburn as a result of your pregnancy.  You may develop constipation because certain hormones are causing the muscles that push waste through your intestines to slow down.  You may develop hemorrhoids or swollen, bulging veins (varicose veins).  You may have pelvic pain because of the weight gain and pregnancy hormones relaxing your joints between the bones in your pelvis. Backaches may result from overexertion of the muscles supporting your posture.  You may have changes in your hair. These can include thickening of your hair, rapid growth, and changes in texture. Some women also have hair loss during or after pregnancy, or hair that feels dry or thin. Your hair will most likely return to normal after your baby is born.  Your breasts will continue to grow and be tender. A yellow discharge may leak from your breasts called colostrum.  Your belly button may stick out.  You may feel short of breath because of your expanding uterus.  You may notice the fetus "dropping," or moving lower in your abdomen.  You may have a bloody mucus discharge. This usually occurs a few days to a week before labor begins.  Your cervix becomes thin and soft (effaced) near your due date. WHAT TO EXPECT AT YOUR PRENATAL  EXAMS  You will have prenatal exams every 2 weeks until week 36. Then, you will have weekly prenatal exams. During a routine prenatal visit:  You will be weighed to make sure you and the fetus are growing normally.  Your blood pressure is taken.  Your abdomen will be measured to track your baby's growth.  The fetal heartbeat will be listened to.  Any test results from the previous visit will be discussed.  You may have a cervical check near your due date to see if you have effaced. At around 36 weeks, your caregiver will check your cervix. At the same time, your caregiver will also perform a test on the secretions of the vaginal tissue. This test is to determine if a type of bacteria, Group B streptococcus, is present. Your caregiver will explain this further. Your caregiver may ask you:  What your birth plan is.  How you are feeling.  If you are feeling the baby move.  If you have had any abnormal symptoms, such as leaking fluid, bleeding, severe headaches, or abdominal cramping.  If you have any questions. Other tests or screenings that may be performed during your third trimester include:  Blood tests that check for low iron levels (anemia).  Fetal testing to check the health, activity level, and growth of the fetus. Testing is done if you have certain medical conditions or if there are problems during the pregnancy. FALSE LABOR You may feel small, irregular contractions that   eventually go away. These are called Braxton Hicks contractions, or false labor. Contractions may last for hours, days, or even weeks before true labor sets in. If contractions come at regular intervals, intensify, or become painful, it is best to be seen by your caregiver.  SIGNS OF LABOR   Menstrual-like cramps.  Contractions that are 5 minutes apart or less.  Contractions that start on the top of the uterus and spread down to the lower abdomen and back.  A sense of increased pelvic pressure or back  pain.  A watery or bloody mucus discharge that comes from the vagina. If you have any of these signs before the 37th week of pregnancy, call your caregiver right away. You need to go to the hospital to get checked immediately. HOME CARE INSTRUCTIONS   Avoid all smoking, herbs, alcohol, and unprescribed drugs. These chemicals affect the formation and growth of the baby.  Follow your caregiver's instructions regarding medicine use. There are medicines that are either safe or unsafe to take during pregnancy.  Exercise only as directed by your caregiver. Experiencing uterine cramps is a good sign to stop exercising.  Continue to eat regular, healthy meals.  Wear a good support bra for breast tenderness.  Do not use hot tubs, steam rooms, or saunas.  Wear your seat belt at all times when driving.  Avoid raw meat, uncooked cheese, cat litter boxes, and soil used by cats. These carry germs that can cause birth defects in the baby.  Take your prenatal vitamins.  Try taking a stool softener (if your caregiver approves) if you develop constipation. Eat more high-fiber foods, such as fresh vegetables or fruit and whole grains. Drink plenty of fluids to keep your urine clear or pale yellow.  Take warm sitz baths to soothe any pain or discomfort caused by hemorrhoids. Use hemorrhoid cream if your caregiver approves.  If you develop varicose veins, wear support hose. Elevate your feet for 15 minutes, 3-4 times a day. Limit salt in your diet.  Avoid heavy lifting, wear low heal shoes, and practice good posture.  Rest a lot with your legs elevated if you have leg cramps or low back pain.  Visit your dentist if you have not gone during your pregnancy. Use a soft toothbrush to brush your teeth and be gentle when you floss.  A sexual relationship may be continued unless your caregiver directs you otherwise.  Do not travel far distances unless it is absolutely necessary and only with the approval  of your caregiver.  Take prenatal classes to understand, practice, and ask questions about the labor and delivery.  Make a trial run to the hospital.  Pack your hospital bag.  Prepare the baby's nursery.  Continue to go to all your prenatal visits as directed by your caregiver. SEEK MEDICAL CARE IF:  You are unsure if you are in labor or if your water has broken.  You have dizziness.  You have mild pelvic cramps, pelvic pressure, or nagging pain in your abdominal area.  You have persistent nausea, vomiting, or diarrhea.  You have a bad smelling vaginal discharge.  You have pain with urination. SEEK IMMEDIATE MEDICAL CARE IF:   You have a fever.  You are leaking fluid from your vagina.  You have spotting or bleeding from your vagina.  You have severe abdominal cramping or pain.  You have rapid weight loss or gain.  You have shortness of breath with chest pain.  You notice sudden or extreme swelling   of your face, hands, ankles, feet, or legs.  You have not felt your baby move in over an hour.  You have severe headaches that do not go away with medicine.  You have vision changes. Document Released: 07/17/2001 Document Revised: 07/28/2013 Document Reviewed: 09/23/2012 Exit[employer] Patient Information 2015 [employer]. This information is not intended to replace advice given to you by your health care provider. Make sure you discuss any questions you have with your health care provider.  

## 2015-01-17 ENCOUNTER — Ambulatory Visit (INDEPENDENT_AMBULATORY_CARE_PROVIDER_SITE_OTHER): Payer: Medicare Other | Admitting: Obstetrics and Gynecology

## 2015-01-17 VITALS — BP 138/80 | HR 126 | Temp 98.1°F | Wt 151.7 lb

## 2015-01-17 DIAGNOSIS — O0993 Supervision of high risk pregnancy, unspecified, third trimester: Secondary | ICD-10-CM

## 2015-01-17 DIAGNOSIS — O3421 Maternal care for scar from previous cesarean delivery: Secondary | ICD-10-CM

## 2015-01-17 DIAGNOSIS — O4403 Placenta previa specified as without hemorrhage, third trimester: Secondary | ICD-10-CM

## 2015-01-17 DIAGNOSIS — J45909 Unspecified asthma, uncomplicated: Secondary | ICD-10-CM

## 2015-01-17 DIAGNOSIS — O34219 Maternal care for unspecified type scar from previous cesarean delivery: Secondary | ICD-10-CM

## 2015-01-17 DIAGNOSIS — O9989 Other specified diseases and conditions complicating pregnancy, childbirth and the puerperium: Secondary | ICD-10-CM

## 2015-01-17 DIAGNOSIS — O99519 Diseases of the respiratory system complicating pregnancy, unspecified trimester: Principal | ICD-10-CM

## 2015-01-17 DIAGNOSIS — G40909 Epilepsy, unspecified, not intractable, without status epilepticus: Secondary | ICD-10-CM

## 2015-01-17 DIAGNOSIS — O99350 Diseases of the nervous system complicating pregnancy, unspecified trimester: Secondary | ICD-10-CM

## 2015-01-17 LAB — POCT URINALYSIS DIP (DEVICE)
Bilirubin Urine: NEGATIVE
Glucose, UA: 100 mg/dL — AB
HGB URINE DIPSTICK: NEGATIVE
KETONES UR: NEGATIVE mg/dL
Leukocytes, UA: NEGATIVE
NITRITE: NEGATIVE
PH: 6 (ref 5.0–8.0)
PROTEIN: NEGATIVE mg/dL
Specific Gravity, Urine: >=1.03 (ref 1.005–1.030)
UROBILINOGEN UA: 1 mg/dL (ref 0.0–1.0)

## 2015-01-17 NOTE — Progress Notes (Signed)
Pt states she is not going to breastfeed, bottlefeed only

## 2015-01-17 NOTE — Progress Notes (Signed)
Subjective:  Dana Parks is a 29 y.o. G2P1001 at [redacted]w[redacted]d being seen today for ongoing prenatal care.  Patient reports no complaints.  Contractions: Not present.  Vag. Bleeding: None. Movement: Present. Denies leaking of fluid.   The following portions of the patient's history were reviewed and updated as appropriate: allergies, current medications, past family history, past medical history, past social history, past surgical history and problem list.   Objective:   Filed Vitals:   01/17/15 1140  BP: 138/80  Pulse: 126  Temp: 98.1 F (36.7 C)  Weight: 151 lb 11.2 oz (68.811 kg)    Fetal Status: Fetal Heart Rate (bpm): 154   Movement: Present     General:  Alert, oriented and cooperative. Patient is in no acute distress.  Skin: Skin is warm and dry. No rash noted.   Cardiovascular: Normal heart rate noted  Respiratory: Effort and breath sounds normal, no problems with respiration noted  Abdomen: Soft, gravid, appropriate for gestational age. Pain/Pressure: Absent     Vaginal: Vag. Bleeding: None.       Cervix: Not evaluated  Extremities: Normal range of motion.  Edema: Trace  Mental Status: Normal mood and affect. Normal behavior. Normal judgment and thought content.   Urinalysis: Urine Protein: Negative Urine Glucose: 1+  Assessment and Plan:  Pregnancy: G2P1001 at [redacted]w[redacted]d  1. Asthma complicating pregnancy, antepartum Mild intermittent. Albuterol prn.   2. Epilepsy affecting pregnancy, antepartum Follows with neurology, but has not seen Stopped her keppra in pregnancy.   3. Low lying placenta without hemorrhage, antepartum, third trimester On most recent scan leading edge 2.5-3 cm from os.  4. Previous cesarean section complicating pregnancy, antepartum condition or complication Desires repeat.    Preterm labor symptoms and general obstetric precautions including but not limited to vaginal bleeding, contractions, leaking of fluid and fetal movement were reviewed in  detail with the patient.  Please refer to After Visit Summary for other counseling recommendations.   Return in about 2 weeks (around 01/31/2015).   William Dalton, MD

## 2015-01-18 ENCOUNTER — Ambulatory Visit (HOSPITAL_COMMUNITY)
Admission: RE | Admit: 2015-01-18 | Discharge: 2015-01-18 | Disposition: A | Discharge status: Home / Self Care | Payer: Medicare Other | Source: Ambulatory Visit | Attending: Obstetrics and Gynecology | Admitting: Obstetrics and Gynecology

## 2015-01-18 DIAGNOSIS — O34219 Maternal care for unspecified type scar from previous cesarean delivery: Secondary | ICD-10-CM

## 2015-01-18 DIAGNOSIS — O99353 Diseases of the nervous system complicating pregnancy, third trimester: Secondary | ICD-10-CM | POA: Diagnosis not present

## 2015-01-18 DIAGNOSIS — O3421 Maternal care for scar from previous cesarean delivery: Secondary | ICD-10-CM | POA: Insufficient documentation

## 2015-01-18 DIAGNOSIS — G40909 Epilepsy, unspecified, not intractable, without status epilepticus: Secondary | ICD-10-CM | POA: Insufficient documentation

## 2015-01-18 DIAGNOSIS — O4403 Placenta previa specified as without hemorrhage, third trimester: Secondary | ICD-10-CM

## 2015-01-18 DIAGNOSIS — Z3A34 34 weeks gestation of pregnancy: Secondary | ICD-10-CM | POA: Insufficient documentation

## 2015-01-18 DIAGNOSIS — O99350 Diseases of the nervous system complicating pregnancy, unspecified trimester: Secondary | ICD-10-CM

## 2015-01-18 DIAGNOSIS — Z3A32 32 weeks gestation of pregnancy: Secondary | ICD-10-CM | POA: Insufficient documentation

## 2015-01-18 DIAGNOSIS — Z364 Encounter for antenatal screening for fetal growth retardation: Secondary | ICD-10-CM | POA: Insufficient documentation

## 2015-02-21 ENCOUNTER — Encounter: Payer: Medicare Other | Admitting: Obstetrics and Gynecology

## 2015-02-25 ENCOUNTER — Ambulatory Visit (INDEPENDENT_AMBULATORY_CARE_PROVIDER_SITE_OTHER): Payer: Medicare Other | Admitting: Neurology

## 2015-02-25 ENCOUNTER — Encounter: Payer: Self-pay | Admitting: Neurology

## 2015-02-25 VITALS — BP 128/80 | HR 80 | Ht 59.0 in

## 2015-02-25 DIAGNOSIS — G809 Cerebral palsy, unspecified: Secondary | ICD-10-CM

## 2015-02-25 DIAGNOSIS — R569 Unspecified convulsions: Secondary | ICD-10-CM

## 2015-02-25 NOTE — Progress Notes (Signed)
NEUROLOGY CONSULTATION NOTE  Dana Parks MRN: [id number] DOB: 1986/04/10  Referring provider: Dr. William Dalton Primary care provider: Dr. Fleet Contras  Reason for consult:  Seizures, pregnancy  Dear Dr Ane Payment:  Thank you for your kind referral of Dana Parks for consultation of the above symptoms. Although her history is well known to you, please allow me to reiterate it for the purpose of our medical record. The patient was accompanied to the clinic by stepmother and 15 year old daughter who also provide collateral information. Records and images were personally reviewed where available.  HISTORY OF PRESENT ILLNESS: This is a 29 year old left-handed woman with a history of cerebral palsy, cognitive impairment, and seizures since childhood, now [redacted] weeks pregnant, presenting for management of seizures in pregnancy. She is accompanied by her stepmother who has only witnessed one seizure while she was at age 8, she became unresponsive then fell to the floor without convulsive activity. She felt funny when she came to. The patient lives with her stepmother, who denies any further seizures since then. They report that she started having seizures in infancy and was diagnosed with cerebral palsy. Seizures consisted of staring followed by convulsive activity, reported to be foaming at the mouth. She denies any olfactory/gustatory hallucinations, deja vu, rising epigastric sensation, focal numbness/tingling/weakness, myoclonic jerks. She has a history of migraines with aura ("seeing a lot of dots"), associated with dizziness. I asked the patient and her stepmother several times about last seizure, as she had been evaluated by neurologist Dr. Lucia Gaskins in February 2016, with report that she had a seizure the week prior to her clinic visit. She and her stepmother report that she did not have a seizure, instead she had a migraine and saw spots. She was under a lot of stress at that time. She had  been taking Depakote for many years, and per Dr. Trevor Mace notes, the patient self-discontinued Depakote when she found out she was pregnant in December 2015. She was prescribed Keppra which she only took that month because "it was too strong." The patient today reports that she did not stop Depakote and was instructed to stop it by her doctor. She reports she was only on low dose Depakote 250mg  1 tablet daily with no seizures since age 50. She had also stopped the medication when she was pregnant with her now 30 year old daughter. She is due for delivery in 2 weeks. She and her stepmother deny any staring/unresponsive episodes, gaps in time, or other seizure-like symptoms off medication for the past 5 months.   She has intermittent headaches lasting 3 minutes or so, with associated nausea. She sometimes loses vision for a few minutes with the headaches, last episode was in February. She has occasional blurred vision, occasional numbness in both hands and feet. No dysarthria, dysphagia, neck pain, bowel/bladder dysfunction. She finished 12th grade in special ed classes.  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 Her uncle has seizures. She had febrile convulsions as a child. There is no history of CNS infections such as meningitis/encephalitis, significant traumatic brain injury, neurosurgical procedures  PAST MEDICAL HISTORY: Past Medical History  Diagnosis Date  . Cerebral palsy   . Seizure disorder   . Asthma   . Low sodium diet   . Headache(784.0)     frequently  . Seizures     last  one in high school;takes Depakote daily  . Numbness     bilateral feet  . Muscle spasm     takes Flexeril daily  . Peripheral edema     occasionally  . Back pain     unknown   . Anxiety   . Liver disease     PAST SURGICAL HISTORY: Past Surgical History  Procedure Laterality Date  . Leg  surgery Bilateral   . Cesarean section  2007  . Tooth extraction N/A 02/16/2013    Procedure: EXTRACTION 16, 17, 32;  Surgeon: Georgia Lopes, DDS;  Location: [facility] OR;  Service: Oral Surgery;  Laterality: N/A;    MEDICATIONS: Current Outpatient Prescriptions on File Prior to Visit  Medication Sig Dispense Refill  . acetaminophen (TYLENOL) 325 MG tablet Take 650 mg by mouth every 6 (six) hours as needed.    Marland Kitchen albuterol (PROVENTIL HFA;VENTOLIN HFA) 108 (90 BASE) MCG/ACT inhaler Inhale 2 puffs into the lungs every 6 (six) hours as needed for wheezing (wheezing). 1 Inhaler 5  . Pediatric Multiple Vitamins (FLINTSTONES MULTIVITAMIN PO) Take 2 tablets by mouth.     No current facility-administered medications on file prior to visit.    ALLERGIES: Allergies  Allergen Reactions  . Shrimp [Shellfish Allergy] Anaphylaxis    Can eat other shellfish  . Codeine Other (See Comments)    GI upset  . Dust Mite Extract Other (See Comments)    Unknown reaction  . Mold Extract [Trichophyton] Other (See Comments)    Unknown reaction  . Penicillins Other (See Comments)    Unknown reaction  . Soap Other (See Comments)    Unknown reaction    FAMILY HISTORY: Family History  Problem Relation Age of Onset  . Diabetes Maternal Grandfather   . Hypertension Maternal Grandfather     SOCIAL HISTORY: History   Social History  . Marital Status: Single    Spouse Name: N/A  . Number of Children: 1  . Years of Education: high schoo   Occupational History  . Disabled    Social History Main Topics  . Smoking status: Former Smoker -- 0.25 packs/day for 7 years    Types: Cigarettes    Quit date: 11/05/2014  . Smokeless tobacco: Never Used  . Alcohol Use: No  . Drug Use: No  . Sexual Activity:    Partners: Male    Birth Control/ Protection: None   Other Topics Concern  . Not on file   Social History Narrative   Lives at home with her sister.   Single.    Has 2 children. Currently pregnant  with 1.    REVIEW OF SYSTEMS: Constitutional: No fevers, chills, or sweats, no generalized fatigue, change in appetite Eyes: No visual changes, double vision, eye pain Ear, nose and throat: No hearing loss, ear pain, nasal congestion, sore throat Cardiovascular: No chest pain, palpitations Respiratory:  No shortness of breath at rest or with exertion, wheezes GastrointestinaI: No nausea, vomiting, diarrhea, abdominal pain, fecal incontinence Genitourinary:  No dysuria, urinary retention or frequency Musculoskeletal:  No neck pain,+ back pain Integumentary: No rash, pruritus, skin lesions Neurological: as above Psychiatric: No depression, insomnia, anxiety Endocrine: No palpitations, fatigue, diaphoresis, mood swings, change in appetite, change in weight, increased thirst Hematologic/Lymphatic:  No anemia, purpura, petechiae. Allergic/Immunologic: no itchy/runny eyes, nasal congestion, recent allergic reactions, rashes  PHYSICAL EXAM: Filed Vitals:   02/25/15 1259  BP: 128/80  Pulse: 80   General: No acute distress, sitting on wheelchair Head:  Normocephalic/atraumatic  Eyes: Fundoscopic exam shows bilateral sharp discs, no vessel changes, exudates, or hemorrhages Neck: supple, no paraspinal tenderness, full range of motion Back: No paraspinal tenderness Heart: regular rate and rhythm Lungs: Clear to auscultation bilaterally. Vascular: No carotid bruits. Skin/Extremities: No rash, no edema Neurological Exam: Mental status: alert and oriented to person, place, and time, +mild dysarthria, no aphasia, Fund of knowledge is appropriate.  Recent and remote memory are intact.  Attention and concentration are normal.    Able to name objects and repeat phrases. Cranial nerves: CN I: not tested CN II: pupils equal, round and reactive to light, visual fields intact, fundi unremarkable. CN III, IV, VI:  full range of motion, no nystagmus, no ptosis CN V: facial sensation intact CN VII:  upper and lower face symmetric CN VIII: hearing intact to finger rub CN IX, X: gag intact, uvula midline CN XI: sternocleidomastoid and trapezius muscles intact CN XII: tongue midline Bulk & Tone: increased in both LE, no fasciculations. Motor: 5/5 throughout except for right foot dorsiflexion 3+/5. No pronator drift. Sensation: intact to light touch, cold, pin, vibration and joint position sense.  No extinction to double simultaneous stimulation.  Romberg test negative Deep Tendon Reflexes: +2 on both UE, brisk +3 both LE, no ankle clonus Plantar responses: downgoing bilaterally Cerebellar: no incoordination on finger to nose, heel to shin. No dysdiadochokinesia Gait: ambulates with walker with right foot drop Tremor: none  IMPRESSION: This is a 29 year old left-handed woman with a history of cerebral palsy, migraines, seizures since childhood, none since age 31, presenting for management of seizures during pregnancy. Records are conflicting as to who stopped medication, but she has been off low dose Depakote  daily since December 2015, with no seizures since age 37. The episode in February was a migraine per patient and stepmother's accounts. She was also off Depakote with her previous pregnancy. She is due for delivery in 2 weeks, we had discussed holding off on restarting AED at this point, continue to monitor clinically. Since she had been seizure-free for at least 10 years on very low dose Depakote, I am unsure about the need for long-term AEDs, we will do an EEG after her delivery and assess for focal abnormalities that increase risk for recurrent seizures. If abnormal, would restart medication. This may have been helping for migraine prophylaxis as well. She does not drive. She will follow-up in 2 months and knows to call our office for any changes.   Thank you for allowing me to participate in the care of this patient. Please do not hesitate to call for any questions or  concerns.   Dana Parks, M.D.  CC: Dr. Ane Payment, Dr. Concepcion Elk

## 2015-02-25 NOTE — Patient Instructions (Signed)
1. Schedule routine EEG next month 2. Follow-up in 6-8 weeks 3. Congratulations on your upcoming delivery!

## 2015-02-28 ENCOUNTER — Encounter: Payer: Self-pay | Admitting: Family Medicine

## 2015-02-28 ENCOUNTER — Ambulatory Visit (INDEPENDENT_AMBULATORY_CARE_PROVIDER_SITE_OTHER): Payer: Medicare Other | Admitting: Family Medicine

## 2015-02-28 ENCOUNTER — Encounter: Payer: Self-pay | Admitting: Neurology

## 2015-02-28 VITALS — BP 142/102 | HR 108 | Temp 98.5°F | Wt 157.4 lb

## 2015-02-28 DIAGNOSIS — O139 Gestational [pregnancy-induced] hypertension without significant proteinuria, unspecified trimester: Secondary | ICD-10-CM

## 2015-02-28 DIAGNOSIS — O169 Unspecified maternal hypertension, unspecified trimester: Secondary | ICD-10-CM

## 2015-02-28 DIAGNOSIS — R569 Unspecified convulsions: Secondary | ICD-10-CM | POA: Insufficient documentation

## 2015-02-28 DIAGNOSIS — O34219 Maternal care for unspecified type scar from previous cesarean delivery: Secondary | ICD-10-CM

## 2015-02-28 DIAGNOSIS — O3421 Maternal care for scar from previous cesarean delivery: Secondary | ICD-10-CM

## 2015-02-28 LAB — COMPREHENSIVE METABOLIC PANEL
ALT: 10 U/L (ref 6–29)
AST: 25 U/L (ref 10–30)
Albumin: 3.2 g/dL — ABNORMAL LOW (ref 3.6–5.1)
Alkaline Phosphatase: 315 U/L — ABNORMAL HIGH (ref 33–115)
BUN: 12 mg/dL (ref 7–25)
CALCIUM: 9.2 mg/dL (ref 8.6–10.2)
CHLORIDE: 100 mmol/L (ref 98–110)
CO2: 19 mmol/L — ABNORMAL LOW (ref 20–31)
Creat: 0.45 mg/dL — ABNORMAL LOW (ref 0.50–1.10)
Glucose, Bld: 58 mg/dL — ABNORMAL LOW (ref 65–99)
Potassium: 4.8 mmol/L (ref 3.5–5.3)
Sodium: 132 mmol/L — ABNORMAL LOW (ref 135–146)
TOTAL PROTEIN: 6.7 g/dL (ref 6.1–8.1)
Total Bilirubin: 0.5 mg/dL (ref 0.2–1.2)

## 2015-02-28 LAB — CBC
HCT: 32.4 % — ABNORMAL LOW (ref 36.0–46.0)
Hemoglobin: 10.2 g/dL — ABNORMAL LOW (ref 12.0–15.0)
MCH: 25 pg — ABNORMAL LOW (ref 26.0–34.0)
MCHC: 31.5 g/dL (ref 30.0–36.0)
MCV: 79.4 fL (ref 78.0–100.0)
Platelets: 185 10*3/uL (ref 150–400)
RBC: 4.08 MIL/uL (ref 3.87–5.11)
RDW: 16.6 % — ABNORMAL HIGH (ref 11.5–15.5)
WBC: 6.3 10*3/uL (ref 4.0–10.5)

## 2015-02-28 NOTE — Patient Instructions (Signed)
Preeclampsia and Eclampsia Preeclampsia is a serious condition that develops only during pregnancy. It is also called toxemia of pregnancy. This condition causes high blood pressure along with other symptoms, such as swelling and headaches. These may develop as the condition gets worse. Preeclampsia may occur 20 weeks or later into your pregnancy.  Diagnosing and treating preeclampsia early is very important. If not treated early, it can cause serious problems for you and your baby. One problem it can lead to is eclampsia, which is a condition that causes muscle jerking or shaking (convulsions) in the mother. Delivering your baby is the best treatment for preeclampsia or eclampsia.  RISK FACTORS The cause of preeclampsia is not known. You may be more likely to develop preeclampsia if you have certain risk factors. These include:   Being pregnant for the first time.  Having preeclampsia in a past pregnancy.  Having a family history of preeclampsia.  Having high blood pressure.  Being pregnant with twins or triplets.  Being 35 or older.  Being African American.  Having kidney disease or diabetes.  Having medical conditions such as lupus or blood diseases.  Being very overweight (obese). SIGNS AND SYMPTOMS  The earliest signs of preeclampsia are:  High blood pressure.  Increased protein in your urine. Your health care provider will check for this at every prenatal visit. Other symptoms that can develop include:   Severe headaches.  Sudden weight gain.  Swelling of your hands, face, legs, and feet.  Feeling sick to your stomach (nauseous) and throwing up (vomiting).  Vision problems (blurred or double vision).  Numbness in your face, arms, legs, and feet.  Dizziness.  Slurred speech.  Sensitivity to bright lights.  Abdominal pain. DIAGNOSIS  There are no screening tests for preeclampsia. Your health care provider will ask you about symptoms and check for signs of  preeclampsia during your prenatal visits. You may also have tests, including:  Urine testing.  Blood testing.  Checking your baby's heart rate.  Checking the health of your baby and your placenta using images created with sound waves (ultrasound). TREATMENT  You can work out the best treatment approach together with your health care provider. It is very important to keep all prenatal appointments. If you have an increased risk of preeclampsia, you may need more frequent prenatal exams.  Your health care provider may prescribe bed rest.  You may have to eat as little salt as possible.  You may need to take medicine to lower your blood pressure if the condition does not respond to more conservative measures.  You may need to stay in the hospital if your condition is severe. There, treatment will focus on controlling your blood pressure and fluid retention. You may also need to take medicine to prevent seizures.  If the condition gets worse, your baby may need to be delivered early to protect you and the baby. You may have your labor started with medicine (be induced), or you may have a cesarean delivery.  Preeclampsia usually goes away after the baby is born. HOME CARE INSTRUCTIONS   Only take over-the-counter or prescription medicines as directed by your health care provider.  Lie on your left side while resting. This keeps pressure off your baby.  Elevate your feet while resting.  Get regular exercise. Ask your health care provider what type of exercise is safe for you.  Avoid caffeine and alcohol.  Do not smoke.  Drink 6-8 glasses of water every day.  Eat a balanced diet   that is low in salt. Do not add salt to your food.  Avoid stressful situations as much as possible.  Get plenty of rest and sleep.  Keep all prenatal appointments and tests as scheduled. SEEK MEDICAL CARE IF:  You are gaining more weight than expected.  You have any headaches, abdominal pain, or  nausea.  You are bruising more than usual.  You feel dizzy or light-headed. SEEK IMMEDIATE MEDICAL CARE IF:   You develop sudden or severe swelling anywhere in your body. This usually happens in the legs.  You gain 5 lb (2.3 kg) or more in a week.  You have a severe headache, dizziness, problems with your vision, or confusion.  You have severe abdominal pain.  You have lasting nausea or vomiting.  You have a seizure.  You have trouble moving any part of your body.  You develop numbness in your body.  You have trouble speaking.  You have any abnormal bleeding.  You develop a stiff neck.  You pass out. MAKE SURE YOU:   Understand these instructions.  Will watch your condition.  Will get help right away if you are not doing well or get worse. Document Released: 07/20/2000 Document Revised: 07/28/2013 Document Reviewed: 05/15/2013 [employer] Patient Information 2015 [employer]. This information is not intended to replace advice given to you by your health care provider. Make sure you discuss any questions you have with your health care provider.  Breastfeeding Deciding to breastfeed is one of the best choices you can make for you and your baby. A change in hormones during pregnancy causes your breast tissue to grow and increases the number and size of your milk ducts. These hormones also allow proteins, sugars, and fats from your blood supply to make breast milk in your milk-producing glands. Hormones prevent breast milk from being released before your baby is born as well as prompt milk flow after birth. Once breastfeeding has begun, thoughts of your baby, as well as his or her sucking or crying, can stimulate the release of milk from your milk-producing glands.  BENEFITS OF BREASTFEEDING For Your Baby  Your first milk (colostrum) helps your baby's digestive system function better.   There are antibodies in your milk that help your baby fight off infections.   Your  baby has a lower incidence of asthma, allergies, and sudden infant death syndrome.   The nutrients in breast milk are better for your baby than infant formulas and are designed uniquely for your baby's needs.   Breast milk improves your baby's brain development.   Your baby is less likely to develop other conditions, such as childhood obesity, asthma, or type 2 diabetes mellitus.  For You   Breastfeeding helps to create a very special bond between you and your baby.   Breastfeeding is convenient. Breast milk is always available at the correct temperature and costs nothing.   Breastfeeding helps to burn calories and helps you lose the weight gained during pregnancy.   Breastfeeding makes your uterus contract to its prepregnancy size faster and slows bleeding (lochia) after you give birth.   Breastfeeding helps to lower your risk of developing type 2 diabetes mellitus, osteoporosis, and breast or ovarian cancer later in life. SIGNS THAT YOUR BABY IS HUNGRY Early Signs of Hunger  Increased alertness or activity.  Stretching.  Movement of the head from side to side.  Movement of the head and opening of the mouth when the corner of the mouth or cheek is stroked (rooting).    Increased sucking sounds, smacking lips, cooing, sighing, or squeaking.  Hand-to-mouth movements.  Increased sucking of fingers or hands. Late Signs of Hunger  Fussing.  Intermittent crying. Extreme Signs of Hunger Signs of extreme hunger will require calming and consoling before your baby will be able to breastfeed successfully. Do not wait for the following signs of extreme hunger to occur before you initiate breastfeeding:   Restlessness.  A loud, strong cry.   Screaming. BREASTFEEDING BASICS Breastfeeding Initiation  Find a comfortable place to sit or lie down, with your neck and back well supported.  Place a pillow or rolled up blanket under your baby to bring him or her to the level of  your breast (if you are seated). Nursing pillows are specially designed to help support your arms and your baby while you breastfeed.  Make sure that your baby's abdomen is facing your abdomen.   Gently massage your breast. With your fingertips, massage from your chest wall toward your nipple in a circular motion. This encourages milk flow. You may need to continue this action during the feeding if your milk flows slowly.  Support your breast with 4 fingers underneath and your thumb above your nipple. Make sure your fingers are well away from your nipple and your baby's mouth.   Stroke your baby's lips gently with your finger or nipple.   When your baby's mouth is open wide enough, quickly bring your baby to your breast, placing your entire nipple and as much of the colored area around your nipple (areola) as possible into your baby's mouth.   More areola should be visible above your baby's upper lip than below the lower lip.   Your baby's tongue should be between his or her lower gum and your breast.   Ensure that your baby's mouth is correctly positioned around your nipple (latched). Your baby's lips should create a seal on your breast and be turned out (everted).  It is common for your baby to suck about 2-3 minutes in order to start the flow of breast milk. Latching Teaching your baby how to latch on to your breast properly is very important. An improper latch can cause nipple pain and decreased milk supply for you and poor weight gain in your baby. Also, if your baby is not latched onto your nipple properly, he or she may swallow some air during feeding. This can make your baby fussy. Burping your baby when you switch breasts during the feeding can help to get rid of the air. However, teaching your baby to latch on properly is still the best way to prevent fussiness from swallowing air while breastfeeding. Signs that your baby has successfully latched on to your nipple:    Silent  tugging or silent sucking, without causing you pain.   Swallowing heard between every 3-4 sucks.    Muscle movement above and in front of his or her ears while sucking.  Signs that your baby has not successfully latched on to nipple:   Sucking sounds or smacking sounds from your baby while breastfeeding.  Nipple pain. If you think your baby has not latched on correctly, slip your finger into the corner of your baby's mouth to break the suction and place it between your baby's gums. Attempt breastfeeding initiation again. Signs of Successful Breastfeeding Signs from your baby:   A gradual decrease in the number of sucks or complete cessation of sucking.   Falling asleep.   Relaxation of his or her body.     Retention of a small amount of milk in his or her mouth.   Letting go of your breast by himself or herself. Signs from you:  Breasts that have increased in firmness, weight, and size 1-3 hours after feeding.   Breasts that are softer immediately after breastfeeding.  Increased milk volume, as well as a change in milk consistency and color by the fifth day of breastfeeding.   Nipples that are not sore, cracked, or bleeding. Signs That Your Baby is Getting Enough Milk  Wetting at least 3 diapers in a 24-hour period. The urine should be clear and pale yellow by age 5 days.  At least 3 stools in a 24-hour period by age 5 days. The stool should be soft and yellow.  At least 3 stools in a 24-hour period by age 7 days. The stool should be seedy and yellow.  No loss of weight greater than 10% of birth weight during the first 3 days of age.  Average weight gain of 4-7 ounces (113-198 g) per week after age 4 days.  Consistent daily weight gain by age 5 days, without weight loss after the age of 2 weeks. After a feeding, your baby may spit up a small amount. This is common. BREASTFEEDING FREQUENCY AND DURATION Frequent feeding will help you make more milk and can prevent  sore nipples and breast engorgement. Breastfeed when you feel the need to reduce the fullness of your breasts or when your baby shows signs of hunger. This is called "breastfeeding on demand." Avoid introducing a pacifier to your baby while you are working to establish breastfeeding (the first 4-6 weeks after your baby is born). After this time you may choose to use a pacifier. Research has shown that pacifier use during the first year of a baby's life decreases the risk of sudden infant death syndrome (SIDS). Allow your baby to feed on each breast as long as he or she wants. Breastfeed until your baby is finished feeding. When your baby unlatches or falls asleep while feeding from the first breast, offer the second breast. Because newborns are often sleepy in the first few weeks of life, you may need to awaken your baby to get him or her to feed. Breastfeeding times will vary from baby to baby. However, the following rules can serve as a guide to help you ensure that your baby is properly fed:  Newborns (babies 4 weeks of age or younger) may breastfeed every 1-3 hours.  Newborns should not go longer than 3 hours during the day or 5 hours during the night without breastfeeding.  You should breastfeed your baby a minimum of 8 times in a 24-hour period until you begin to introduce solid foods to your baby at around 6 months of age. BREAST MILK PUMPING Pumping and storing breast milk allows you to ensure that your baby is exclusively fed your breast milk, even at times when you are unable to breastfeed. This is especially important if you are going back to work while you are still breastfeeding or when you are not able to be present during feedings. Your lactation consultant can give you guidelines on how long it is safe to store breast milk.  A breast pump is a machine that allows you to pump milk from your breast into a sterile bottle. The pumped breast milk can then be stored in a refrigerator or freezer.  Some breast pumps are operated by hand, while others use electricity. Ask your lactation consultant which type   will work best for you. Breast pumps can be purchased, but some hospitals and breastfeeding support groups lease breast pumps on a monthly basis. A lactation consultant can teach you how to hand express breast milk, if you prefer not to use a pump.  CARING FOR YOUR BREASTS WHILE YOU BREASTFEED Nipples can become dry, cracked, and sore while breastfeeding. The following recommendations can help keep your breasts moisturized and healthy:  Avoid using soap on your nipples.   Wear a supportive bra. Although not required, special nursing bras and tank tops are designed to allow access to your breasts for breastfeeding without taking off your entire bra or top. Avoid wearing underwire-style bras or extremely tight bras.  Air dry your nipples for 3-4minutes after each feeding.   Use only cotton bra pads to absorb leaked breast milk. Leaking of breast milk between feedings is normal.   Use lanolin on your nipples after breastfeeding. Lanolin helps to maintain your skin's normal moisture barrier. If you use pure lanolin, you do not need to wash it off before feeding your baby again. Pure lanolin is not toxic to your baby. You may also hand express a few drops of breast milk and gently massage that milk into your nipples and allow the milk to air dry. In the first few weeks after giving birth, some women experience extremely full breasts (engorgement). Engorgement can make your breasts feel heavy, warm, and tender to the touch. Engorgement peaks within 3-5 days after you give birth. The following recommendations can help ease engorgement:  Completely empty your breasts while breastfeeding or pumping. You may want to start by applying warm, moist heat (in the shower or with warm water-soaked hand towels) just before feeding or pumping. This increases circulation and helps the milk flow. If your baby  does not completely empty your breasts while breastfeeding, pump any extra milk after he or she is finished.  Wear a snug bra (nursing or regular) or tank top for 1-2 days to signal your body to slightly decrease milk production.  Apply ice packs to your breasts, unless this is too uncomfortable for you.  Make sure that your baby is latched on and positioned properly while breastfeeding. If engorgement persists after 48 hours of following these recommendations, contact your health care provider or a lactation consultant. OVERALL HEALTH CARE RECOMMENDATIONS WHILE BREASTFEEDING  Eat healthy foods. Alternate between meals and snacks, eating 3 of each per day. Because what you eat affects your breast milk, some of the foods may make your baby more irritable than usual. Avoid eating these foods if you are sure that they are negatively affecting your baby.  Drink milk, fruit juice, and water to satisfy your thirst (about 10 glasses a day).   Rest often, relax, and continue to take your prenatal vitamins to prevent fatigue, stress, and anemia.  Continue breast self-awareness checks.  Avoid chewing and smoking tobacco.  Avoid alcohol and drug use. Some medicines that may be harmful to your baby can pass through breast milk. It is important to ask your health care provider before taking any medicine, including all over-the-counter and prescription medicine as well as vitamin and herbal supplements. It is possible to become pregnant while breastfeeding. If birth control is desired, ask your health care provider about options that will be safe for your baby. SEEK MEDICAL CARE IF:   You feel like you want to stop breastfeeding or have become frustrated with breastfeeding.  You have painful breasts or nipples.  Your   nipples are cracked or bleeding.  Your breasts are red, tender, or warm.  You have a swollen area on either breast.  You have a fever or chills.  You have nausea or  vomiting.  You have drainage other than breast milk from your nipples.  Your breasts do not become full before feedings by the fifth day after you give birth.  You feel sad and depressed.  Your baby is too sleepy to eat well.  Your baby is having trouble sleeping.   Your baby is wetting less than 3 diapers in a 24-hour period.  Your baby has less than 3 stools in a 24-hour period.  Your baby's skin or the white part of his or her eyes becomes yellow.   Your baby is not gaining weight by 5 days of age. SEEK IMMEDIATE MEDICAL CARE IF:   Your baby is overly tired (lethargic) and does not want to wake up and feed.  Your baby develops an unexplained fever. Document Released: 07/23/2005 Document Revised: 07/28/2013 Document Reviewed: 01/14/2013 [employer] Patient Information 2015 [employer]. This information is not intended to replace advice given to you by your health care provider. Make sure you discuss any questions you have with your health care provider.  

## 2015-03-01 ENCOUNTER — Telehealth: Payer: Self-pay | Admitting: General Practice

## 2015-03-01 ENCOUNTER — Other Ambulatory Visit: Payer: Medicare Other

## 2015-03-01 DIAGNOSIS — O99353 Diseases of the nervous system complicating pregnancy, third trimester: Principal | ICD-10-CM

## 2015-03-01 DIAGNOSIS — G40909 Epilepsy, unspecified, not intractable, without status epilepticus: Secondary | ICD-10-CM

## 2015-03-01 LAB — PROTEIN / CREATININE RATIO, URINE
Creatinine, Urine: 95.9 mg/dL
PROTEIN CREATININE RATIO: 0.35 — AB (ref <0.15)
Total Protein, Urine: 34 mg/dL — ABNORMAL HIGH (ref 5–24)

## 2015-03-01 NOTE — Telephone Encounter (Signed)
Per Dr Shawnie Pons, patient needs BP check and urine collection with labs tomorrow or Thursday 7/28. Patient also needs NST and growth u/s. Scheduled u/s for 7/27 @ 3pm. Called patient and a woman answered stating she was asleep at the moment but she would have her call us back later. Told her that's fine, if she doesn't we will just discuss everything at her appt tomorrow morning. The woman verbalized understanding and had no questions

## 2015-03-01 NOTE — Progress Notes (Signed)
Subjective:  Dana Parks is a 29 y.o. G2P1001 at [redacted]w[redacted]d being seen today for ongoing prenatal care.  Patient reports no complaints. Has not been seen in this office since 01/17/15. I cannot see that she has been scheduled for RCS as yet and she is almost 38 wks. She denies significant headache or vision changes.  Contractions: Not present.  Vag. Bleeding: None. Movement: Present. Denies leaking of fluid.   The following portions of the patient's history were reviewed and updated as appropriate: allergies, current medications, past family history, past medical history, past social history, past surgical history and problem list.   Objective:   Filed Vitals:   02/28/15 1143 02/28/15 1145  BP: 146/90 142/102  Pulse: 108   Temp: 98.5 F (36.9 C)   Weight: 157 lb 6.4 oz (71.396 kg)     Fetal Status: Fetal Heart Rate (bpm): 141   Movement: Present     General:  Alert, oriented and cooperative. Patient is in no acute distress.  Skin: Skin is warm and dry. No rash noted.   Cardiovascular: Normal heart rate noted  Respiratory: Normal respiratory effort, no problems with respiration noted  Abdomen: Soft, gravid, appropriate for gestational age. Pain/Pressure: Present     Vaginal: Vag. Bleeding: None.       Extremities: Normal range of motion.  Edema: Mild pitting, slight indentation  Mental Status: Normal mood and affect. Normal behavior. Normal judgment and thought content.   Urinalysis:     leaves no urine today  Assessment and Plan:  Pregnancy: G2P1001 at [redacted]w[redacted]d  1. Previous cesarean section complicating pregnancy, antepartum condition or complication Scheduled for 39 wks. - Culture, beta strep (group b only) - GC/Chlamydia Probe Amp  2. Elevated blood pressure affecting pregnancy, antepartum New onset today--will check labs today. Repeat BP in next few days--if remains elevated would need to consider earlier delivery based on GHTN. - CBC - Comprehensive metabolic panel - Protein  / creatinine ratio, urine  Term labor symptoms and general obstetric precautions including but not limited to vaginal bleeding, contractions, leaking of fluid and fetal movement were reviewed in detail with the patient. Please refer to After Visit Summary for other counseling recommendations.  Return in 1 week (on 03/07/2015).   Reva Bores, MD

## 2015-03-01 NOTE — Telephone Encounter (Signed)
Per Dr Shawnie Pons patient needs ultrasound appt tomorrow. Scheduled appt for tomorrow 7/27 @ 3pm. Called patient and she told me to speak to her mom about her appts. Informed patient's mother about ultrasound appt tomorrow @ 3pm and that there were no openings available in the morning. Patient's mother verbalized understanding and states Dana Parks cannot come to the morning appt here in our office anyway because that's too early for her and it's difficult for her to get moving early in the morning. Told her Dana Parks may come at 4pm then for her NST. Patient's mother verbalized understanding and had no questions. After phone call, I was informed patient did not need ultrasound due to if NST is non reactive or BP remains high, patient will need sooner c/s. Patient needs to come in the morning. Called patient's mother explaining that Dana Parks did not need an ultrasound and that it is preferable Dana Parks come in the morning for her appt in our office. Patient's mother verbalized understanding and states the only time she can come is tomorrow between [id number]. Told her we will see Dana Parks then. Patient's mother had no questions

## 2015-03-02 ENCOUNTER — Encounter (HOSPITAL_COMMUNITY): Payer: Self-pay

## 2015-03-02 ENCOUNTER — Ambulatory Visit (INDEPENDENT_AMBULATORY_CARE_PROVIDER_SITE_OTHER): Payer: Medicare Other | Admitting: *Deleted

## 2015-03-02 ENCOUNTER — Ambulatory Visit (HOSPITAL_COMMUNITY): Payer: Medicare Other

## 2015-03-02 ENCOUNTER — Encounter (HOSPITAL_COMMUNITY): Payer: Self-pay | Admitting: Obstetrics & Gynecology

## 2015-03-02 ENCOUNTER — Encounter (HOSPITAL_COMMUNITY)
Admission: RE | Admit: 2015-03-02 | Discharge: 2015-03-02 | Disposition: A | Discharge status: Home / Self Care | Payer: Medicare Other | Source: Ambulatory Visit | Attending: Obstetrics & Gynecology | Admitting: Obstetrics & Gynecology

## 2015-03-02 VITALS — BP 145/84 | HR 108

## 2015-03-02 VITALS — BP 146/94 | HR 100 | Ht 59.0 in | Wt 158.0 lb

## 2015-03-02 DIAGNOSIS — G40909 Epilepsy, unspecified, not intractable, without status epilepticus: Secondary | ICD-10-CM

## 2015-03-02 DIAGNOSIS — O99350 Diseases of the nervous system complicating pregnancy, unspecified trimester: Secondary | ICD-10-CM

## 2015-03-02 DIAGNOSIS — O139 Gestational [pregnancy-induced] hypertension without significant proteinuria, unspecified trimester: Secondary | ICD-10-CM

## 2015-03-02 DIAGNOSIS — O34219 Maternal care for unspecified type scar from previous cesarean delivery: Secondary | ICD-10-CM

## 2015-03-02 HISTORY — DX: Myoneural disorder, unspecified: G70.9

## 2015-03-02 LAB — GC/CHLAMYDIA PROBE AMP
CT Probe RNA: NEGATIVE
GC Probe RNA: NEGATIVE

## 2015-03-02 LAB — CBC
HCT: 32 % — ABNORMAL LOW (ref 36.0–46.0)
HEMOGLOBIN: 9.7 g/dL — AB (ref 12.0–15.0)
MCH: 25.1 pg — AB (ref 26.0–34.0)
MCHC: 30.3 g/dL (ref 30.0–36.0)
MCV: 82.7 fL (ref 78.0–100.0)
Platelets: 211 10*3/uL (ref 150–400)
RBC: 3.87 MIL/uL (ref 3.87–5.11)
RDW: 16.6 % — ABNORMAL HIGH (ref 11.5–15.5)
WBC: 5.5 10*3/uL (ref 4.0–10.5)

## 2015-03-02 LAB — TYPE AND SCREEN
ABO/RH(D): A POS
Antibody Screen: NEGATIVE

## 2015-03-02 LAB — ABO/RH: ABO/RH(D): A POS

## 2015-03-02 LAB — CULTURE, BETA STREP (GROUP B ONLY)

## 2015-03-02 NOTE — Progress Notes (Signed)
Pt accompanied by stepmother for appt today. Plan of care discussed - C/S tomorrow instead of 8/2 as originally planned due to elevated BP. Initially, pt's stepmother stated that Ellen would not be having the C/S tomorrow because it is too early, she does not have child care available for Chaley's daughter and that is not what the doctor planned. I explained that with the new finding of Deyona's elevated BP, the plan needs to be updated to decrease the risk of possible problems for Sharri or the baby. I assured that Shanekqua's chart has been reviewed by several of her doctors and they all agreed this is the decision for the best care of both Jai and the baby. The stepmother then said that the plan for C/S was made 1.5 months ago and they should have told her then. I again stated that the plan is only changing because of the new changes in Ezma's BP and that there was no way of knowing that this would happen. I further explained that in order to provide care with the best possible outcomes for mother and baby we constantly have to re-evaluate a patient's present condition and make decisions accordingly. Pt's stepmother stated that this would not have happened if Thomasa did what she was supposed to do. "She left the house and was eating all kinds of fast food and salt. This would have never happened.Marland KitchenMarland KitchenMarland KitchenCrystal knows that, she knows what she did!"  I stated that elevated BP in pregnancy is not an uncommon occurrence and it is doubtful that her diet has caused the change in her BP. She then stated, "Oh yes it did, I know it did."  I then stated that while we may never know the direct cause of her increased BP, the fact remains that we want to administer the care she and the baby need which is delivery of the baby tomorrow. Pt then expressed concern that her baby would be premature if born tomorrow. I explained that a baby is full term @ [redacted] wks gestation and she is now over [redacted] wks gestation, therefore her  baby is not premature. Pt and stepmother voiced understanding of all information discussed and agreed to change in plan of care for C/S w/BTS tomorrow. When NST was completed I walked pt, stepmother and daughter upstairs to register for pre-op appt. Pt and stepmother were talking about the birth of baby tomorrow and seemed excited.

## 2015-03-02 NOTE — Patient Instructions (Addendum)
Your procedure is scheduled on:03/03/15  Enter through the Main Entrance at :2:00 pm Pick up desk phone and dial [phone] and inform us of your arrival.  Please call [phone] if you have any problems the morning of surgery.  Remember: Do not eat food after midnight:tonight Clear liquids are ok until:1130 am on Thursday   You may brush your teeth the morning of surgery.   DO NOT wear jewelry, eye make-up, lipstick,body lotion, or dark fingernail polish.  (Polished toes are ok) You may wear deodorant.  If you are to be admitted after surgery, leave suitcase in car until your room has been assigned.  Wear loose fitting, comfortable clothes for your ride home.

## 2015-03-03 ENCOUNTER — Inpatient Hospital Stay (HOSPITAL_COMMUNITY)
Admission: RE | Admit: 2015-03-03 | Discharge: 2015-03-05 | DRG: 765 | Disposition: A | Discharge status: Home / Self Care | Payer: Medicare Other | Source: Ambulatory Visit | Attending: Obstetrics & Gynecology | Admitting: Obstetrics & Gynecology

## 2015-03-03 ENCOUNTER — Inpatient Hospital Stay (HOSPITAL_COMMUNITY): Payer: Medicare Other | Admitting: Anesthesiology

## 2015-03-03 ENCOUNTER — Encounter (HOSPITAL_COMMUNITY): Payer: Self-pay | Admitting: Anesthesiology

## 2015-03-03 ENCOUNTER — Other Ambulatory Visit: Payer: Medicare Other

## 2015-03-03 ENCOUNTER — Encounter (HOSPITAL_COMMUNITY)
Admission: RE | Disposition: A | Discharge status: Home / Self Care | Payer: Self-pay | Source: Ambulatory Visit | Attending: Obstetrics & Gynecology

## 2015-03-03 DIAGNOSIS — O9952 Diseases of the respiratory system complicating childbirth: Secondary | ICD-10-CM | POA: Diagnosis present

## 2015-03-03 DIAGNOSIS — Z8249 Family history of ischemic heart disease and other diseases of the circulatory system: Secondary | ICD-10-CM | POA: Diagnosis not present

## 2015-03-03 DIAGNOSIS — O1403 Mild to moderate pre-eclampsia, third trimester: Secondary | ICD-10-CM | POA: Diagnosis present

## 2015-03-03 DIAGNOSIS — G809 Cerebral palsy, unspecified: Secondary | ICD-10-CM | POA: Diagnosis present

## 2015-03-03 DIAGNOSIS — Z3A38 38 weeks gestation of pregnancy: Secondary | ICD-10-CM | POA: Diagnosis present

## 2015-03-03 DIAGNOSIS — O34219 Maternal care for unspecified type scar from previous cesarean delivery: Secondary | ICD-10-CM

## 2015-03-03 DIAGNOSIS — G40909 Epilepsy, unspecified, not intractable, without status epilepticus: Secondary | ICD-10-CM

## 2015-03-03 DIAGNOSIS — Z833 Family history of diabetes mellitus: Secondary | ICD-10-CM | POA: Diagnosis not present

## 2015-03-03 DIAGNOSIS — O9942 Diseases of the circulatory system complicating childbirth: Secondary | ICD-10-CM | POA: Diagnosis present

## 2015-03-03 DIAGNOSIS — Z9889 Other specified postprocedural states: Secondary | ICD-10-CM

## 2015-03-03 DIAGNOSIS — O3421 Maternal care for scar from previous cesarean delivery: Secondary | ICD-10-CM | POA: Diagnosis present

## 2015-03-03 DIAGNOSIS — Z302 Encounter for sterilization: Secondary | ICD-10-CM | POA: Diagnosis not present

## 2015-03-03 DIAGNOSIS — O99354 Diseases of the nervous system complicating childbirth: Secondary | ICD-10-CM | POA: Diagnosis present

## 2015-03-03 DIAGNOSIS — Z87891 Personal history of nicotine dependence: Secondary | ICD-10-CM

## 2015-03-03 DIAGNOSIS — O1493 Unspecified pre-eclampsia, third trimester: Secondary | ICD-10-CM | POA: Diagnosis not present

## 2015-03-03 DIAGNOSIS — M62838 Other muscle spasm: Secondary | ICD-10-CM | POA: Diagnosis present

## 2015-03-03 DIAGNOSIS — J4520 Mild intermittent asthma, uncomplicated: Secondary | ICD-10-CM | POA: Diagnosis present

## 2015-03-03 DIAGNOSIS — O99350 Diseases of the nervous system complicating pregnancy, unspecified trimester: Secondary | ICD-10-CM

## 2015-03-03 LAB — CBC
HCT: 29.2 % — ABNORMAL LOW (ref 36.0–46.0)
HEMOGLOBIN: 8.9 g/dL — AB (ref 12.0–15.0)
MCH: 25 pg — ABNORMAL LOW (ref 26.0–34.0)
MCHC: 30.5 g/dL (ref 30.0–36.0)
MCV: 82 fL (ref 78.0–100.0)
PLATELETS: 192 10*3/uL (ref 150–400)
RBC: 3.56 MIL/uL — AB (ref 3.87–5.11)
RDW: 16.6 % — ABNORMAL HIGH (ref 11.5–15.5)
WBC: 9.2 10*3/uL (ref 4.0–10.5)

## 2015-03-03 LAB — RPR: RPR Ser Ql: NONREACTIVE

## 2015-03-03 SURGERY — Surgical Case
Anesthesia: Spinal | Site: Abdomen | Laterality: Bilateral

## 2015-03-03 MED ORDER — SODIUM CHLORIDE 0.9 % IJ SOLN: 3.0000 mL | INTRAMUSCULAR | Status: DC | PRN

## 2015-03-03 MED ORDER — DEXTROSE 5 % IV SOLN: 1.0000 ug/kg/h | INTRAVENOUS | Status: DC | PRN

## 2015-03-03 MED ORDER — CEFAZOLIN SODIUM-DEXTROSE 2-3 GM-% IV SOLR
INTRAVENOUS | Status: AC
  Filled 2015-03-03: qty 50

## 2015-03-03 MED ORDER — ONDANSETRON HCL 4 MG/2ML IJ SOLN
INTRAMUSCULAR | Status: DC | PRN
  Administered 2015-03-03: 4 mg via INTRAVENOUS

## 2015-03-03 MED ORDER — DIPHENHYDRAMINE HCL 25 MG PO CAPS: 25.0000 mg | ORAL_CAPSULE | ORAL | Status: DC | PRN

## 2015-03-03 MED ORDER — SCOPOLAMINE 1 MG/3DAYS TD PT72: 1.0000 | MEDICATED_PATCH | Freq: Once | TRANSDERMAL | Status: DC

## 2015-03-03 MED ORDER — EPHEDRINE 5 MG/ML INJ
INTRAVENOUS | Status: AC
  Filled 2015-03-03: qty 20

## 2015-03-03 MED ORDER — TETANUS-DIPHTH-ACELL PERTUSSIS 5-2.5-18.5 LF-MCG/0.5 IM SUSP: 0.5000 mL | Freq: Once | INTRAMUSCULAR | Status: DC

## 2015-03-03 MED ORDER — BUPIVACAINE HCL (PF) 0.5 % IJ SOLN
INTRAMUSCULAR | Status: DC | PRN
  Administered 2015-03-03: 30 mL

## 2015-03-03 MED ORDER — KETOROLAC TROMETHAMINE 30 MG/ML IJ SOLN: 30.0000 mg | Freq: Four times a day (QID) | INTRAMUSCULAR | Status: DC | PRN

## 2015-03-03 MED ORDER — 0.9 % SODIUM CHLORIDE (POUR BTL) OPTIME
TOPICAL | Status: DC | PRN
  Administered 2015-03-03: 1000 mL

## 2015-03-03 MED ORDER — BUPIVACAINE HCL (PF) 0.5 % IJ SOLN
INTRAMUSCULAR | Status: AC
  Filled 2015-03-03: qty 30

## 2015-03-03 MED ORDER — LACTATED RINGERS IV SOLN: INTRAVENOUS | Status: DC

## 2015-03-03 MED ORDER — MEPERIDINE HCL 25 MG/ML IJ SOLN: 6.2500 mg | INTRAMUSCULAR | Status: DC | PRN

## 2015-03-03 MED ORDER — OXYTOCIN 10 UNIT/ML IJ SOLN
40.0000 [IU] | INTRAVENOUS | Status: DC | PRN
  Administered 2015-03-03: 40 [IU] via INTRAVENOUS

## 2015-03-03 MED ORDER — MORPHINE SULFATE (PF) 0.5 MG/ML IJ SOLN
INTRAMUSCULAR | Status: DC | PRN
  Administered 2015-03-03: .2 mg via INTRATHECAL

## 2015-03-03 MED ORDER — ZOLPIDEM TARTRATE 5 MG PO TABS: 5.0000 mg | ORAL_TABLET | Freq: Every evening | ORAL | Status: DC | PRN

## 2015-03-03 MED ORDER — OXYTOCIN 10 UNIT/ML IJ SOLN
INTRAMUSCULAR | Status: AC
  Filled 2015-03-03: qty 4

## 2015-03-03 MED ORDER — PRENATAL MULTIVITAMIN CH
1.0000 | ORAL_TABLET | Freq: Every day | ORAL | Status: DC
  Administered 2015-03-04 – 2015-03-05 (×2): 1 via ORAL
  Filled 2015-03-03 (×2): qty 1

## 2015-03-03 MED ORDER — PROMETHAZINE HCL 25 MG/ML IJ SOLN: 6.2500 mg | INTRAMUSCULAR | Status: DC | PRN

## 2015-03-03 MED ORDER — HYDROMORPHONE HCL 2 MG PO TABS
2.0000 mg | ORAL_TABLET | ORAL | Status: DC | PRN
  Filled 2015-03-03: qty 1

## 2015-03-03 MED ORDER — CEFAZOLIN SODIUM-DEXTROSE 2-3 GM-% IV SOLR
INTRAVENOUS | Status: DC | PRN
  Administered 2015-03-03: 2 g via INTRAVENOUS

## 2015-03-03 MED ORDER — FENTANYL CITRATE (PF) 100 MCG/2ML IJ SOLN
INTRAMUSCULAR | Status: AC
  Filled 2015-03-03: qty 2

## 2015-03-03 MED ORDER — MENTHOL 3 MG MT LOZG: 1.0000 | LOZENGE | OROMUCOSAL | Status: DC | PRN

## 2015-03-03 MED ORDER — IBUPROFEN 600 MG PO TABS
600.0000 mg | ORAL_TABLET | Freq: Four times a day (QID) | ORAL | Status: DC
  Administered 2015-03-03 – 2015-03-05 (×7): 600 mg via ORAL
  Filled 2015-03-03 (×7): qty 1

## 2015-03-03 MED ORDER — DIBUCAINE 1 % RE OINT: 1.0000 "application " | TOPICAL_OINTMENT | RECTAL | Status: DC | PRN

## 2015-03-03 MED ORDER — LACTATED RINGERS IV SOLN
INTRAVENOUS | Status: DC | PRN
  Administered 2015-03-03 (×3): via INTRAVENOUS

## 2015-03-03 MED ORDER — PHENYLEPHRINE 8 MG IN D5W 100 ML (0.08MG/ML) PREMIX OPTIME
INJECTION | INTRAVENOUS | Status: DC | PRN
  Administered 2015-03-03: 60 ug/min via INTRAVENOUS

## 2015-03-03 MED ORDER — LACTATED RINGERS IV SOLN
INTRAVENOUS | Status: DC
  Administered 2015-03-03: 15:00:00 via INTRAVENOUS

## 2015-03-03 MED ORDER — LANOLIN HYDROUS EX OINT: 1.0000 "application " | TOPICAL_OINTMENT | CUTANEOUS | Status: DC | PRN

## 2015-03-03 MED ORDER — ONDANSETRON HCL 4 MG/2ML IJ SOLN
INTRAMUSCULAR | Status: AC
  Filled 2015-03-03: qty 2

## 2015-03-03 MED ORDER — EPHEDRINE SULFATE 50 MG/ML IJ SOLN
INTRAMUSCULAR | Status: DC | PRN
  Administered 2015-03-03 (×2): 5 mg via INTRAVENOUS
  Administered 2015-03-03: 10 mg via INTRAVENOUS

## 2015-03-03 MED ORDER — MORPHINE SULFATE 0.5 MG/ML IJ SOLN
INTRAMUSCULAR | Status: AC
  Filled 2015-03-03: qty 10

## 2015-03-03 MED ORDER — IBUPROFEN 600 MG PO TABS: 600.0000 mg | ORAL_TABLET | Freq: Four times a day (QID) | ORAL | Status: DC | PRN

## 2015-03-03 MED ORDER — BUPIVACAINE IN DEXTROSE 0.75-8.25 % IT SOLN
INTRATHECAL | Status: DC | PRN
  Administered 2015-03-03: 9 mg via INTRATHECAL

## 2015-03-03 MED ORDER — DEXTROSE 5 % IV SOLN: 3.0000 g | INTRAVENOUS | Status: DC

## 2015-03-03 MED ORDER — METOCLOPRAMIDE HCL 5 MG/ML IJ SOLN
INTRAMUSCULAR | Status: AC
  Filled 2015-03-03: qty 2

## 2015-03-03 MED ORDER — NALOXONE HCL 0.4 MG/ML IJ SOLN: 0.4000 mg | INTRAMUSCULAR | Status: DC | PRN

## 2015-03-03 MED ORDER — DIPHENHYDRAMINE HCL 25 MG PO CAPS: 25.0000 mg | ORAL_CAPSULE | Freq: Four times a day (QID) | ORAL | Status: DC | PRN

## 2015-03-03 MED ORDER — FENTANYL CITRATE (PF) 100 MCG/2ML IJ SOLN: 25.0000 ug | INTRAMUSCULAR | Status: DC | PRN

## 2015-03-03 MED ORDER — ONDANSETRON HCL 4 MG/2ML IJ SOLN
4.0000 mg | Freq: Three times a day (TID) | INTRAMUSCULAR | Status: DC | PRN
  Administered 2015-03-03: 4 mg via INTRAVENOUS
  Filled 2015-03-03: qty 2

## 2015-03-03 MED ORDER — FENTANYL CITRATE (PF) 100 MCG/2ML IJ SOLN
INTRAMUSCULAR | Status: DC | PRN
  Administered 2015-03-03: 12.5 ug via INTRATHECAL

## 2015-03-03 MED ORDER — MEASLES, MUMPS & RUBELLA VAC ~~LOC~~ INJ: 0.5000 mL | INJECTION | Freq: Once | SUBCUTANEOUS | Status: DC

## 2015-03-03 MED ORDER — PHENYLEPHRINE 8 MG IN D5W 100 ML (0.08MG/ML) PREMIX OPTIME
INJECTION | INTRAVENOUS | Status: AC
  Filled 2015-03-03: qty 100

## 2015-03-03 MED ORDER — MIDAZOLAM HCL 2 MG/2ML IJ SOLN: 0.5000 mg | Freq: Once | INTRAMUSCULAR | Status: DC | PRN

## 2015-03-03 MED ORDER — OXYTOCIN 40 UNITS IN LACTATED RINGERS INFUSION - SIMPLE MED: 62.5000 mL/h | INTRAVENOUS | Status: AC

## 2015-03-03 MED ORDER — SENNOSIDES-DOCUSATE SODIUM 8.6-50 MG PO TABS
2.0000 | ORAL_TABLET | ORAL | Status: DC
Start: 2015-03-04 — End: 2015-03-05
  Administered 2015-03-03 – 2015-03-04 (×2): 2 via ORAL
  Filled 2015-03-03 (×2): qty 2

## 2015-03-03 MED ORDER — ACETAMINOPHEN 500 MG PO TABS
1000.0000 mg | ORAL_TABLET | Freq: Four times a day (QID) | ORAL | Status: AC
  Administered 2015-03-03 – 2015-03-04 (×3): 1000 mg via ORAL
  Filled 2015-03-03 (×3): qty 2

## 2015-03-03 MED ORDER — SIMETHICONE 80 MG PO CHEW
80.0000 mg | CHEWABLE_TABLET | ORAL | Status: DC
  Administered 2015-03-03 – 2015-03-04 (×2): 80 mg via ORAL
  Filled 2015-03-03 (×2): qty 1

## 2015-03-03 MED ORDER — ACETAMINOPHEN 325 MG PO TABS: 650.0000 mg | ORAL_TABLET | ORAL | Status: DC | PRN

## 2015-03-03 MED ORDER — WITCH HAZEL-GLYCERIN EX PADS: 1.0000 "application " | MEDICATED_PAD | CUTANEOUS | Status: DC | PRN

## 2015-03-03 MED ORDER — SIMETHICONE 80 MG PO CHEW: 80.0000 mg | CHEWABLE_TABLET | ORAL | Status: DC | PRN

## 2015-03-03 MED ORDER — METOCLOPRAMIDE HCL 5 MG/ML IJ SOLN
INTRAMUSCULAR | Status: DC | PRN
  Administered 2015-03-03: 10 mg via INTRAVENOUS

## 2015-03-03 MED ORDER — DIPHENHYDRAMINE HCL 50 MG/ML IJ SOLN: 12.5000 mg | INTRAMUSCULAR | Status: DC | PRN

## 2015-03-03 MED ORDER — SIMETHICONE 80 MG PO CHEW
80.0000 mg | CHEWABLE_TABLET | Freq: Three times a day (TID) | ORAL | Status: DC
  Administered 2015-03-04 – 2015-03-05 (×4): 80 mg via ORAL
  Filled 2015-03-03 (×4): qty 1

## 2015-03-03 SURGICAL SUPPLY — 36 items
BINDER ABD UNIV 10 28-50 (GAUZE/BANDAGES/DRESSINGS) IMPLANT
BINDER ABD UNIV 12 45-62 (WOUND CARE) IMPLANT
BINDER ABDOM UNIV 10 (GAUZE/BANDAGES/DRESSINGS)
BINDER ABDOMINAL 46IN 62IN (WOUND CARE)
CLAMP CORD UMBIL (MISCELLANEOUS) IMPLANT
CLIP FILSHIE TUBAL LIGA STRL (Clip) ×2 IMPLANT
CLOTH BEACON ORANGE TIMEOUT ST (SAFETY) ×3 IMPLANT
DRAPE SHEET LG 3/4 BI-LAMINATE (DRAPES) IMPLANT
DRSG OPSITE POSTOP 4X10 (GAUZE/BANDAGES/DRESSINGS) ×3 IMPLANT
DURAPREP 26ML APPLICATOR (WOUND CARE) ×3 IMPLANT
ELECT REM PT RETURN 9FT ADLT (ELECTROSURGICAL) ×3
ELECTRODE REM PT RTRN 9FT ADLT (ELECTROSURGICAL) ×1 IMPLANT
EXTRACTOR VACUUM KIWI (MISCELLANEOUS) IMPLANT
GLOVE BIO SURGEON STRL SZ7 (GLOVE) ×3 IMPLANT
GLOVE BIOGEL PI IND STRL 7.0 (GLOVE) ×1 IMPLANT
GLOVE BIOGEL PI INDICATOR 7.0 (GLOVE) ×2
GOWN STRL REUS W/TWL LRG LVL3 (GOWN DISPOSABLE) ×6 IMPLANT
KIT ABG SYR 3ML LUER SLIP (SYRINGE) IMPLANT
NDL HYPO 25X5/8 SAFETYGLIDE (NEEDLE) IMPLANT
NEEDLE HYPO 22GX1.5 SAFETY (NEEDLE) ×3 IMPLANT
NEEDLE HYPO 25X5/8 SAFETYGLIDE (NEEDLE) IMPLANT
NS IRRIG 1000ML POUR BTL (IV SOLUTION) ×3 IMPLANT
PACK C SECTION WH (CUSTOM PROCEDURE TRAY) ×3 IMPLANT
PAD OB MATERNITY 4.3X12.25 (PERSONAL CARE ITEMS) ×3 IMPLANT
RTRCTR C-SECT PINK 25CM LRG (MISCELLANEOUS) IMPLANT
SPONGE SURGIFOAM ABS GEL 12-7 (HEMOSTASIS) IMPLANT
SUT PDS AB 0 CTX 60 (SUTURE) IMPLANT
SUT PLAIN 0 NONE (SUTURE) IMPLANT
SUT SILK 0 TIES 10X30 (SUTURE) IMPLANT
SUT VIC AB 0 CT1 36 (SUTURE) ×9 IMPLANT
SUT VIC AB 3-0 CT1 27 (SUTURE) ×3
SUT VIC AB 3-0 CT1 TAPERPNT 27 (SUTURE) ×1 IMPLANT
SUT VIC AB 4-0 KS 27 (SUTURE) IMPLANT
SYR CONTROL 10ML LL (SYRINGE) ×3 IMPLANT
TOWEL OR 17X24 6PK STRL BLUE (TOWEL DISPOSABLE) ×3 IMPLANT
TRAY FOLEY CATH SILVER 14FR (SET/KITS/TRAYS/PACK) ×3 IMPLANT

## 2015-03-03 NOTE — Anesthesia Postprocedure Evaluation (Signed)
Anesthesia Post Note  Patient: Dana Parks  Procedure(s) Performed: Procedure(s) (LRB): CESAREAN SECTION WITH BILATERAL TUBAL LIGATION (Bilateral)  Anesthesia type: Spinal  Patient location: PACU  Post pain: Pain level controlled  Post assessment: Post-op Vital signs reviewed  Last Vitals:  Filed Vitals:   03/03/15 1645  BP:   Pulse: 103  Temp:   Resp: 20    Post vital signs: Reviewed  Level of consciousness: awake  Complications: No apparent anesthesia complications

## 2015-03-03 NOTE — Anesthesia Procedure Notes (Signed)
Spinal Patient location during procedure: OR Start time: 03/03/2015 2:54 PM End time: 03/03/2015 2:58 PM Staffing Anesthesiologist: Leilani Able Performed by: anesthesiologist  Preanesthetic Checklist Completed: patient identified, surgical consent, pre-op evaluation, timeout performed, IV checked, risks and benefits discussed and monitors and equipment checked Spinal Block Patient position: sitting Prep: site prepped and draped and DuraPrep Patient monitoring: heart rate, cardiac monitor, continuous pulse ox and blood pressure Approach: midline Location: L3-4 Injection technique: single-shot Needle Needle type: Pencan  Needle gauge: 24 G Needle length: 9 cm Needle insertion depth: 5 cm Assessment Sensory level: T4

## 2015-03-03 NOTE — Progress Notes (Signed)
Abdominal dressing saturated at 1750,  Heart rate 100 -120.  Dr Erin Fulling notified and ordered to reinforce abdominal dressing with a pressure dressing.  Pressure dressing applied.  1800 Pressure dressing remains dry and intact though heart rate remains 100 -120. Pre op H/H 9.7/32.0 noted.  Dr Arby Barrette notified... Ordered stat CBC and to transfer pt to ICU this evening.  CBC drawn stat, Dr Erin Fulling notified, ordered to notify Dr Shawnie Pons to assess the pt.  Dr Shawnie Pons notified and in to evaluate pt.  Ordered pt to be transferred to mother/baby and will await CBC results.  Pt comfortable, moving legs now, fundus firm U/U with minimal bleeding, abdominal dressing dry and intact.  Dr Arby Barrette notified.

## 2015-03-03 NOTE — Brief Op Note (Signed)
03/03/2015  4:53 PM  PATIENT:  Dana Parks  29 y.o. female  PRE-OPERATIVE DIAGNOSIS:  cpt [phone] - REPEAT C/section and preeclampsia.  Undesired fertility  POST-OPERATIVE DIAGNOSIS:  cpt [phone] - REPEAT C/section and preeclampsia.  Undesired fertility  PROCEDURE:  Procedure(s) with comments: CESAREAN SECTION WITH BILATERAL TUBAL LIGATION (Bilateral) - Requested 03/03/15 @ 3:30p  Ok per Cleo/Myra-TM  SURGEON:  Surgeon(s) and Role:    * Willodean Rosenthal, MD - Primary  ASSISTANTS: Marjean Donna, M.D.   ANESTHESIA:   spinal  EBL:  Total I/O In: 2900 [I.V.:2900] Out: 800 [Urine:100; Blood:700]  BLOOD ADMINISTERED:none  DRAINS: none   LOCAL MEDICATIONS USED:  MARCAINE     SPECIMEN:  Source of Specimen:  placenta  DISPOSITION OF SPECIMEN:  PATHOLOGY  COUNTS:  YES  TOURNIQUET:  * No tourniquets in log *  DICTATION: .Note written in EPIC  PLAN OF CARE: Admit to inpatient   PATIENT DISPOSITION:  PACU - hemodynamically stable.   Delay start of Pharmacological VTE agent (>24hrs) due to surgical blood loss or risk of bleeding: yes  Complications: none

## 2015-03-03 NOTE — H&P (Signed)
Obstetric Preoperative History and Physical  Dana Parks is a 29 y.o. G2P1001 with IUP at [redacted]w[redacted]d presenting for presenting for scheduled repeat cesarean section with BTL.  No acute concerns.   Prenatal Course Source of Care: limited [facility] with onset of care at 13 weeks Pregnancy complications or risks: Patient Active Problem List   Diagnosis Date Noted  . Convulsion 02/28/2015  . Asthma, mild intermittent, well-controlled 12/13/2014  . Low lying placenta without hemorrhage, antepartum   . Previous cesarean section complicating pregnancy, antepartum condition or complication 09/09/2014  . Epilepsy affecting pregnancy, antepartum 09/09/2014  . Asthma complicating pregnancy, antepartum 09/09/2014  . Cerebral palsy   . Seizure disorder    She undecided She desires bilateral tubal ligation for postpartum contraception.   Prenatal labs and studies: ABO, Rh: --/--/A POS, A POS (07/27 1220) Antibody: NEG (07/27 1220) Rubella: 4.46 (02/04 1237) RPR: Non Reactive (07/27 1220)  HBsAg: NEGATIVE (02/04 1237)  HIV: NONREACTIVE (05/09 1321)  GBS:  1 hr Glucola  normal Genetic screening normal Anatomy US normal  Prenatal Transfer Tool  Maternal Diabetes: No Genetic Screening: Normal Maternal Ultrasounds/Referrals: Normal Fetal Ultrasounds or other Referrals:  None Maternal Substance Abuse:  No Significant Maternal Medications:  No current facility-administered medications on file prior to encounter.   Current Outpatient Prescriptions on File Prior to Encounter  Medication Sig Dispense Refill  . acetaminophen (TYLENOL) 325 MG tablet Take 650 mg by mouth every 6 (six) hours as needed.    Marland Kitchen albuterol (PROVENTIL HFA;VENTOLIN HFA) 108 (90 BASE) MCG/ACT inhaler Inhale 2 puffs into the lungs every 6 (six) hours as needed for wheezing (wheezing). 1 Inhaler 5  . Pediatric Multiple Vitamins (FLINTSTONES MULTIVITAMIN PO) Take 2 tablets by mouth.      Significant Maternal Lab Results: Lab  values include: Other: elevated Pr:Cr ratio    Past Medical History  Diagnosis Date  . Cerebral palsy   . Seizure disorder   . Asthma   . Low sodium diet   . Headache(784.0)     frequently  . Seizures     last one in high school;takes Depakote daily  . Numbness     bilateral feet  . Muscle spasm     takes Flexeril daily  . Peripheral edema     occasionally  . Back pain     unknown   . Anxiety   . Liver disease   . Neuromuscular disorder     cerebral palsy    Past Surgical History  Procedure Laterality Date  . Leg surgery Bilateral   . Cesarean section  2007  . Tooth extraction N/A 02/16/2013    Procedure: EXTRACTION 16, 17, 32;  Surgeon: Georgia Lopes, DDS;  Location: [facility] OR;  Service: Oral Surgery;  Laterality: N/A;    OB History  Gravida Para Term Preterm AB SAB TAB Ectopic Multiple Living  0 0 0 0 0 0 1    # Outcome Date GA Lbr Len/2nd Weight Sex Delivery Anes PTL Lv  2 Current           1 Term 12/19/05 [redacted]w[redacted]d  7 lb 11 oz (3.487 kg) F CS-Unspec Spinal  Y     Complications: Cephalopelvic Disproportion,Hypertension affecting pregnancy, antepartum, third trimester      History   Social History  . Marital Status: Single    Spouse Name: N/A  . Number of Children: 1  . Years of Education: high schoo   Occupational History  . Disabled  Social History Main Topics  . Smoking status: Former Smoker -- 0.25 packs/day for 7 years    Types: Cigarettes    Quit date: 11/05/2014  . Smokeless tobacco: Never Used  . Alcohol Use: No  . Drug Use: No  . Sexual Activity:    Partners: Male    Birth Control/ Protection: None   Other Topics Concern  . None   Social History Narrative   Lives at home with her sister.   Single.    Has 2 children. Currently pregnant with 1.    Family History  Problem Relation Age of Onset  . Diabetes Maternal Grandfather   . Hypertension Maternal Grandfather     Prescriptions prior to admission  Medication Sig Dispense  Refill Last Dose  . acetaminophen (TYLENOL) 325 MG tablet Take 650 mg by mouth every 6 (six) hours as needed.   Taking  . albuterol (PROVENTIL HFA;VENTOLIN HFA) 108 (90 BASE) MCG/ACT inhaler Inhale 2 puffs into the lungs every 6 (six) hours as needed for wheezing (wheezing). 1 Inhaler 5 rescue  . Pediatric Multiple Vitamins (FLINTSTONES MULTIVITAMIN PO) Take 2 tablets by mouth.   Taking    Allergies  Allergen Reactions  . Shrimp [Shellfish Allergy] Anaphylaxis    Can eat other shellfish  . Codeine Other (See Comments)    GI upset  . Dust Mite Extract Other (See Comments)    Unknown reaction  . Mold Extract [Trichophyton] Other (See Comments)    Unknown reaction  . Penicillins Other (See Comments)    Unknown reaction  . Soap Other (See Comments)    Unknown reaction    Review of Systems: Negative except for what is mentioned in HPI.  Physical Exam: Pulse 114  Resp 16  SpO2 99%  LMP 06/25/2014 FHR by Doppler: present CONSTITUTIONAL: Well-developed, well-nourished female in no acute distress.  HENT:  Normocephalic, atraumatic, External right and left ear normal. Oropharynx is clear and moist EYES: Conjunctivae and EOM are normal. Pupils are equal, round, and reactive to light. No scleral icterus.  NECK: Normal range of motion, supple, no masses SKIN: Skin is warm and dry. No rash noted. Not diaphoretic. No erythema. No pallor. NEUROLGIC: Alert and oriented to person, place, and time. Normal reflexes, muscle tone coordination. No cranial nerve deficit noted. PSYCHIATRIC: Normal mood and affect. Normal behavior. Normal judgment and thought content. CARDIOVASCULAR: Normal heart rate noted, regular rhythm RESPIRATORY: Effort and breath sounds normal, no problems with respiration noted ABDOMEN: Soft, nontender, nondistended, gravid. Well-healed Pfannenstiel incision. PELVIC: Deferred MUSCULOSKELETAL: pt with CP. Ambulates with walker.  Pertinent Labs/Studies:   Results for orders  placed or performed during the hospital encounter of 03/03/15 (from the past 72 hour(s))  ABO/Rh     Status: None   Collection Time: 03/02/15 12:20 PM  Result Value Ref Range   ABO/RH(D) A POS     Assessment and Plan :Dana Parks is a 29 y.o. G2P1001 at [redacted]w[redacted]d being admitted being admitted for scheduled cesarean section. The risks of cesarean section discussed with the patient included but were not limited to: bleeding which may require transfusion or reoperation; infection which may require antibiotics; injury to bowel, bladder, ureters or other surrounding organs; injury to the fetus; need for additional procedures including hysterectomy in the event of a life-threatening hemorrhage; placental abnormalities wth subsequent pregnancies, incisional problems, thromboembolic phenomenon and other postoperative/anesthesia complications. The patient concurred with the proposed plan, giving informed written consent for the procedure. Patient has been NPO since last night  she will remain NPO for procedure. Anesthesia and OR aware. Preoperative prophylactic antibiotics and SCDs ordered on call to the OR. To OR when ready.    Maanav Kassabian L. Erin Fulling, MD, FACOG  Attending Obstetrician & Gynecologist  Faculty Practice, [facility]

## 2015-03-03 NOTE — Anesthesia Preprocedure Evaluation (Signed)
Anesthesia Evaluation  Patient identified by MRN, date of birth, ID band Patient awake    Reviewed: Allergy & Precautions, H&P , NPO status , Patient's Chart, lab work & pertinent test results  Airway Mallampati: II  TM Distance: >3 FB Neck ROM: full    Dental no notable dental hx.    Pulmonary former smoker,    Pulmonary exam normal       Cardiovascular negative cardio ROS Normal cardiovascular exam    Neuro/Psych    GI/Hepatic negative GI ROS, Neg liver ROS,   Endo/Other  negative endocrine ROS  Renal/GU negative Renal ROS     Musculoskeletal   Abdominal (+) + obese,   Peds  Hematology negative hematology ROS (+)   Anesthesia Other Findings   Reproductive/Obstetrics (+) Pregnancy                             Anesthesia Physical Anesthesia Plan  ASA: III  Anesthesia Plan: Spinal   Post-op Pain Management:    Induction:   Airway Management Planned:   Additional Equipment:   Intra-op Plan:   Post-operative Plan:   Informed Consent: I have reviewed the patients History and Physical, chart, labs and discussed the procedure including the risks, benefits and alternatives for the proposed anesthesia with the patient or authorized representative who has indicated his/her understanding and acceptance.     Plan Discussed with: CRNA and Surgeon  Anesthesia Plan Comments:         Anesthesia Quick Evaluation

## 2015-03-03 NOTE — Op Note (Signed)
03/03/2015  4:53 PM  PATIENT:  Dana Parks  29 y.o. female  PRE-OPERATIVE DIAGNOSIS:  cpt [phone] - REPEAT C/section and preeclampsia.  Undesired fertility  POST-OPERATIVE DIAGNOSIS:  cpt 62130 - REPEAT C/section and preeclampsia.  Undesired fertility  PROCEDURE:  Procedure(s) with comments: CESAREAN SECTION WITH BILATERAL TUBAL LIGATION (Bilateral) - Requested 03/03/15 @ 3:30p  Ok per Cleo/Myra-TM  SURGEON:  Surgeon(s) and Role:    * Willodean Rosenthal, MD - Primary  ASSISTANTS: Marjean Donna, M.D.   ANESTHESIA:   spinal  EBL:  Total I/O In: 2900 [I.V.:2900] Out: 800 [Urine:100; Blood:700]  BLOOD ADMINISTERED:none  DRAINS: none   LOCAL MEDICATIONS USED:  MARCAINE     SPECIMEN:  Source of Specimen:  placenta  DISPOSITION OF SPECIMEN:  PATHOLOGY  COUNTS:  YES  TOURNIQUET:  * No tourniquets in log *  DICTATION: .Note written in EPIC  PLAN OF CARE: Admit to inpatient   PATIENT DISPOSITION:  PACU - hemodynamically stable.   Delay start of Pharmacological VTE agent (>24hrs) due to surgical blood loss or risk of bleeding: yes  Complications: none   INDICATIONS: Dana Parks is a 29 y.o. G2P2002 at [redacted]w[redacted]d here for cesarean section with bilateral tubal ligation secondary to the indications listed under preoperative diagnosis; please see preoperative note for further details.  The risks of cesarean section were discussed with the patient including but were not limited to: bleeding which may require transfusion or reoperation; infection which may require antibiotics; injury to bowel, bladder, ureters or other surrounding organs; injury to the fetus; need for additional procedures including hysterectomy in the event of a life-threatening hemorrhage; placental abnormalities wth subsequent pregnancies, incisional problems, thromboembolic phenomenon and other postoperative/anesthesia complications.   The patient concurred with the proposed plan, giving informed written  consent for the procedure.    FINDINGS:  Viable female infant in cephalic presentation.  Apgars pending.  Clear amniotic fluid.  Intact placenta, three vessel cord.  Normal uterus, fallopian tubes and ovaries bilaterally.  PROCEDURE IN DETAIL:  The patient preoperatively received intravenous antibiotics and had sequential compression devices applied to her lower extremities.  She was then taken to the operating room where spinal anesthesia was administered and was found to be adequate. She was then placed in a dorsal supine position with a leftward tilt, and prepped and draped in a sterile manner.  A foley catheter was placed into her bladder and attached to constant gravity.  After an adequate timeout was performed, a Pfannenstiel skin incision was made with scalpel and carried through to the underlying layer of fascia. The fascia was incised in the midline, and this incision was extended bilaterally using the Mayo scissors.  Kocher clamps were applied to the superior aspect of the fascial incision and the underlying rectus muscles were dissected off bluntly. A similar process was carried out on the inferior aspect of the fascial incision. The rectus muscles were separated in the midline bluntly and the peritoneum was entered bluntly.  Attention was turned to the lower uterine segment where a low transverse hysterotomy incision was made with a scalpel and extended bilaterally bluntly.  The infant was successfully delivered, the cord was clamped and cut and the infant was handed over to awaiting neonatology team. The placenta was delivered manually. The uterus was exteriorized. Uterine massage was then administered.  The placenta was intact with a three-vessel cord. The uterus was then cleared of clot and debris.  The hysterotomy was closed with 0 Vicryl in a running locked  fashion, and an imbricating layer was also placed with the same suture. The fallopian tubes were identified bilaterally and a Filshie clip  was placed 3 cm from the cornua on both sides to complete a sterilization. The uterus was returned to the pelvis. The pelvis was cleared of all clot and debris. Hemostasis was confirmed on all surfaces.  The peritoneum and the muscles were reapproximated using 0 Vicryl with 1 interrupted suture. The fascia was then closed using 0 Vicryl.  The subcutaneous layer was irrigated, then reapproximated with 3-0 vicryl in a running locked fashion, and the skin was closed with a 4-0 Vicryl subcuticular stitch.  30 cc of 0.5% marcaine was injected into the incision and benzoin and steristrips were applied.  The patient tolerated the procedure well. Sponge, lap, instrument and needle counts were correct x 2.  She was taken to the recovery room in stable condition.   Eisa Necaise L. Harraway-Smith, M.D., [facility]

## 2015-03-03 NOTE — Transfer of Care (Signed)
Immediate Anesthesia Transfer of Care Note  Patient: Dana Parks  Procedure(s) Performed: Procedure(s) with comments: CESAREAN SECTION WITH BILATERAL TUBAL LIGATION (Bilateral) - Requested 03/03/15 @ 3:30p  Ok per Cleo/Myra-TM  Patient Location: PACU  Anesthesia Type:Spinal  Level of Consciousness: awake  Airway & Oxygen Therapy: Patient Spontanous Breathing  Post-op Assessment: Report given to RN  Post vital signs: Reviewed and stable  Last Vitals:  Filed Vitals:   03/03/15 1344  Pulse: 114  Resp: 16    Complications: No apparent anesthesia complications

## 2015-03-04 ENCOUNTER — Encounter (HOSPITAL_COMMUNITY): Payer: Self-pay | Admitting: Obstetrics & Gynecology

## 2015-03-04 LAB — CBC
HCT: 27.4 % — ABNORMAL LOW (ref 36.0–46.0)
HEMOGLOBIN: 8.3 g/dL — AB (ref 12.0–15.0)
MCH: 24.8 pg — ABNORMAL LOW (ref 26.0–34.0)
MCHC: 30.3 g/dL (ref 30.0–36.0)
MCV: 81.8 fL (ref 78.0–100.0)
Platelets: 190 10*3/uL (ref 150–400)
RBC: 3.35 MIL/uL — AB (ref 3.87–5.11)
RDW: 16.8 % — AB (ref 11.5–15.5)
WBC: 6.3 10*3/uL (ref 4.0–10.5)

## 2015-03-04 NOTE — Addendum Note (Signed)
Addendum  created 03/04/15 0744 by Suella Grove, CRNA   Modules edited: Notes Section   Notes Section:  File: [id number]

## 2015-03-04 NOTE — Progress Notes (Signed)
Postpartum/Post-operative day 1 Subjective: Dana Parks is a 29 y.o. [id number] [redacted]w[redacted]d s/p rLTCS.  No acute events overnight.  Pt denies problems with ambulating, voiding or po intake.  She denies nausea or vomiting.  Pain is well controlled.  She has had flatus. She has not had bowel movement.  Lochia Minimal.  Plan for birth control is bilateral tubal ligation.  Method of Feeding:formula  Objective: Blood pressure 131/96, pulse 111, temperature 98.4 F (36.9 C), temperature source Oral, resp. rate 18, last menstrual period 06/25/2014, SpO2 98 %, unknown if currently breastfeeding.  Physical Exam:  General: alert, cooperative and no distress Lochia:normal flow Chest: CTAB Heart: RRR no m/r/g Abdomen: +BS, soft, nontender,  Uterine Fundus: firm DVT Evaluation: No evidence of DVT seen on physical exam. Extremities: no edema   Recent Labs  03/03/15 1845 03/04/15 0540  HGB 8.9* 8.3*  HCT 29.2* 27.4*    Assessment/Plan:  ASSESSMENT: Dana Parks is a 29 y.o. [id number] [redacted]w[redacted]d s/p rLTCS with BTL  Plan for discharge tomorrow (POD#2 vs POD#3)   LOS: 1 day   Federico Flake 03/04/2015, 6:56 PM

## 2015-03-04 NOTE — Anesthesia Postprocedure Evaluation (Signed)
  Anesthesia Post-op Note  Patient: Dana Parks  Procedure(s) Performed: Procedure(s) with comments: CESAREAN SECTION WITH BILATERAL TUBAL LIGATION (Bilateral) - Requested 03/03/15 @ 3:30p  Ok per Cleo/Myra-TM  Patient Location: Mother/Baby  Anesthesia Type:Spinal  Level of Consciousness: awake, alert , oriented and patient cooperative  Airway and Oxygen Therapy: Patient Spontanous Breathing  Post-op Pain: none  Post-op Assessment: Post-op Vital signs reviewed, Patient's Cardiovascular Status Stable, Respiratory Function Stable, Patent Airway, No signs of Nausea or vomiting, Adequate PO intake, Pain level controlled, No headache, No backache, Spinal receding and Patient able to bend at knees LLE Motor Response: Purposeful movement LLE Sensation: Numbness RLE Motor Response: Purposeful movement RLE Sensation: Numbness      Post-op Vital Signs: Reviewed and stable  Last Vitals:  Filed Vitals:   03/04/15 0345  BP: 138/83  Pulse: 97  Temp: 36.9 C  Resp: 18    Complications: No apparent anesthesia complications

## 2015-03-04 NOTE — Progress Notes (Signed)
   Post Partum Day #1  Subjective:  Dana Parks is a 29 y.o. G2P2002 [redacted]w[redacted]d s/p pLTCS.  No acute events overnight.  Pt denies problems with ambulating, voiding or po intake.  She reports occasional nausea, but no vomiting since last evening.  Pain is well controlled.  She has not had flatus. She has not had bowel movement.  Lochia Minimal.  Plan for birth control is bilateral tubal ligation.  Method of Feeding: Bottle  Objective: BP 138/83 mmHg  Pulse 97  Temp(Src) 98.5 F (36.9 C) (Oral)  Resp 18  SpO2 97%  LMP 06/25/2014  Breastfeeding? Unknown  Physical Exam:  General: alert, cooperative and no distress Lochia:normal flow Chest: CTAB Heart: RRR no m/r/g Abdomen: +BS, soft, nontender, fundus firm at/below umbilicus Incision site: clean, dry, and intact DVT Evaluation: No evidence of DVT seen on physical exam. Bilateral leg compression stockings.  Extremities: No edema   Recent Labs  03/03/15 1845 03/04/15 0540  HGB 8.9* 8.3*  HCT 29.2* 27.4*    Assessment/Plan:  ASSESSMENT: Dana Parks is a 29 y.o. G2P2002 [redacted]w[redacted]d ppd #1 s/p rLTCS/BTL who is doing well.   Dispo:  D/c POD #3, remove staples prior to d/c Can d/c POD#2 if patient desires (can have [facility] nurse remove staples) Contraception: BTL Keep monitoring vaginal bleeding CBC stable   LOS: 1 day   Lorane Gell 03/04/2015, 7:59 AM   OB fellow attestation Post Partum Day 1 I have seen and examined this patient and agree with above documentation in the medical student's note.   Dana Parks is a 29 y.o. [id number] s/p rLTCS with BTL.  Pt denies problems with ambulating, voiding or po intake. Pain is well controlled.  Plan for birth control is bilateral tubal ligation.  Method of Feeding: formula  PE:  BP 130/89 mmHg  Pulse 100  Temp(Src) 97.8 F (36.6 C) (Oral)  Resp 18  SpO2 98%  LMP 06/25/2014  Breastfeeding? Unknown Fundus firm  Plan for discharge: POD#3  Federico Flake,  MD 7:37 AM

## 2015-03-04 NOTE — Progress Notes (Signed)
UR chart review completed.  

## 2015-03-05 DIAGNOSIS — O3421 Maternal care for scar from previous cesarean delivery: Secondary | ICD-10-CM | POA: Diagnosis not present

## 2015-03-05 MED ORDER — FERROUS SULFATE 325 (65 FE) MG PO TABS: 325.0000 mg | ORAL_TABLET | Freq: Two times a day (BID) | ORAL | Status: DC

## 2015-03-05 MED ORDER — PNEUMOCOCCAL VAC POLYVALENT 25 MCG/0.5ML IJ INJ
0.5000 mL | INJECTION | Freq: Once | INTRAMUSCULAR | Status: AC
  Administered 2015-03-05: 0.5 mL via INTRAMUSCULAR
  Filled 2015-03-05: qty 0.5

## 2015-03-05 MED ORDER — IBUPROFEN 600 MG PO TABS: 600.0000 mg | ORAL_TABLET | Freq: Four times a day (QID) | ORAL | Status: DC | PRN

## 2015-03-05 MED ORDER — OXYCODONE-ACETAMINOPHEN 5-325 MG PO TABS: 1.0000 | ORAL_TABLET | Freq: Four times a day (QID) | ORAL | Status: DC | PRN

## 2015-03-05 NOTE — Clinical Social Work Maternal (Signed)
  CLINICAL SOCIAL WORK MATERNAL/CHILD NOTE  Patient Details  Name: Dana Parks MRN: [id number] Date of Birth: 06-20-1986  Date:  03/05/2015  Clinical Social Worker Initiating Note:  Norlene Duel, LCSW Date/ Time Initiated:  03/05/15/1030     Child's Name:  Dana Parks   Legal Guardian:  Mother Dana Parks)   Need for Interpreter:  None   Date of Referral:  03/04/15     Reason for Referral:  Other (Comment)   Referral Source:  [facility]   Address:  [place], [place] [address]  Phone number:   [phone])   Household Members:  Relatives, Minor Children   Natural Supports (not living in the home):  Immediate Family   Professional Supports: Home Care Staff (Active with [facility])   Employment: Disabled   Type of Work:     Education:  Database administrator Resources:  Medicaid, [employer] , SSI/Disability   Other Resources:  [employer], Physicist, medical     Cultural/Religious Considerations Which May Impact Care:  none noted  Strengths:  Home prepared for child , Compliance with medical plan , Pediatrician chosen    Risk Factors/Current Problems:      Cognitive State:  Alert , Able to Concentrate    Mood/Affect:  Calm , Happy , Relaxed    CSW Assessment:  Acknowledged order for social work consult to assess mother's hx of anxiety.   [name] also has hx of CP, seizure d/o and neuromuscular disease.  Met with mother and her step mother.  Both very pleasant and receptive to CSW.  Step mother has been with mother since hospitalization and has been caring for newborn.  Informed that [name] lives with her and she will be assuming the majority of care for newborn because of mother's disability.  Stepmother states that she has taken care for [name] since she was 11 years only and she is also caring for [name]'s 62 year old child.  FOB is not involved.   Step mother is retired and states that [name] and her children reside with her and she assumed the role of  caring for them full time.    [name] denies any current symptoms of depression or anxiety.   She also denies hx of illicit drug use.  No acute social concerns noted or reported at this time.   She reports having an excellent support system and everything needed for newborn.   Mother informed of social work Fish farm manager.  CSW Plan/Description:     Provided information and resources on PP Depression No further intervention required No barriers to discharge   Greysyn Vanderberg J, LCSW 03/05/2015, 4:14 PM

## 2015-03-05 NOTE — Discharge Instructions (Signed)
Cesarean Delivery, Care After °Refer to this sheet in the next few weeks. These instructions provide you with information on caring for yourself after your procedure. Your health care provider may also give you specific instructions. Your treatment has been planned according to current medical practices, but problems sometimes occur. Call your health care provider if you have any problems or questions after you go home. °HOME CARE INSTRUCTIONS  °· Only take over-the-counter or prescription medications as directed by your health care provider. °· Do not drink alcohol, especially if you are breastfeeding or taking medication to relieve pain. °· Do not chew or smoke tobacco. °· Continue to use good perineal care. Good perineal care includes: °¨ Wiping your perineum from front to back. °¨ Keeping your perineum clean. °· Check your surgical cut (incision) daily for increased redness, drainage, swelling, or separation of skin. °· Clean your incision gently with soap and water every day, and then pat it dry. If your health care provider says it is okay, leave the incision uncovered. Use a bandage (dressing) if the incision is draining fluid or appears irritated. If the adhesive strips across the incision do not fall off within 7 days, carefully peel them off. °· Hug a pillow when coughing or sneezing until your incision is healed. This helps to relieve pain. °· Do not use tampons or douche until your health care provider says it is okay. °· Shower, wash your hair, and take tub baths as directed by your health care provider. °· Wear a well-fitting bra that provides breast support. °· Limit wearing support panties or control-top hose. °· Drink enough fluids to keep your urine clear or pale yellow. °· Eat high-fiber foods such as whole grain cereals and breads, brown rice, beans, and fresh fruits and vegetables every day. These foods may help prevent or relieve constipation. °· Resume activities such as climbing stairs,  driving, lifting, exercising, or traveling as directed by your health care provider. °· Talk to your health care provider about resuming sexual activities. This is dependent upon your risk of infection, your rate of healing, and your comfort and desire to resume sexual activity. °· Try to have someone help you with your household activities and your newborn for at least a few days after you leave the hospital. °· Rest as much as possible. Try to rest or take a nap when your newborn is sleeping. °· Increase your activities gradually. °· Keep all of your scheduled postpartum appointments. It is very important to keep your scheduled follow-up appointments. At these appointments, your health care provider will be checking to make sure that you are healing physically and emotionally. °SEEK MEDICAL CARE IF:  °· You are passing large clots from your vagina. Save any clots to show your health care provider. °· You have a foul smelling discharge from your vagina. °· You have trouble urinating. °· You are urinating frequently. °· You have pain when you urinate. °· You have a change in your bowel movements. °· You have increasing redness, pain, or swelling near your incision. °· You have pus draining from your incision. °· Your incision is separating. °· You have painful, hard, or reddened breasts. °· You have a severe headache. °· You have blurred vision or see spots. °· You feel sad or depressed. °· You have thoughts of hurting yourself or your newborn. °· You have questions about your care, the care of your newborn, or medications. °· You are dizzy or light-headed. °· You have a rash. °· You   have pain, redness, or swelling at the site of the removed intravenous access (IV) tube.  You have nausea or vomiting.  You stopped breastfeeding and have not had a menstrual period within 12 weeks of stopping.  You are not breastfeeding and have not had a menstrual period within 12 weeks of delivery.  You have a fever. SEEK  IMMEDIATE MEDICAL CARE IF:  You have persistent pain.  You have chest pain.  You have shortness of breath.  You faint.  You have leg pain.  You have stomach pain.  Your vaginal bleeding saturates 2 or more sanitary pads in 1 hour. MAKE SURE YOU:   Understand these instructions.  Will watch your condition.  Will get help right away if you are not doing well or get worse. Document Released: 04/14/2002 Document Revised: 12/07/2013 Document Reviewed: 03/19/2012 [facility] Patient Information 2015 [place], [place]. This information is not intended to replace advice given to you by your health care provider. Make sure you discuss any questions you have with your health care provider.   Pregnancy and Anemia Anemia is a condition in which the concentration of red blood cells or hemoglobin in the blood is below normal. Hemoglobin is a substance in red blood cells that carries oxygen to the tissues of the body. Anemia results in not enough oxygen reaching these tissues.  Anemia during pregnancy is common because the fetus uses more iron and folic acid as it is developing. Your body may not produce enough red blood cells because of this. Also, during pregnancy, the liquid part of the blood (plasma) increases by about 50%, and the red blood cells increase by only 25%. This lowers the concentration of the red blood cells and creates a natural anemia-like situation.  CAUSES  The most common cause of anemia during pregnancy is not having enough iron in the body to make red blood cells (iron deficiency anemia). Other causes may include:  Folic acid deficiency.  Vitamin B12 deficiency.  Certain prescription or over-the-counter medicines.  Certain medical conditions or infections that destroy red blood cells.  A low platelet count and bleeding caused by antibodies that go through the placenta to the fetus from the mother's blood. SIGNS AND SYMPTOMS  Mild anemia may not be noticeable. If it  becomes severe, symptoms may include:  Tiredness.  Shortness of breath, especially with exercise.  Weakness.  Fainting.  Pale looking skin.  Headaches.  Feeling a fast or irregular heartbeat (palpitations). DIAGNOSIS  The type of anemia is usually diagnosed from your family and medical history and blood tests. TREATMENT  Treatment of anemia during pregnancy depends on the cause of the anemia. Treatment can include:  Supplements of iron, vitamin B12, or folic acid.  A blood transfusion. This may be needed if blood loss is severe.  Hospitalization. This may be needed if there is significant continual blood loss.  Dietary changes. HOME CARE INSTRUCTIONS   Follow your dietitian's or health care provider's dietary recommendations.  Increase your vitamin C intake. This will help the stomach absorb more iron.  Eat a diet rich in iron. This would include foods such as:  Liver.  Beef.  Whole grain bread.  Eggs.  Dried fruit.  Take iron and vitamins as directed by your health care provider.  Eat green leafy vegetables. These are a good source of folic acid. SEEK MEDICAL CARE IF:   You have frequent or lasting headaches.  You are looking pale.  You are bruising easily. SEEK IMMEDIATE MEDICAL CARE  IF:   You have extreme weakness, shortness of breath, or chest pain.  You become dizzy or have trouble concentrating.  You have heavy vaginal bleeding.  You develop a rash.  You have bloody or black, tarry stools.  You faint.  You vomit up blood.  You vomit repeatedly.  You have abdominal pain.  You have a fever or persistent symptoms for more than 2-3 days.  You have a fever and your symptoms suddenly get worse.  You are dehydrated. MAKE SURE YOU:   Understand these instructions.  Will watch your condition.  Will get help right away if you are not doing well or get worse. Document Released: 07/20/2000 Document Revised: 05/13/2013 Document Reviewed:  03/04/2013 [facility] Patient Information 2015 [place], [place]. This information is not intended to replace advice given to you by your health care provider. Make sure you discuss any questions you have with your health care provider.

## 2015-03-05 NOTE — Discharge Summary (Signed)
Obstetric Discharge Summary Reason for Admission: cesarean section Prenatal Procedures: none Intrapartum Procedures: cesarean: low cervical, transverse and tubal ligation Postpartum Procedures: none Complications-Operative and Postpartum: none  Hospital Course:  Active Problems:   Post-operative state   Dana Parks is a 29 y.o. W2N5621 s/p rLTCS/BTL.  Patient was admitted 7/28.  She has postpartum course that was uncomplicated. The pt feels ready to go home and  will be discharged with outpatient follow-up.   Today: No acute events overnight.  Pt denies problems with ambulating, voiding or po intake.  She denies nausea or vomiting.  Pain is well controlled.  She has had flatus. She has had bowel movement.  Lochia Small.  Plan for birth control is  bilateral tubal ligation.  Method of Feeding: bottle.  Physical Exam:  General: alert, cooperative and appears stated age 80: appropriate Uterine Fundus: firm Incision: healing well, no significant drainage, no dehiscence, no significant erythema DVT Evaluation: No evidence of DVT seen on physical exam.  H/H: Lab Results  Component Value Date/Time   HGB 8.3* 03/04/2015 05:40 AM   HCT 27.4* 03/04/2015 05:40 AM    Discharge Diagnoses: Term Pregnancy-delivered  Discharge Information: Date: 03/05/2015 Activity: pelvic rest Diet: routine  Medications: PNV, Iron and Percocet Breast feeding:  No: bottle Condition: stable Instructions: refer to handout Discharge to: home       Discharge Instructions    Discharge patient    Complete by:  As directed             Medication List    TAKE these medications        acetaminophen 325 MG tablet  Commonly known as:  TYLENOL  Take 650 mg by mouth every 6 (six) hours as needed.     albuterol 108 (90 BASE) MCG/ACT inhaler  Commonly known as:  PROVENTIL HFA;VENTOLIN HFA  Inhale 2 puffs into the lungs every 6 (six) hours as needed for wheezing (wheezing).     ferrous  sulfate 325 (65 FE) MG tablet  Take 1 tablet (325 mg total) by mouth 2 (two) times daily with a meal.     oxyCODONE-acetaminophen 5-325 MG per tablet  Commonly known as:  ROXICET  Take 1 tablet by mouth every 6 (six) hours as needed for moderate pain or severe pain.      ASK your doctor about these medications        FLINTSTONES MULTIVITAMIN PO  Take 2 tablets by mouth.         Lowanda Foster ,MD 03/05/2015,9:18 AM   I spoke with and examined patient and agree with resident/PA/SNM's note and plan of care.  Admitted at 38.2 for RLTCS d/t pre-e. Her postpartum course has been uncomplicated, bp's have been stable w/o medications, Denies ha, scotomata, ruq/epigastric pain, n/v.  Eating, drinking, voiding, ambulating well.  +flatus.  Lochia and pain wnl.  Denies dizziness, lightheadedness, or sob. No complaints. Hgb 9.7->8.3, asymptomatic, will send home on fe bid. She is bottlefeeding S/P BTL Incision w/o drainage, honeycomb dressing on F/U at clinic in 4-6wks for pp visit   Cheral Marker, CNM, [facility] 03/05/2015 10:32 AM

## 2015-03-07 ENCOUNTER — Ambulatory Visit: Payer: Medicare Other | Admitting: Neurology

## 2015-03-08 ENCOUNTER — Ambulatory Visit: Payer: Medicare Other | Admitting: Neurology

## 2015-03-14 ENCOUNTER — Other Ambulatory Visit: Payer: Self-pay | Admitting: Family Medicine

## 2015-03-14 MED ORDER — ENALAPRIL MALEATE 20 MG PO TABS: 20.0000 mg | ORAL_TABLET | Freq: Every day | ORAL | Status: DC

## 2015-03-14 NOTE — Progress Notes (Signed)
Received call from [facility] nurse - had elevated SBP in the 160s.  Start enalapril.  F/u BP in 1 week.

## 2015-03-30 ENCOUNTER — Other Ambulatory Visit: Payer: Medicare Other

## 2015-04-01 ENCOUNTER — Encounter: Payer: Self-pay | Admitting: Obstetrics & Gynecology

## 2015-04-01 ENCOUNTER — Ambulatory Visit (INDEPENDENT_AMBULATORY_CARE_PROVIDER_SITE_OTHER): Payer: Medicare Other | Admitting: Obstetrics & Gynecology

## 2015-04-01 VITALS — BP 148/104 | HR 82 | Temp 98.4°F | Wt 132.7 lb

## 2015-04-01 DIAGNOSIS — O1003 Pre-existing essential hypertension complicating the puerperium: Secondary | ICD-10-CM

## 2015-04-01 MED ORDER — HYDROCHLOROTHIAZIDE 25 MG PO TABS: 25.0000 mg | ORAL_TABLET | Freq: Every day | ORAL | Status: DC

## 2015-04-01 NOTE — Progress Notes (Signed)
Patient ID: Dana Parks, female   DOB: 04/14/1986, 29 y.o.   MRN: [id number] Subjective:     Dana Parks is a 29 y.o. female who presents for a postpartum visit. She is 4 weeks postpartum following a low cervical transverse Cesarean section. I have fully reviewed the prenatal and intrapartum course. The delivery was at [redacted] weeks gestational weeks. Outcome: repeat cesarean section, low transverse incision. Anesthesia: spinal. Postpartum course has been uncomplicated. Baby's course has been improving.  Gaining weight.Pecola Leisure is feeding by bottle. Bleeding no bleeding. Bowel function is normal. Bladder function is normal. Patient is not sexually active. Contraception method is tubal ligation. Postpartum depression screening: negative.  The following portions of the patient's history were reviewed and updated as appropriate: allergies, current medications, past family history, past medical history, past social history, past surgical history and problem list.  Review of Systems A comprehensive review of systems was negative.  NO headaches or visual changes or RUQ pain.  Objective:    BP 162/143 mmHg  Pulse 82  Temp(Src) 98.4 F (36.9 C)  Wt 132 lb 11.2 oz (60.192 kg)  Breastfeeding? No       Abd: soft, NT, ND.  Incision; clean, dry and intact.  Dressing removed today Assessment:     4 weeks postpartum exam. Pap smear not done at today's visit.   BP still elevated despite meds.  Will add HCTZ     Plan:    1. Contraception: tubal ligation 2. Continue Enalapril   3.  Add HCTZ  daily  . Follow up in: 1 month or as needed with primary car physician.

## 2015-04-01 NOTE — Patient Instructions (Signed)

## 2015-04-17 ENCOUNTER — Encounter (HOSPITAL_COMMUNITY): Payer: Self-pay

## 2015-04-17 ENCOUNTER — Emergency Department (HOSPITAL_COMMUNITY)
Admission: EM | Admit: 2015-04-17 | Discharge: 2015-04-17 | Disposition: A | Discharge status: Home / Self Care | Payer: Medicare Other | Attending: Emergency Medicine | Admitting: Emergency Medicine

## 2015-04-17 ENCOUNTER — Emergency Department (HOSPITAL_COMMUNITY): Payer: Medicare Other

## 2015-04-17 DIAGNOSIS — Z880 Allergy status to penicillin: Secondary | ICD-10-CM | POA: Insufficient documentation

## 2015-04-17 DIAGNOSIS — R0602 Shortness of breath: Secondary | ICD-10-CM | POA: Diagnosis present

## 2015-04-17 DIAGNOSIS — Z8669 Personal history of other diseases of the nervous system and sense organs: Secondary | ICD-10-CM | POA: Insufficient documentation

## 2015-04-17 DIAGNOSIS — Z8719 Personal history of other diseases of the digestive system: Secondary | ICD-10-CM | POA: Insufficient documentation

## 2015-04-17 DIAGNOSIS — J45901 Unspecified asthma with (acute) exacerbation: Secondary | ICD-10-CM | POA: Diagnosis not present

## 2015-04-17 DIAGNOSIS — Z87891 Personal history of nicotine dependence: Secondary | ICD-10-CM | POA: Diagnosis not present

## 2015-04-17 DIAGNOSIS — H539 Unspecified visual disturbance: Secondary | ICD-10-CM | POA: Diagnosis not present

## 2015-04-17 DIAGNOSIS — F419 Anxiety disorder, unspecified: Secondary | ICD-10-CM

## 2015-04-17 DIAGNOSIS — Z79899 Other long term (current) drug therapy: Secondary | ICD-10-CM | POA: Diagnosis not present

## 2015-04-17 MED ORDER — LORAZEPAM 1 MG PO TABS
1.0000 mg | ORAL_TABLET | Freq: Once | ORAL | Status: AC
  Administered 2015-04-17: 1 mg via ORAL
  Filled 2015-04-17: qty 1

## 2015-04-17 NOTE — ED Notes (Signed)
Per EMS, Pt, from a hotel, c/o SOB and chest tightness starting this morning.  Pain score 10/10.  Hx of asthma.  Pt has not taken anything for symptoms.

## 2015-04-17 NOTE — ED Notes (Signed)
Pt walked to bathroom by self.  Steady gait, no distress.

## 2015-04-17 NOTE — ED Notes (Signed)
Upon assessment, Pt having an anxiety attack.  This RN directed the Pt to slow her breathing and take deep breaths.  Pt reported feeling much better w/ slowed breathing.  However, she would quickly start hyperventilating again.

## 2015-04-17 NOTE — ED Provider Notes (Signed)
CSN: [id number]     Arrival date & time 04/17/15  0849 History   First MD Initiated Contact with Patient 04/17/15 1157     Chief Complaint  Patient presents with  . Shortness of Breath  . Anxiety     HPI   Dana Parks is a 29 y.o. female with a PMH of cerebral palsy, seizures, asthma, anxiety who presents to the ED with anxiety. Reports she was about to get on the bus this morning when she began to have an anxiety attack. She reports several anxiety attacks since that time, which caused her to come to the ED. She denies any precipitating factors. She has not tried anything for symptom relief. She reports associated chest tightness, palpitations, shortness of breath, dizziness, double vision. She denies loss of consciousness, numbness, paresthesia, abdominal pain, N/V/D/C, dysuria, urgency, frequency. She states she has experienced anxiety attacks in the past with the same associated symptoms.   Past Medical History  Diagnosis Date  . Cerebral palsy   . Seizure disorder   . Asthma   . Low sodium diet   . Headache(784.0)     frequently  . Seizures     last one in high school;takes Depakote daily  . Numbness     bilateral feet  . Muscle spasm     takes Flexeril daily  . Peripheral edema     occasionally  . Back pain     unknown   . Anxiety   . Liver disease   . Neuromuscular disorder     cerebral palsy   Past Surgical History  Procedure Laterality Date  . Leg surgery Bilateral   . Cesarean section  2007  . Tooth extraction N/A 02/16/2013    Procedure: EXTRACTION 16, 17, 32;  Surgeon: Georgia Lopes, DDS;  Location: [facility] OR;  Service: Oral Surgery;  Laterality: N/A;  . Cesarean section with bilateral tubal ligation Bilateral 03/03/2015    Procedure: CESAREAN SECTION WITH BILATERAL TUBAL LIGATION;  Surgeon: Willodean Rosenthal, MD;  Location: [facility];  Service: Obstetrics;  Laterality: Bilateral;  Requested 03/03/15 @ 3:30p  Ok per Cleo/Myra-TM   Family History  Problem  Relation Age of Onset  . Diabetes Maternal Grandfather   . Hypertension Maternal Grandfather    Social History  Substance Use Topics  . Smoking status: Former Smoker -- 0.25 packs/day for 7 years    Types: Cigarettes    Quit date: 11/05/2014  . Smokeless tobacco: Never Used  . Alcohol Use: No   OB History    Gravida Para Term Preterm AB TAB SAB Ectopic Multiple Living   2 2 2  0 0 0 0 0 0 2      Review of Systems  Constitutional: Negative for fever, chills, activity change, appetite change and fatigue.  HENT: Negative for congestion.   Eyes: Positive for visual disturbance.       Reports double vision.  Respiratory: Positive for chest tightness and shortness of breath. Negative for cough, wheezing and stridor.   Cardiovascular: Positive for palpitations. Negative for chest pain and leg swelling.  Gastrointestinal: Negative for nausea, vomiting, abdominal pain, diarrhea, constipation and abdominal distention.  Genitourinary: Negative for dysuria, urgency and frequency.  Musculoskeletal: Negative for myalgias, back pain, arthralgias, neck pain and neck stiffness.  Skin: Negative for color change, pallor, rash and wound.  Neurological: Positive for dizziness. Negative for syncope, weakness, light-headedness, numbness and headaches.  Psychiatric/Behavioral: The patient is nervous/anxious.   All other systems reviewed and are  negative.     Allergies  Shrimp; Codeine; Dust mite extract; Mold extract; Penicillins; and Soap  Home Medications   Prior to Admission medications   Medication Sig Start Date End Date Taking? Authorizing Provider  acetaminophen (TYLENOL) 325 MG tablet Take 650 mg by mouth every 6 (six) hours as needed.    Historical Provider, MD  albuterol (PROVENTIL HFA;VENTOLIN HFA) 108 (90 BASE) MCG/ACT inhaler Inhale 2 puffs into the lungs every 6 (six) hours as needed for wheezing (wheezing). 09/09/14   Tereso Newcomer, MD  enalapril (VASOTEC) 20 MG tablet Take 1  tablet (20 mg total) by mouth daily. 03/14/15   Levie Heritage, DO  ferrous sulfate 325 (65 FE) MG tablet Take 1 tablet (325 mg total) by mouth 2 (two) times daily with a meal. 03/05/15   Lynnae Prude, MD  hydrochlorothiazide (HYDRODIURIL) 25 MG tablet Take 1 tablet (25 mg total) by mouth daily. 04/01/15   Willodean Rosenthal, MD  ibuprofen (ADVIL,MOTRIN) 600 MG tablet Take 1 tablet (600 mg total) by mouth every 6 (six) hours as needed for mild pain, moderate pain or cramping. 03/05/15   Cheral Marker, CNM  oxyCODONE-acetaminophen (ROXICET) 5-325 MG per tablet Take 1 tablet by mouth every 6 (six) hours as needed for moderate pain or severe pain. 03/05/15   Lynnae Prude, MD  Pediatric Multiple Vitamins (FLINTSTONES MULTIVITAMIN PO) Take 2 tablets by mouth.    Historical Provider, MD    BP 154/85 mmHg  Pulse 77  Temp(Src) 98.2 F (36.8 C) (Oral)  Resp 18  SpO2 100% Physical Exam  Constitutional: She is oriented to person, place, and time. She appears well-developed and well-nourished. No distress.  HENT:  Head: Normocephalic and atraumatic.  Right Ear: External ear normal.  Left Ear: External ear normal.  Nose: Nose normal.  Mouth/Throat: Uvula is midline, oropharynx is clear and moist and mucous membranes are normal.  Eyes: Conjunctivae, EOM and lids are normal. Pupils are equal, round, and reactive to light. Right eye exhibits no discharge. Left eye exhibits no discharge. No scleral icterus.  Neck: Normal range of motion. Neck supple.  Cardiovascular: Normal rate, regular rhythm, normal heart sounds, intact distal pulses and normal pulses.   Pulmonary/Chest: Effort normal and breath sounds normal. No respiratory distress. She has no wheezes. She has no rales. She exhibits no tenderness.  Abdominal: Soft. Normal appearance and bowel sounds are normal. She exhibits no distension and no mass. There is no tenderness. There is no rigidity, no rebound and no guarding.   Musculoskeletal: Normal range of motion. She exhibits no edema or tenderness.  Neurological: She is alert and oriented to person, place, and time. She has normal strength. No cranial nerve deficit or sensory deficit. Gait normal.  Skin: Skin is warm, dry and intact. No rash noted. She is not diaphoretic. No erythema. No pallor.  Psychiatric: She has a normal mood and affect. Her speech is normal and behavior is normal. Judgment and thought content normal.  Nursing note and vitals reviewed.   ED Course  Procedures (including critical care time)  Labs Review Labs Reviewed - No data to display  Imaging Review Dg Chest 2 View  04/17/2015   CLINICAL DATA:  Shortness of breath for 2-3 days  EXAM: CHEST  2 VIEW  COMPARISON:  03/18/2014, chest CT 03/19/2014  FINDINGS: The heart size and mediastinal contours are within normal limits. Both lungs are clear. The visualized skeletal structures are unremarkable.  IMPRESSION: No active cardiopulmonary disease.  Electronically Signed   By: Christiana Pellant M.D.   On: 04/17/2015 09:40   I have personally reviewed and evaluated these images and lab results as part of my medical decision-making.   EKG Interpretation None      MDM   Final diagnoses:  Anxiety    29 year old female presents to the ED with anxiety. She reports associated chest tightness, palpitations, shortness of breath, dizziness, double vision. Denies loss of consciousness, numbness, paresthesia, abdominal pain, N/V/D/C, dysuria, urgency, frequency. States she has experienced anxiety attacks in the past with the same associated symptoms. In the ED, patient reports she feels anxious, however she denies additional symptoms.  Patient is afebrile. Vital signs stable. No tachycardia, tachpnea. No hypotension. O2 sat 99-100% on RA. Lungs clear to auscultation bilaterally. No wheezing or respiratory distress. No lower extremity edema. Normal neuro exam with no focal deficit. Patient ambulates  without difficulty. Patient denies history of DVT, malignancy, recent travel or immobility. PERC negative. EKG no acute ischemia. Low suspicion for PE or ACS. CXR negative for active cardiopulmonary disease.  Will treat with ativan and reasses. Patient reports improvement in symptoms s/p ativan. At this time, feel patient is stable for discharge.  Patient to follow-up with PCP. Return precautions discussed.   BP 154/85 mmHg  Pulse 77  Temp(Src) 98.2 F (36.8 C) (Oral)  Resp 18  SpO2 100%     Mady Gemma, PA-C 04/17/15 1908  Gerhard Munch, MD 04/18/15 [id number]

## 2015-04-17 NOTE — Discharge Instructions (Signed)
1. Medications: usual home medications 2. Treatment: rest, drink plenty of fluids 3. Follow Up: please followup with your primary doctor for discussion of your diagnoses and further evaluation after today's visit; please return to the ER for severe chest pain or shortness of breath, fever, new or worsening symptoms   Panic Attacks Panic attacks are sudden, short feelings of great fear or discomfort. You may have them for no reason when you are relaxed, when you are uneasy (anxious), or when you are sleeping.  HOME CARE  Take all your medicines as told.  Check with your doctor before starting new medicines.  Keep all doctor visits. GET HELP IF:  You are not able to take your medicines as told.  Your symptoms do not get better.  Your symptoms get worse. GET HELP RIGHT AWAY IF:  Your attacks seem different than your normal attacks.  You have thoughts about hurting yourself or others.  You take panic attack medicine and you have a side effect. MAKE SURE YOU:  Understand these instructions.  Will watch your condition.  Will get help right away if you are not doing well or get worse. Document Released: 08/25/2010 Document Revised: 05/13/2013 Document Reviewed: 03/06/2013 [facility] Patient Information 2015 [place], [place]. This information is not intended to replace advice given to you by your health care provider. Make sure you discuss any questions you have with your health care provider.

## 2015-04-21 ENCOUNTER — Ambulatory Visit: Payer: Medicare Other | Admitting: Neurology

## 2015-04-27 ENCOUNTER — Ambulatory Visit: Payer: Medicare Other | Admitting: Neurology

## 2015-10-27 ENCOUNTER — Emergency Department (HOSPITAL_COMMUNITY)
Admission: EM | Admit: 2015-10-27 | Discharge: 2015-10-27 | Disposition: A | Discharge status: Home / Self Care | Payer: Medicare Other | Attending: Emergency Medicine | Admitting: Emergency Medicine

## 2015-10-27 DIAGNOSIS — Z880 Allergy status to penicillin: Secondary | ICD-10-CM | POA: Diagnosis not present

## 2015-10-27 DIAGNOSIS — Z8669 Personal history of other diseases of the nervous system and sense organs: Secondary | ICD-10-CM | POA: Diagnosis not present

## 2015-10-27 DIAGNOSIS — Z8659 Personal history of other mental and behavioral disorders: Secondary | ICD-10-CM | POA: Diagnosis not present

## 2015-10-27 DIAGNOSIS — R112 Nausea with vomiting, unspecified: Secondary | ICD-10-CM | POA: Diagnosis present

## 2015-10-27 DIAGNOSIS — Z87891 Personal history of nicotine dependence: Secondary | ICD-10-CM | POA: Diagnosis not present

## 2015-10-27 DIAGNOSIS — J45909 Unspecified asthma, uncomplicated: Secondary | ICD-10-CM | POA: Diagnosis not present

## 2015-10-27 DIAGNOSIS — Z8719 Personal history of other diseases of the digestive system: Secondary | ICD-10-CM | POA: Insufficient documentation

## 2015-10-27 DIAGNOSIS — R1084 Generalized abdominal pain: Secondary | ICD-10-CM | POA: Insufficient documentation

## 2015-10-27 DIAGNOSIS — Z79899 Other long term (current) drug therapy: Secondary | ICD-10-CM | POA: Diagnosis not present

## 2015-10-27 DIAGNOSIS — B349 Viral infection, unspecified: Secondary | ICD-10-CM | POA: Diagnosis not present

## 2015-10-27 DIAGNOSIS — R630 Anorexia: Secondary | ICD-10-CM | POA: Diagnosis not present

## 2015-10-27 DIAGNOSIS — Z3202 Encounter for pregnancy test, result negative: Secondary | ICD-10-CM | POA: Insufficient documentation

## 2015-10-27 LAB — COMPREHENSIVE METABOLIC PANEL
ALT: 12 U/L — AB (ref 14–54)
ANION GAP: 8 (ref 5–15)
AST: 17 U/L (ref 15–41)
Albumin: 3.8 g/dL (ref 3.5–5.0)
Alkaline Phosphatase: 95 U/L (ref 38–126)
BUN: 10 mg/dL (ref 6–20)
CHLORIDE: 105 mmol/L (ref 101–111)
CO2: 25 mmol/L (ref 22–32)
CREATININE: 0.6 mg/dL (ref 0.44–1.00)
Calcium: 8.9 mg/dL (ref 8.9–10.3)
GFR calc non Af Amer: >60 mL/min (ref >60)
Glucose, Bld: 90 mg/dL (ref 65–99)
POTASSIUM: 3.7 mmol/L (ref 3.5–5.1)
SODIUM: 138 mmol/L (ref 135–145)
Total Bilirubin: 0.3 mg/dL (ref 0.3–1.2)
Total Protein: 7.5 g/dL (ref 6.5–8.1)

## 2015-10-27 LAB — CBC WITH DIFFERENTIAL/PLATELET
BASOS ABS: 0 10*3/uL (ref 0.0–0.1)
Basophils Relative: 0 %
Eosinophils Absolute: 0 10*3/uL (ref 0.0–0.7)
Eosinophils Relative: 0 %
HCT: 35.8 % — ABNORMAL LOW (ref 36.0–46.0)
Hemoglobin: 10.9 g/dL — ABNORMAL LOW (ref 12.0–15.0)
Lymphocytes Relative: 57 %
Lymphs Abs: 2 10*3/uL (ref 0.7–4.0)
MCH: 25.3 pg — ABNORMAL LOW (ref 26.0–34.0)
MCHC: 30.4 g/dL (ref 30.0–36.0)
MCV: 83.3 fL (ref 78.0–100.0)
MONO ABS: 0.2 10*3/uL (ref 0.1–1.0)
Monocytes Relative: 5 %
Neutro Abs: 1.4 10*3/uL — ABNORMAL LOW (ref 1.7–7.7)
Neutrophils Relative %: 38 %
Platelets: 343 10*3/uL (ref 150–400)
RBC: 4.3 MIL/uL (ref 3.87–5.11)
RDW: 13.9 % (ref 11.5–15.5)
WBC: 3.6 10*3/uL — AB (ref 4.0–10.5)

## 2015-10-27 LAB — URINALYSIS, ROUTINE W REFLEX MICROSCOPIC
Glucose, UA: NEGATIVE mg/dL
Hgb urine dipstick: NEGATIVE
KETONES UR: NEGATIVE mg/dL
LEUKOCYTES UA: NEGATIVE
Nitrite: NEGATIVE
Protein, ur: NEGATIVE mg/dL
Specific Gravity, Urine: 1.029 (ref 1.005–1.030)
pH: 6 (ref 5.0–8.0)

## 2015-10-27 LAB — LIPASE, BLOOD: Lipase: 32 U/L (ref 11–51)

## 2015-10-27 LAB — PREGNANCY, URINE: PREG TEST UR: NEGATIVE

## 2015-10-27 MED ORDER — ONDANSETRON HCL 4 MG PO TABS: 4.0000 mg | ORAL_TABLET | Freq: Four times a day (QID) | ORAL | Status: DC

## 2015-10-27 MED ORDER — ONDANSETRON 4 MG PO TBDP
4.0000 mg | ORAL_TABLET | Freq: Once | ORAL | Status: AC
  Administered 2015-10-27: 4 mg via ORAL
  Filled 2015-10-27: qty 1

## 2015-10-27 NOTE — Discharge Instructions (Signed)

## 2015-10-27 NOTE — ED Provider Notes (Signed)
CSN: [id number]     Arrival date & time 10/27/15  1730 History   By signing my name below, I, Marisue HumbleMichelle Chaffee, attest that this documentation has been prepared under the direction and in the presence of non-physician practitioner, Cheri FowlerKayla Issaih Kaus, PA-C. Electronically Signed: Marisue HumbleMichelle Chaffee, Scribe. 10/27/2015. 6:05 PM.   Chief Complaint  Patient presents with  . Emesis   The history is provided by the patient. No language interpreter was used.   HPI Comments:  [name] is a 30 y.o. female with PMHx of seizures on depakote, cerebral palsy, and HTN who presents to the Emergency Department complaining of 2 episodes of non-bloody emesis two days ago and nausea today. Pt reports associated decreased appetite, subjective fever, diaphoresis, generalized body aches, abdominal cramping, fatigue and loose stools. Pt drank ginger ale with some relief; no other treatments attempted PTA. Pt denies CP, SOB, cough, dysuria, hematuria, abdominal surgeries, recent travel, or recent antibiotic use.  Denies EtOH or drug use.   Past Medical History  Diagnosis Date  . Cerebral palsy   . Seizure disorder   . Asthma   . Low sodium diet   . Headache(784.0)     frequently  . Seizures     last one in high school;takes Depakote daily  . Numbness     bilateral feet  . Muscle spasm     takes Flexeril daily  . Peripheral edema     occasionally  . Back pain     unknown   . Anxiety   . Liver disease   . Neuromuscular disorder     cerebral palsy   Past Surgical History  Procedure Laterality Date  . Leg surgery Bilateral   . Cesarean section  2007  . Tooth extraction N/A 02/16/2013    Procedure: EXTRACTION 16, 17, 32;  Surgeon: Georgia LopesScott M Jensen, DDS;  Location: [facility] OR;  Service: Oral Surgery;  Laterality: N/A;  . Cesarean section with bilateral tubal ligation Bilateral 03/03/2015    Procedure: CESAREAN SECTION WITH BILATERAL TUBAL LIGATION;  Surgeon: Willodean Rosenthalarolyn Harraway-Smith, MD;  Location: [facility];  Service:  Obstetrics;  Laterality: Bilateral;  Requested 03/03/15 @ 3:30p  Ok per Cleo/Myra-TM   Family History  Problem Relation Age of Onset  . Diabetes Maternal Grandfather   . Hypertension Maternal Grandfather    Social History  Substance Use Topics  . Smoking status: Former Smoker -- 0.25 packs/day for 7 years    Types: Cigarettes    Quit date: 11/05/2014  . Smokeless tobacco: Never Used  . Alcohol Use: No   OB History    Gravida Para Term Preterm AB TAB SAB Ectopic Multiple Living   2 2 2  0 0 0 0 0 0 2     Review of Systems  Constitutional: Positive for fever (subjective), diaphoresis, appetite change and fatigue.  Respiratory: Negative for cough and shortness of breath.   Cardiovascular: Negative for chest pain.  Gastrointestinal: Positive for nausea, vomiting, abdominal pain and diarrhea.  Genitourinary: Negative for dysuria and hematuria.  Musculoskeletal: Positive for myalgias.  All other systems reviewed and are negative.   Allergies  Shrimp; Codeine; Dust mite extract; Mold extract; Penicillins; and Soap  Home Medications   Prior to Admission medications   Medication Sig Start Date End Date Taking? Authorizing Provider  acetaminophen (TYLENOL) 325 MG tablet Take 650 mg by mouth every 6 (six) hours as needed.    Historical Provider, MD  albuterol (PROVENTIL HFA;VENTOLIN HFA) 108 (90 BASE) MCG/ACT inhaler Inhale 2  puffs into the lungs every 6 (six) hours as needed for wheezing (wheezing). 09/09/14   Tereso Newcomer, MD  enalapril (VASOTEC) 20 MG tablet Take 1 tablet (20 mg total) by mouth daily. 03/14/15   Levie Heritage, DO  ferrous sulfate 325 (65 FE) MG tablet Take 1 tablet (325 mg total) by mouth 2 (two) times daily with a meal. 03/05/15   Lynnae Prude, MD  hydrochlorothiazide (HYDRODIURIL) 25 MG tablet Take 1 tablet (25 mg total) by mouth daily. 04/01/15   Willodean Rosenthal, MD  ibuprofen (ADVIL,MOTRIN) 600 MG tablet Take 1 tablet (600 mg total) by mouth every  6 (six) hours as needed for mild pain, moderate pain or cramping. Patient not taking: Reported on 04/17/2015 03/05/15   Cheral Marker, CNM  ibuprofen (ADVIL,MOTRIN) 800 MG tablet Take 800 mg by mouth every 6 (six) hours as needed for moderate pain.  03/17/15   Historical Provider, MD  loratadine (CLARITIN) 10 MG tablet Take 10 mg by mouth daily. 03/17/15   Historical Provider, MD  montelukast (SINGULAIR) 10 MG tablet Take 10 mg by mouth at bedtime. 03/17/15   Historical Provider, MD  oxyCODONE-acetaminophen (ROXICET) 5-325 MG per tablet Take 1 tablet by mouth every 6 (six) hours as needed for moderate pain or severe pain. 03/05/15   Lynnae Prude, MD  Pediatric Multiple Vitamins (FLINTSTONES MULTIVITAMIN PO) Take 2 tablets by mouth.    Historical Provider, MD   BP 140/102 mmHg  Pulse 65  Temp(Src) 99.2 F (37.3 C)  Resp 18  SpO2 99%  LMP 10/27/2015 Physical Exam  Constitutional: She is oriented to person, place, and time. She appears well-developed and well-nourished.  Non-toxic appearance. She does not have a sickly appearance. She does not appear ill.  HENT:  Head: Normocephalic and atraumatic.  Mouth/Throat: Oropharynx is clear and moist and mucous membranes are normal.  Eyes: Conjunctivae are normal. Pupils are equal, round, and reactive to light.  Neck: Normal range of motion. Neck supple.  Cardiovascular: Normal rate, regular rhythm and normal heart sounds.   No murmur heard. Pulmonary/Chest: Effort normal and breath sounds normal. No accessory muscle usage or stridor. No respiratory distress. She has no wheezes. She has no rhonchi. She has no rales.  Abdominal: Soft. Bowel sounds are normal. She exhibits no distension. There is no tenderness. There is no rebound and no guarding.  Musculoskeletal: Normal range of motion.  Lymphadenopathy:    She has no cervical adenopathy.  Neurological: She is alert and oriented to person, place, and time.  Speech clear without dysarthria.   Skin: Skin is warm and dry.  Psychiatric: She has a normal mood and affect. Her behavior is normal.    ED Course  Procedures  DIAGNOSTIC STUDIES:  Oxygen Saturation is 99% on RA, normal by my interpretation.    COORDINATION OF CARE:  6:04 PM Will order blood work and UA. Will administer nausea medication and PO challenge. Discussed treatment plan with pt at bedside and pt agreed to plan.  Labs Review Labs Reviewed  CBC WITH DIFFERENTIAL/PLATELET - Abnormal; Notable for the following:    WBC 3.6 (*)    Hemoglobin 10.9 (*)    HCT 35.8 (*)    MCH 25.3 (*)    Neutro Abs 1.4 (*)    All other components within normal limits  COMPREHENSIVE METABOLIC PANEL - Abnormal; Notable for the following:    ALT 12 (*)    All other components within normal limits  URINALYSIS, ROUTINE W  REFLEX MICROSCOPIC (NOT AT [facility]) - Abnormal; Notable for the following:    Bilirubin Urine SMALL (*)    All other components within normal limits  LIPASE, BLOOD  PREGNANCY, URINE    Imaging Review No results found. I have personally reviewed and evaluated these images and lab results as part of my medical decision-making.   EKG Interpretation None      MDM   Final diagnoses:  Non-intractable vomiting with nausea, vomiting of unspecified type  Generalized abdominal pain  Viral illness   Patient presents with nausea, vomiting, and generalized abdominal pain for the past 2 days. Sick contacts with similar symptoms. Subjective fever and loose stools. No meds PTA. Patient states she has been drinking ginger ale. On exam, heart RRR, lungs CTAP, abdomen soft without tenderness no rebound or guarding or rigidity. Low suspicion for surgical abdomen. Moist mucous membranes. Will obtain UA, CBC, CMP, and lipase. Patient given Zofran.  Labs without acute abnormalities.  Doubt acute intra-abdominal pathology, metabolic derangement, pregnancy. Suspect viral illness. Patient able to tolerate PO intake.  Discussed  return precautions.  Patient agrees and acknowledges the above plan for discharge.   I personally performed the services described in this documentation, which was scribed in my presence. The recorded information has been reviewed and is accurate.       Cheri Fowler, PA-C 10/27/15 1949  Raeford Razor, MD 10/28/15 [phone]

## 2015-10-27 NOTE — ED Notes (Signed)
Pt reports she threw up Tuesday and Wednesday but has not thrown up today. Pt also reports fevers that are getting better. Pt has been able to keep ginger ale down today. Pt reports she has not tried any over the counter medications for fevers or body aches.

## 2015-10-27 NOTE — ED Notes (Signed)
Pt tolerated PO challenge of water and crackers well

## 2016-02-14 IMAGING — US US OB TRANSVAGINAL
1 series · 14 of 28 positions shown · non-contrast
Comparison: None of this gestation

CLINICAL DATA: Abdominal pain and first trimester pregnancy

EXAM:
OBSTETRIC <14 WK US AND TRANSVAGINAL OB US
TECHNIQUE: Both transabdominal and transvaginal ultrasound examinations were
performed for complete evaluation of the gestation as well as the
maternal uterus, adnexal regions, and pelvic cul-de-sac.
Transvaginal technique was performed to assess early pregnancy.

[Series 1: us ob transvaginal · 0.18mm/px · 14 of 49 slices shown]
[im 2/49]
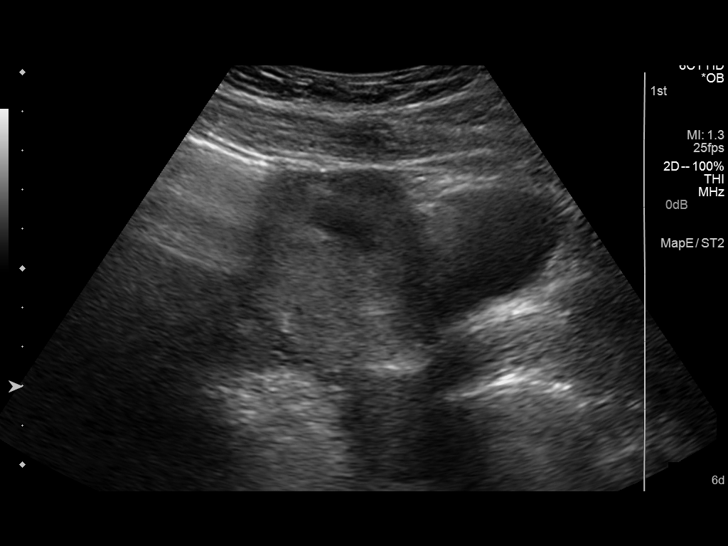
[im 6/49]
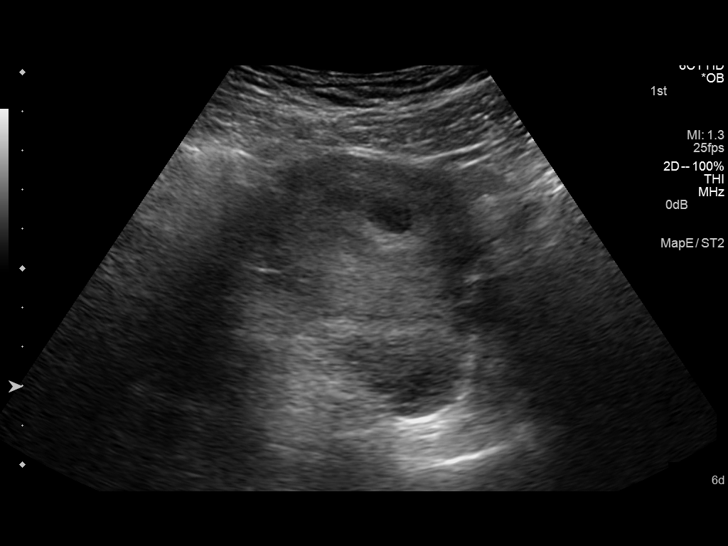
[im 9/49]
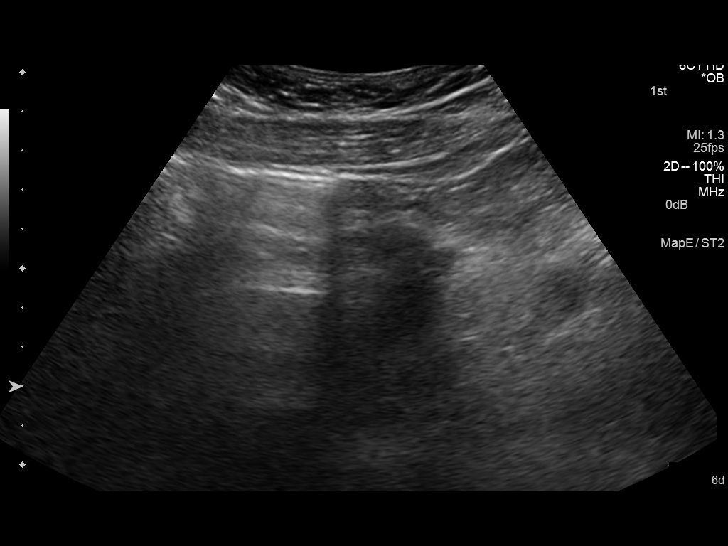
[im 13/49]
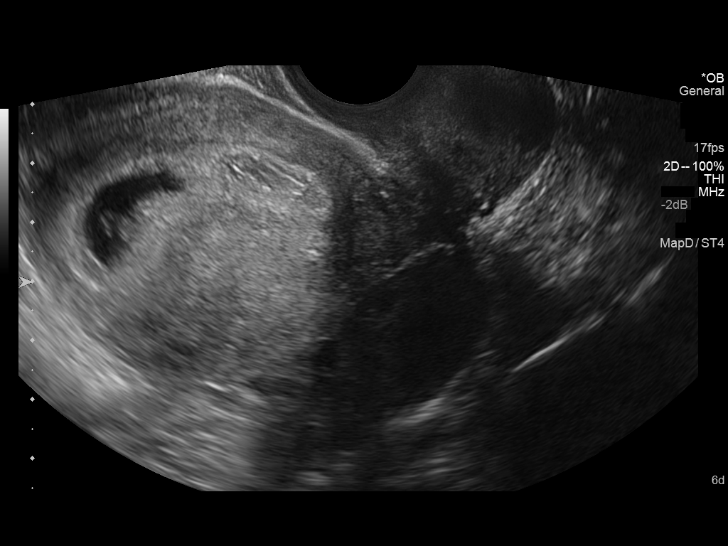
[im 17/49]
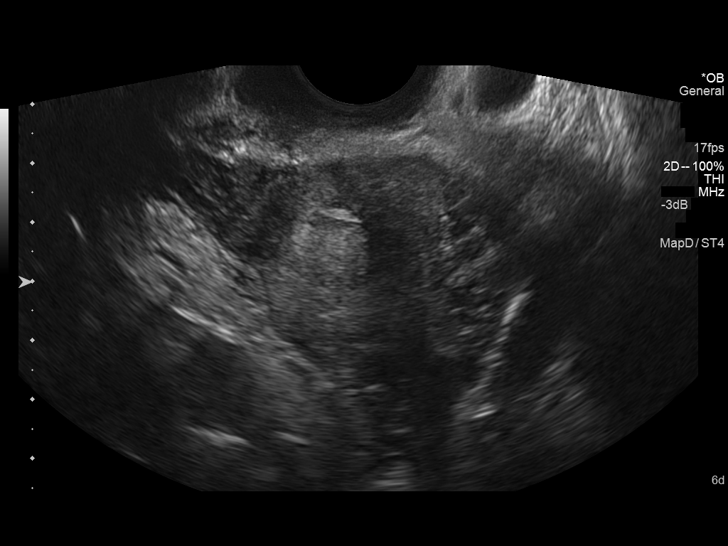
[im 20/49]
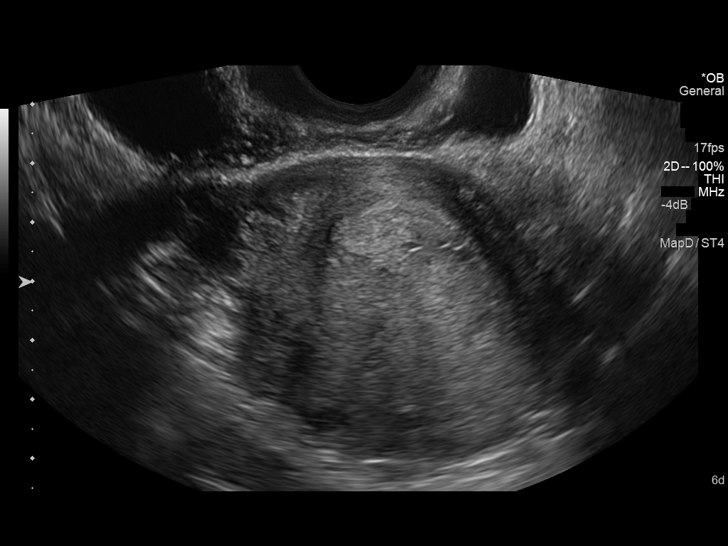
[im 24/49]
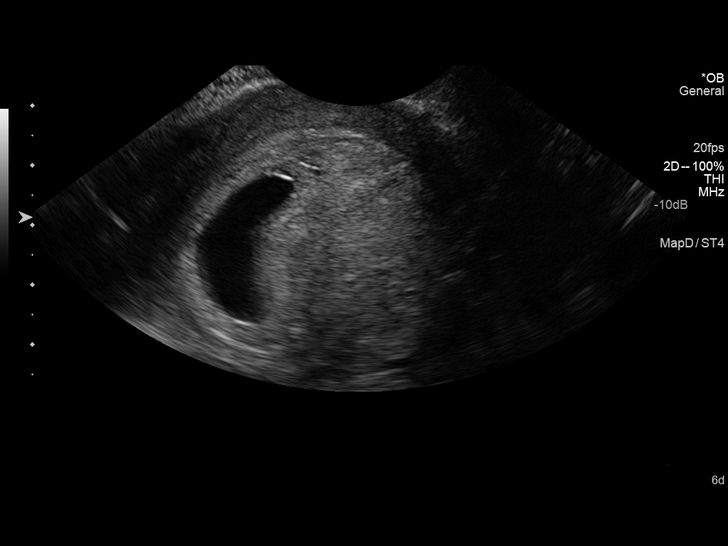
[im 27/49]
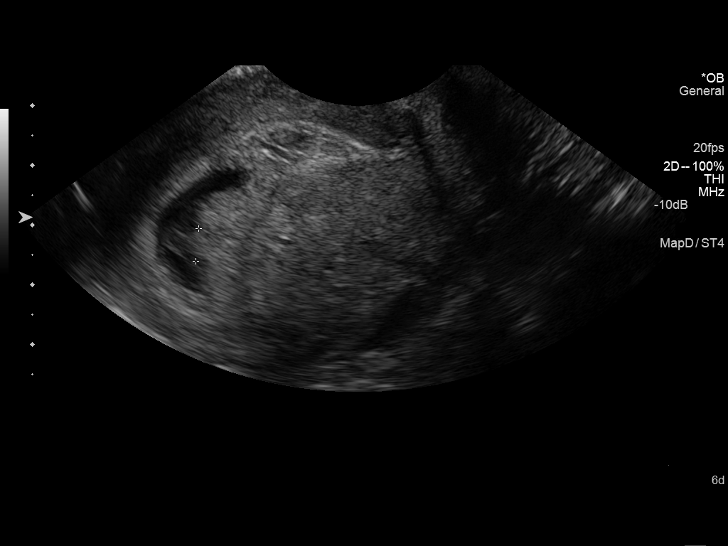
[im 31/49]
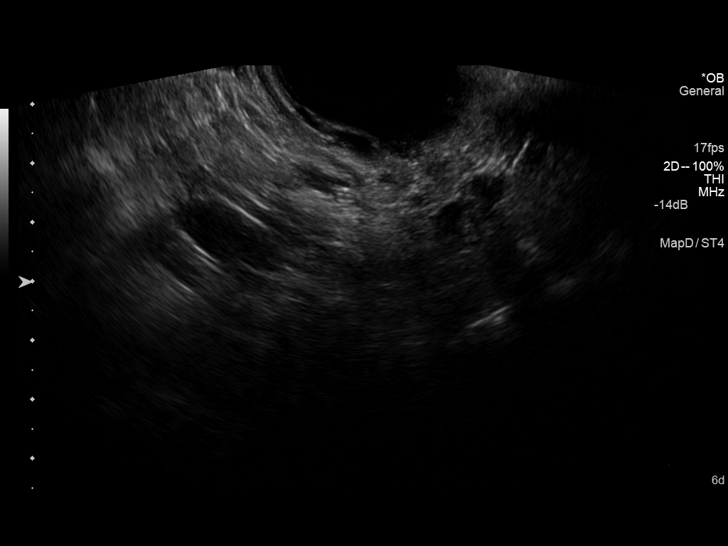
[im 34/49]
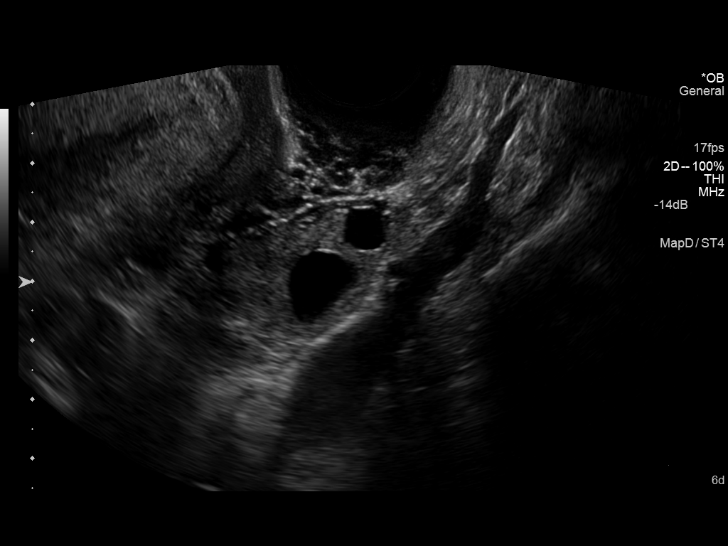
[im 38/49]
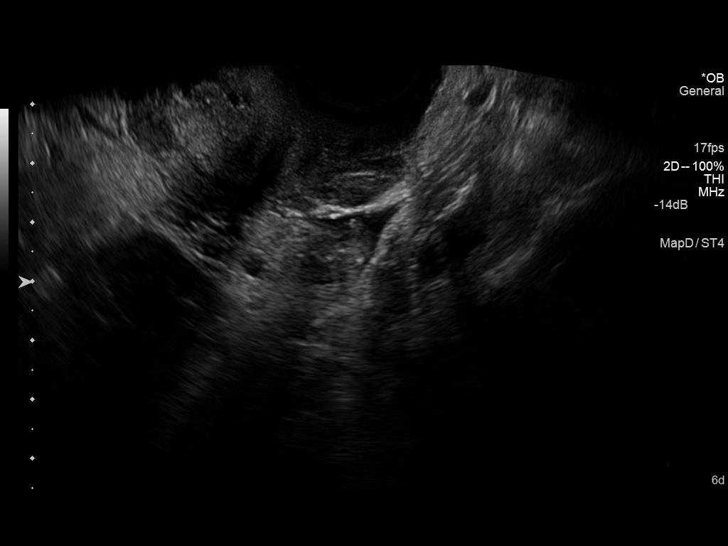
[im 41/49]
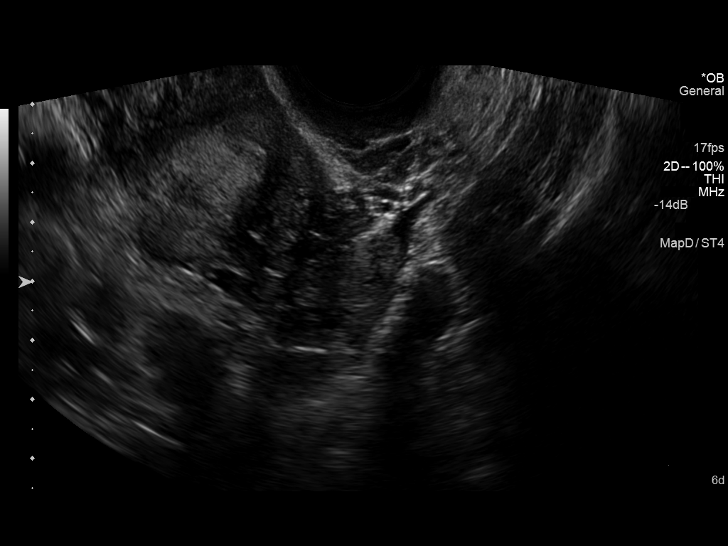
[im 45/49]
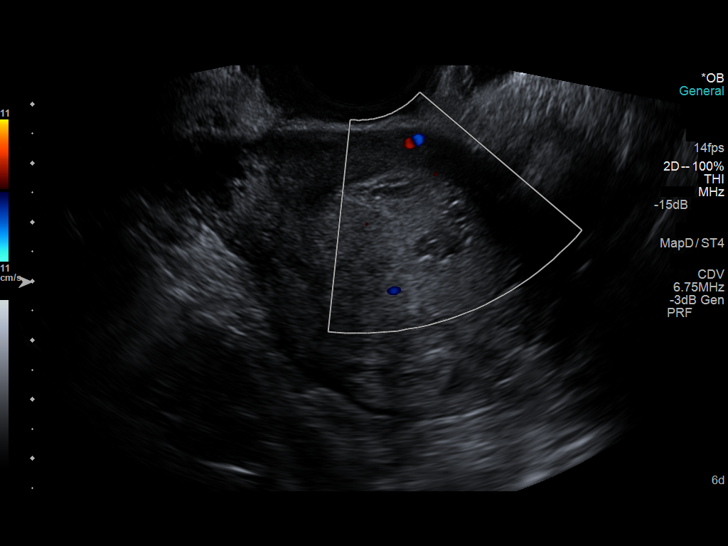
[im 49/49]
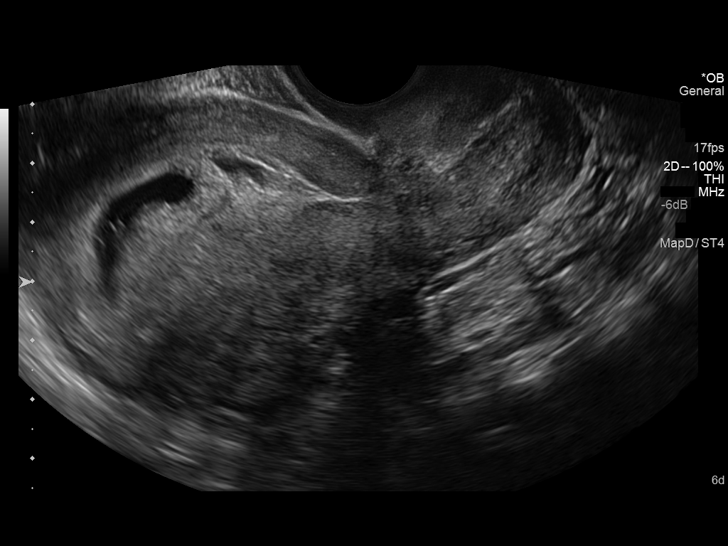

[14 of 28 positions shown; findings below may reference images not displayed]

FINDINGS: Intrauterine gestational sac: Present

Yolk sac:  Present

Embryo:  Present

Cardiac Activity: Present

Heart Rate:  116 bpm

CRL:   6  mm   6 w 2 d                  US EDC: 03/15/2015

Maternal uterus/adnexae: A small subchorionic hemorrhage is noted,
13 x 6 x 8 mm. The left ovary is visualized and is normal. The right
ovary was not visible. No free pelvic fluid.
IMPRESSION: 1. Single, living intrauterine gestation with estimated age 6 weeks
2 days.
2. Small subchronic hemorrhage, 13 x 6 x 8 mm.

## 2016-12-21 ENCOUNTER — Encounter (HOSPITAL_COMMUNITY): Payer: Self-pay | Admitting: Emergency Medicine

## 2016-12-21 ENCOUNTER — Emergency Department (HOSPITAL_COMMUNITY): Payer: Medicare Other

## 2016-12-21 ENCOUNTER — Emergency Department (HOSPITAL_COMMUNITY)
Admission: EM | Admit: 2016-12-21 | Discharge: 2016-12-21 | Disposition: A | Discharge status: Home / Self Care | Payer: Medicare Other | Attending: Emergency Medicine | Admitting: Emergency Medicine

## 2016-12-21 DIAGNOSIS — R519 Headache, unspecified: Secondary | ICD-10-CM

## 2016-12-21 DIAGNOSIS — J45909 Unspecified asthma, uncomplicated: Secondary | ICD-10-CM | POA: Diagnosis not present

## 2016-12-21 DIAGNOSIS — R791 Abnormal coagulation profile: Secondary | ICD-10-CM | POA: Diagnosis not present

## 2016-12-21 DIAGNOSIS — Z79899 Other long term (current) drug therapy: Secondary | ICD-10-CM | POA: Diagnosis not present

## 2016-12-21 DIAGNOSIS — R51 Headache: Secondary | ICD-10-CM | POA: Diagnosis present

## 2016-12-21 DIAGNOSIS — Z87891 Personal history of nicotine dependence: Secondary | ICD-10-CM | POA: Insufficient documentation

## 2016-12-21 LAB — RAPID URINE DRUG SCREEN, HOSP PERFORMED
AMPHETAMINES: NOT DETECTED
BARBITURATES: NOT DETECTED
Benzodiazepines: NOT DETECTED
Cocaine: NOT DETECTED
Opiates: NOT DETECTED
TETRAHYDROCANNABINOL: NOT DETECTED

## 2016-12-21 LAB — CBC WITH DIFFERENTIAL/PLATELET
Basophils Absolute: 0 10*3/uL (ref 0.0–0.1)
Basophils Relative: 0 %
Eosinophils Absolute: 0 10*3/uL (ref 0.0–0.7)
Eosinophils Relative: 0 %
HEMATOCRIT: 31.7 % — AB (ref 36.0–46.0)
HEMOGLOBIN: 9.6 g/dL — AB (ref 12.0–15.0)
LYMPHS ABS: 2.3 10*3/uL (ref 0.7–4.0)
LYMPHS PCT: 42 %
MCH: 23.1 pg — AB (ref 26.0–34.0)
MCHC: 30.3 g/dL (ref 30.0–36.0)
MCV: 76.2 fL — ABNORMAL LOW (ref 78.0–100.0)
MONOS PCT: 5 %
Monocytes Absolute: 0.3 10*3/uL (ref 0.1–1.0)
NEUTROS ABS: 3 10*3/uL (ref 1.7–7.7)
NEUTROS PCT: 53 %
Platelets: 401 10*3/uL — ABNORMAL HIGH (ref 150–400)
RBC: 4.16 MIL/uL (ref 3.87–5.11)
RDW: 16.1 % — ABNORMAL HIGH (ref 11.5–15.5)
WBC: 5.6 10*3/uL (ref 4.0–10.5)

## 2016-12-21 LAB — URINALYSIS, ROUTINE W REFLEX MICROSCOPIC
BILIRUBIN URINE: NEGATIVE
Glucose, UA: NEGATIVE mg/dL
Hgb urine dipstick: NEGATIVE
Ketones, ur: NEGATIVE mg/dL
Leukocytes, UA: NEGATIVE
Nitrite: NEGATIVE
PROTEIN: NEGATIVE mg/dL
Specific Gravity, Urine: 1.018 (ref 1.005–1.030)
pH: 6 (ref 5.0–8.0)

## 2016-12-21 LAB — COMPREHENSIVE METABOLIC PANEL
ALK PHOS: 103 U/L (ref 38–126)
ALT: 12 U/L — ABNORMAL LOW (ref 14–54)
ANION GAP: 9 (ref 5–15)
AST: 16 U/L (ref 15–41)
Albumin: 3.9 g/dL (ref 3.5–5.0)
BUN: 11 mg/dL (ref 6–20)
CO2: 25 mmol/L (ref 22–32)
Calcium: 9 mg/dL (ref 8.9–10.3)
Chloride: 107 mmol/L (ref 101–111)
Creatinine, Ser: 0.55 mg/dL (ref 0.44–1.00)
GFR calc non Af Amer: >60 mL/min (ref >60)
GLUCOSE: 85 mg/dL (ref 65–99)
Potassium: 3.5 mmol/L (ref 3.5–5.1)
Sodium: 141 mmol/L (ref 135–145)
Total Bilirubin: 0.5 mg/dL (ref 0.3–1.2)
Total Protein: 7.8 g/dL (ref 6.5–8.1)

## 2016-12-21 LAB — I-STAT TROPONIN, ED: Troponin i, poc: 0 ng/mL (ref 0.00–0.08)

## 2016-12-21 LAB — I-STAT BETA HCG BLOOD, ED (MC, WL, AP ONLY)

## 2016-12-21 LAB — APTT: aPTT: 32 seconds (ref 24–36)

## 2016-12-21 LAB — PROTIME-INR
INR: 1.07
PROTHROMBIN TIME: 14 s (ref 11.4–15.2)

## 2016-12-21 LAB — ETHANOL

## 2016-12-21 MED ORDER — LEVETIRACETAM 500 MG PO TABS
500.0000 mg | ORAL_TABLET | Freq: Two times a day (BID) | ORAL | 0 refills | Status: DC
Start: 2016-12-21 — End: 2020-03-30

## 2016-12-21 MED ORDER — ACETAMINOPHEN 325 MG PO TABS
650.0000 mg | ORAL_TABLET | Freq: Once | ORAL | Status: AC
  Administered 2016-12-21: 650 mg via ORAL
  Filled 2016-12-21: qty 2

## 2016-12-21 MED ORDER — LEVETIRACETAM 500 MG PO TABS: 500.0000 mg | ORAL_TABLET | Freq: Once | ORAL | Status: DC

## 2016-12-21 MED ORDER — SODIUM CHLORIDE 0.9 % IV BOLUS (SEPSIS)
500.0000 mL | Freq: Once | INTRAVENOUS | Status: AC
  Administered 2016-12-21: 500 mL via INTRAVENOUS

## 2016-12-21 MED ORDER — PROCHLORPERAZINE EDISYLATE 5 MG/ML IJ SOLN
10.0000 mg | Freq: Once | INTRAMUSCULAR | Status: AC
  Administered 2016-12-21: 10 mg via INTRAVENOUS
  Filled 2016-12-21: qty 2

## 2016-12-21 MED ORDER — LEVETIRACETAM 500 MG PO TABS
1000.0000 mg | ORAL_TABLET | Freq: Once | ORAL | Status: AC
  Administered 2016-12-21: 1000 mg via ORAL
  Filled 2016-12-21: qty 2

## 2016-12-21 NOTE — ED Notes (Signed)
Pt transported to MRI 

## 2016-12-21 NOTE — ED Provider Notes (Signed)
Dana Parks is a 31 y.o. female, with a history of cerebral palsy, headaches, and seizure disorder, presenting to the ED with a headache beginning around 1pm today. Pain is described as a pressure and seems to extend across her frontal region to the right parietal region, initially rated 10/10. She states she has had similar headaches in the past. Initially complained of blurry vision that has since improved upon my interview. Denies vision loss, vomiting, fever, acute weakness or sensory deficits. Denies seizure activity over past 24 hours. States her last seizure was a number of years ago.    HPI from Mathews RobinsonsJessica Mitchell, PA-C: "Dana Parks is a 31 y.o. female presenting with sudden onset pressure headache intermittently with visual disturbances. She was walking out of the doctors office and began feeling like her vision was going out and seeing double and blurry. She closed her eyes for a little while and when opening her eyes, she had double vision and blurry. Felt like she was having a seizure.She reports that she gets migraine auras where she feels like she is going to have a seizure. She has experienced these headaches before but reports that this seems worse. She is not experiencing the headache at this time and no longer has blurry vision, but she reports seeing " black spots in her vision". Has not taken anything for pain at home. No eye pain or redness. Also reports nausea. No fever, chills and worsening weakness on the left."  Past Medical History:  Diagnosis Date  . Anxiety   . Asthma   . Back pain    unknown   . Cerebral palsy (HCC)   . Headache(784.0)    frequently  . Liver disease   . Low sodium diet   . Muscle spasm    takes Flexeril daily  . Neuromuscular disorder (HCC)    cerebral palsy  . Numbness    bilateral feet  . Peripheral edema    occasionally  . Seizure disorder (HCC)   . Seizures (HCC)    last one in high school;takes Depakote daily    Physical Exam   BP 114/86   Pulse 91   Temp 97.5 F (36.4 C) (Oral)   Resp 18   Ht 4\' 11"  (1.499 m)   Wt 148 lb (67.1 kg)   LMP 12/14/2016   SpO2 100%   BMI 29.89 kg/m   Physical Exam  Constitutional: She is oriented to person, place, and time. She appears well-developed and well-nourished. No distress.  Patient sleeping upon my initial contact. Easily arousible.   HENT:  Head: Normocephalic and atraumatic.  Mouth/Throat: Oropharynx is clear and moist.  Eyes: Conjunctivae and EOM are normal. Pupils are equal, round, and reactive to light.  Neck: Normal range of motion. Neck supple.  Cardiovascular: Normal rate, regular rhythm, normal heart sounds and intact distal pulses.   Pulmonary/Chest: Effort normal and breath sounds normal. No respiratory distress.  Abdominal: Soft. There is no tenderness. There is no guarding.  Musculoskeletal: She exhibits no edema.  Lymphadenopathy:    She has no cervical adenopathy.  Neurological: She is alert and oriented to person, place, and time.  No sensory deficits. Strength 5/5 in all extremities. Patient states she can not stay sitting up without holding onto the railing due to dizziness. Coordination intact with finger to nose testing. Cranial nerves III-XII grossly intact. No facial droop.   Skin: Skin is warm and dry. She is not diaphoretic.  Psychiatric: She has a normal mood  and affect. Her behavior is normal.  Nursing note and vitals reviewed.   ED Course  Procedures   Results for orders placed or performed during the hospital encounter of 12/21/16  CBC with Differential  Result Value Ref Range   WBC 5.6 4.0 - 10.5 K/uL   RBC 4.16 3.87 - 5.11 MIL/uL   Hemoglobin 9.6 (L) 12.0 - 15.0 g/dL   HCT 84.1 (L) 32.4 - 40.1 %   MCV 76.2 (L) 78.0 - 100.0 fL   MCH 23.1 (L) 26.0 - 34.0 pg   MCHC 30.3 30.0 - 36.0 g/dL   RDW 02.7 (H) 25.3 - 66.4 %   Platelets 401 (H) 150 - 400 K/uL   Neutrophils Relative % 53 %   Neutro Abs 3.0 1.7 - 7.7 K/uL   Lymphocytes  Relative 42 %   Lymphs Abs 2.3 0.7 - 4.0 K/uL   Monocytes Relative 5 %   Monocytes Absolute 0.3 0.1 - 1.0 K/uL   Eosinophils Relative 0 %   Eosinophils Absolute 0.0 0.0 - 0.7 K/uL   Basophils Relative 0 %   Basophils Absolute 0.0 0.0 - 0.1 K/uL  Comprehensive metabolic panel  Result Value Ref Range   Sodium 141 135 - 145 mmol/L   Potassium 3.5 3.5 - 5.1 mmol/L   Chloride 107 101 - 111 mmol/L   CO2 25 22 - 32 mmol/L   Glucose, Bld 85 65 - 99 mg/dL   BUN 11 6 - 20 mg/dL   Creatinine, Ser 4.03 0.44 - 1.00 mg/dL   Calcium 9.0 8.9 - 47.4 mg/dL   Total Protein 7.8 6.5 - 8.1 g/dL   Albumin 3.9 3.5 - 5.0 g/dL   AST 16 15 - 41 U/L   ALT 12 (L) 14 - 54 U/L   Alkaline Phosphatase 103 38 - 126 U/L   Total Bilirubin 0.5 0.3 - 1.2 mg/dL   GFR calc non Af Amer >60 >60 mL/min   GFR calc Af Amer >60 >60 mL/min   Anion gap 9 5 - 15  Ethanol  Result Value Ref Range   Alcohol, Ethyl (B) <5 <5 mg/dL  Protime-INR  Result Value Ref Range   Prothrombin Time 14.0 11.4 - 15.2 seconds   INR 1.07   APTT  Result Value Ref Range   aPTT 32 24 - 36 seconds  Urine rapid drug screen (hosp performed)not at [facility]  Result Value Ref Range   Opiates NONE DETECTED NONE DETECTED   Cocaine NONE DETECTED NONE DETECTED   Benzodiazepines NONE DETECTED NONE DETECTED   Amphetamines NONE DETECTED NONE DETECTED   Tetrahydrocannabinol NONE DETECTED NONE DETECTED   Barbiturates NONE DETECTED NONE DETECTED  Urinalysis, Routine w reflex microscopic  Result Value Ref Range   Color, Urine YELLOW YELLOW   APPearance HAZY (A) CLEAR   Specific Gravity, Urine 1.018 1.005 - 1.030   pH 6.0 5.0 - 8.0   Glucose, UA NEGATIVE NEGATIVE mg/dL   Hgb urine dipstick NEGATIVE NEGATIVE   Bilirubin Urine NEGATIVE NEGATIVE   Ketones, ur NEGATIVE NEGATIVE mg/dL   Protein, ur NEGATIVE NEGATIVE mg/dL   Nitrite NEGATIVE NEGATIVE   Leukocytes, UA NEGATIVE NEGATIVE  I-stat troponin, ED (not at [facility], [facility])  Result Value Ref Range    Troponin i, poc 0.00 0.00 - 0.08 ng/mL   Comment 3          I-Stat Beta hCG blood, ED ([facility], [facility], [facility] only)  Result Value Ref Range   I-stat hCG, quantitative <5.0 <5 mIU/mL  Comment 3           Hemoglobin  Date Value Ref Range Status  12/21/2016 9.6 (L) 12.0 - 15.0 g/dL Final  14/78/2956 21.3 (L) 12.0 - 15.0 g/dL Final  08/65/7846 8.3 (L) 12.0 - 15.0 g/dL Final  96/29/5284 8.9 (L) 12.0 - 15.0 g/dL Final     Ct Head Wo Contrast  Result Date: 12/21/2016 CLINICAL DATA:  Acute headache, blurred vision, nausea EXAM: CT HEAD WITHOUT CONTRAST TECHNIQUE: Contiguous axial images were obtained from the base of the skull through the vertex without intravenous contrast. COMPARISON:  None. FINDINGS: Brain: No evidence of acute infarction, hemorrhage, hydrocephalus, extra-axial collection or mass lesion/mass effect. Vascular: No hyperdense vessel or unexpected calcification. Skull: Normal. Negative for fracture or focal lesion. Sinuses/Orbits: No acute finding. Other: None. IMPRESSION: Normal head CT without contrast Electronically Signed   By: Judie Petit.  Shick M.D.   On: 12/21/2016 14:45   Mr Brain Wo Contrast  Result Date: 12/21/2016 CLINICAL DATA:  Severe headache and postural dizziness. LEFT-sided weakness. EXAM: MRI HEAD WITHOUT CONTRAST TECHNIQUE: Multiplanar, multiecho pulse sequences of the brain and surrounding structures were obtained without intravenous contrast. COMPARISON:  None. FINDINGS: Mildly motion degraded examination. BRAIN: No reduced diffusion to suggest acute ischemia, hyperacute demyelination or hypercellular tumor. No susceptibility artifact to suggest hemorrhage. Thinned appearance of the posterior corpus callosum body, with symmetric undulating squared appearance of the occipital horns and, periventricular FLAIR T2 hyperintense signal posteriorly. No hydrocephalus . No suspicious parenchymal signal, masses or mass effect. No abnormal extra-axial fluid collections. VASCULAR: Normal major  intracranial vascular flow voids present at skull base. SKULL AND UPPER CERVICAL SPINE: No abnormal sellar expansion. No suspicious calvarial bone marrow signal. Craniocervical junction maintained. SINUSES/ORBITS: The mastoid air-cells and included paranasal sinuses are well-aerated. The included ocular globes and orbital contents are non-suspicious. OTHER: None. IMPRESSION: No acute intracranial process on this mildly motion degraded examination. 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 Electronically Signed   By: Awilda Metro M.D.   On: 12/21/2016 18:58    MDM  Clinical Course as of Dec 23 19  Fri Dec 21, 2016  1701 Patient voices improvement in her headache and how she feels overall. Rates her pain at 5/10.  [SJ]  1940 Patient states her headache and all other symptoms have resolved.  [SJ]    Clinical Course User Index [SJ] Concepcion Living    Took patient care handoff report from [place], [place]. Plan: Continue to assess and treat patient's headache. Obtain imaging and review results.   During ED course patient had resolution of her symptoms. She had no acute abnormalities on imaging. She was restarted on her Keppra. Advised to follow-up with neurology. Able to ambulate, eat, and drink. The patient was given instructions for home care as well as return precautions. Patient voices understanding of these instructions, accepts the plan, and is comfortable with discharge.    Vitals:   12/21/16 1142 12/21/16 1344 12/21/16 1536 12/21/16 1655  BP:  114/86 123/81 106/64  Pulse:  91 88 86  Resp:  18 17 16   Temp:      TempSrc:      SpO2:  100% 100% 97%  Weight: 148 lb (67.1 kg)     Height: 4\' 11"  (1.499 m)      Vitals:   12/21/16 1836 12/21/16 1845 12/21/16 1930 12/21/16 2030  BP: 126/85  (!) 133/94 135/85  Pulse: 93  84 86   Resp: 16  (!) 21 16  Temp:      TempSrc:       SpO2: 98% 100% 97%   Weight:      Height:            Anselm Pancoast, PA-C 12/22/16 0023    Tegeler, Canary Brim, MD 12/22/16 [phone]

## 2016-12-21 NOTE — ED Triage Notes (Addendum)
Per EMS pt waiting in lobby for PCP to have home medications filled. Pt has been out of all medications for 2 weeks. PCP unable to be seen until 1200. Pt verbalized awoke with headache, blurred vision, and nausea associated with seizure. Pt denies seizure but verbalizes these symptoms are her aura. Pt did not feel safe driving so called EMS instead of driving self here.

## 2016-12-21 NOTE — ED Notes (Signed)
Will attempt urine at a later time pt used bedpan before UA ordered

## 2016-12-21 NOTE — ED Notes (Signed)
Orthostatics completed. Pt complaint of severe headache and dizziness with standing; required stand by assist.

## 2016-12-21 NOTE — ED Notes (Signed)
Visual acuity complete; note pt does not have glasses with her.

## 2016-12-21 NOTE — ED Notes (Signed)
Pt ambulated independently w/ a steady gait.  Pt given ginger ale and a [place]key sandwich and is tolerating w/o difficulty.

## 2016-12-21 NOTE — ED Notes (Signed)
Jerilee HohStacy West RN at bedside attempting IV.

## 2016-12-21 NOTE — Discharge Instructions (Signed)
Be sure to stay well-hydrated. May use ibuprofen and/or Tylenol for headache or pain. Follow up with neurology as soon as possible on this manner. Return to the ED should any symptoms worsen. Begin taking your Keppra again to prevent seizures.

## 2016-12-21 NOTE — ED Provider Notes (Signed)
[facility]-EMERGENCY DEPT Provider Note   CSN: [id number] Arrival date & time: 12/21/16  1124     History   Chief Complaint Chief Complaint  Patient presents with  . Headache    HPI Dana Parks is a 31 y.o. female presenting with sudden onset pressure headache intermittently with visual disturbances. She was walking out of the doctors office and began feeling like her vision was going out and seeing double and blurry. She closed her eyes for a little while and when opening her eyes, she had double vision and blurry. Felt like she was having a seizure.She reports that she gets migraine auras where she feels like she is going to have a seizure. She has experienced these headaches before but reports that this seems worse. She is not experiencing the headache at this time and no longer has blurry vision, but she reports seeing " black spots in her vision". Has not taken anything for pain at home. No eye pain or redness. Also reports nausea. No fever, chills and worsening weakness on the left.  HPI  Past Medical History:  Diagnosis Date  . Anxiety   . Asthma   . Back pain    unknown   . Cerebral palsy (HCC)   . Headache(784.0)    frequently  . Liver disease   . Low sodium diet   . Muscle spasm    takes Flexeril daily  . Neuromuscular disorder (HCC)    cerebral palsy  . Numbness    bilateral feet  . Peripheral edema    occasionally  . Seizure disorder (HCC)   . Seizures (HCC)    last one in high school;takes Depakote daily    Patient Active Problem List   Diagnosis Date Noted  . Post-operative state 03/03/2015  . 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  . Asthma, mild intermittent, well-controlled 12/13/2014  . Low lying placenta without hemorrhage, antepartum   . Previous cesarean section complicating pregnancy, antepartum condition or complication 09/09/2014  . Epilepsy affecting pregnancy, 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  . Asthma complicating pregnancy, antepartum 09/09/2014  .  Cerebral palsy (HCC)   . Seizure disorder [facility])     Past Surgical History:  Procedure Laterality Date  . CESAREAN SECTION  2007  . CESAREAN SECTION WITH BILATERAL TUBAL LIGATION Bilateral 03/03/2015   Procedure: CESAREAN SECTION WITH BILATERAL TUBAL LIGATION;  Surgeon: Willodean Rosenthal, MD;  Location: [facility] ORS;  Service: Obstetrics;  Laterality: Bilateral;  Requested 03/03/15 @ 3:30p  Ok per Cleo/Myra-TM  . LEG SURGERY Bilateral   . TOOTH EXTRACTION N/A 02/16/2013   Procedure: EXTRACTION 16, 17, 32;  Surgeon: Georgia Lopes, DDS;  Location: [facility] OR;  Service: Oral Surgery;  Laterality: N/A;    OB History    Gravida Para Term Preterm AB Living   2 2 2  0 0 2   SAB TAB Ectopic Multiple Live Births   0 0 0 0 2       Home Medications    Prior to Admission medications   Medication Sig Start Date End Date Taking? Authorizing Provider  albuterol (PROVENTIL HFA;VENTOLIN HFA) 108 (90 BASE) MCG/ACT inhaler Inhale 2 puffs into the lungs every 6 (six) hours as needed for wheezing (wheezing). 09/09/14  Yes Anyanwu, Jethro Bastos, MD  enalapril (VASOTEC) 20 MG tablet Take 1 tablet (20 mg total) by mouth daily. 03/14/15  Yes Levie Heritage, DO  ferrous sulfate 325 (65 FE) MG tablet Take 1 tablet (325 mg total) by mouth 2 (two) times daily with a  meal. 03/05/15  Yes Lynnae Prude, MD  ibuprofen (ADVIL,MOTRIN) 800 MG tablet Take 800 mg by mouth every 6 (six) hours as needed for moderate pain.  03/17/15  Yes [provider]  levETIRAcetam (KEPPRA) 500 MG tablet Take 500 mg by mouth 2 (two) times daily.   Yes [provider]  montelukast (SINGULAIR) 10 MG tablet Take 10 mg by mouth at bedtime. 03/17/15  Yes [provider]  tiZANidine (ZANAFLEX) 4 MG tablet Take 4 mg by mouth at bedtime.   Yes [provider]  hydrochlorothiazide (HYDRODIURIL) 25 MG tablet Take 1 tablet (25 mg total) by mouth daily. Patient not taking: Reported on 12/21/2016 04/01/15   Willodean Rosenthal, MD  ibuprofen (ADVIL,MOTRIN) 600 MG tablet Take 1 tablet (600 mg total) by mouth every 6 (six) hours as needed for mild pain, moderate pain or cramping. Patient not taking: Reported on 12/21/2016 03/05/15   Cheral Marker, CNM  ondansetron (ZOFRAN) 4 MG tablet Take 1 tablet (4 mg total) by mouth every 6 (six) hours. Patient not taking: Reported on 12/21/2016 10/27/15   Cheri Fowler, PA-C  oxyCODONE-acetaminophen (ROXICET) 5-325 MG per tablet Take 1 tablet by mouth every 6 (six) hours as needed for moderate pain or severe pain. Patient not taking: Reported on 12/21/2016 03/05/15   Lynnae Prude, MD    Family History Family History  Problem Relation Age of Onset  . Diabetes Maternal Grandfather   . Hypertension Maternal Grandfather     Social History Social History  Substance Use Topics  . Smoking status: Former Smoker    Packs/day: 0.25    Years: 7.00    Types: Cigarettes    Quit date: 11/05/2014  . Smokeless tobacco: Never Used  . Alcohol use No     Allergies   Shrimp [shellfish allergy]; Codeine; Dust mite extract; Mold extract [trichophyton]; Penicillins; and Soap   Review of Systems Review of Systems  Constitutional: Negative for chills and fever.  HENT: Negative for drooling, ear pain, sore throat and trouble swallowing.   Eyes: Positive for visual disturbance. Negative for photophobia, pain, discharge, redness and itching.  Respiratory: Negative for cough, shortness of breath, wheezing and stridor.   Cardiovascular: Negative for chest pain and palpitations.  Gastrointestinal: Positive for nausea. Negative for abdominal pain, diarrhea and vomiting.  Genitourinary: Negative for dysuria and hematuria.  Musculoskeletal: Negative for arthralgias, back pain, myalgias, neck pain and neck stiffness.  Skin: Negative for color change, pallor, rash and wound.  Neurological: Positive for dizziness, weakness, light-headedness and headaches. Negative for seizures,  syncope, facial asymmetry, speech difficulty and numbness.       Patient with cerebral palsy and mild speech deficit and left sided weakness at baseline. She reports worsening left sided weakness.       Physical Exam Updated Vital Signs BP 123/81 (BP Location: Left Arm)   Pulse 88   Temp 97.5 F (36.4 C) (Oral)   Resp 17   Ht 4\' 11"  (1.499 m)   Wt 148 lb (67.1 kg)   LMP 12/14/2016   SpO2 100%   BMI 29.89 kg/m   Physical Exam  Constitutional: She is oriented to person, place, and time. She appears well-developed and well-nourished. No distress.  Afebrile, nontoxic-appearing, lying comfortably in no acute distress.  HENT:  Head: Normocephalic and atraumatic.  Mouth/Throat: Oropharynx is clear and moist. No oropharyngeal exudate.  Eyes: Conjunctivae and EOM are normal. Pupils are equal, round, and reactive to light. Right eye exhibits no discharge.  Left eye exhibits no discharge. No scleral icterus.  Neck: Normal range of motion. Neck supple. No JVD present.  No meningeal sign  Cardiovascular: Normal rate, regular rhythm, normal heart sounds and intact distal pulses.   No murmur heard. Pulmonary/Chest: Effort normal and breath sounds normal. No stridor. No respiratory distress. She has no wheezes. She has no rales. She exhibits no tenderness.  Abdominal: Soft. She exhibits no distension. There is no tenderness. There is no guarding.  Musculoskeletal: Normal range of motion. She exhibits no edema, tenderness or deformity.  Neurological: She is alert and oriented to person, place, and time. No cranial nerve deficit or sensory deficit. She exhibits normal muscle tone. Coordination normal.  Neurologic Exam:  - Mental status: Patient is alert and cooperative. Fluent speech and words are clear. Coherent thought processes and insight is good. Patient is oriented x 4 to person, place, time and event.  - Cranial nerves:  CN III, IV, VI: pupils equally round, reactive to light both direct and  conscensual and normal accommodation. Full extra-ocular movement. CN VII : muscles of facial expression intact. CN X :  midline uvula. XI strength of sternocleidomastoid and trapezius muscles 5/5, XII: tongue is midline when protruded. - Motor: No involuntary movements. Muscle tone and bulk normal throughout. Muscle strength is 5/5 in bilateral shoulder abduction, elbow flexion and extension, grip,   4/5 strength left hip extension, flexion, leg flexion and extension, ankle dorsiflexion and plantar flexion.  - Sensory: Proprioception, light tough sensation intact in all extremities.  - Cerebellar: rapid alternating movements and point to point movement intact in upper and lower extremities. Upper extremities radpid alternating slowed. Slightly more difficulty with heal to chin on the left. Patient was able to stand, but too weak to walk.  Skin: Skin is warm and dry. No rash noted. She is not diaphoretic. No erythema. No pallor.  Psychiatric: She has a normal mood and affect.  Nursing note and vitals reviewed.    ED Treatments / Results  Labs (all labs ordered are listed, but only abnormal results are displayed) Labs Reviewed  CBC WITH DIFFERENTIAL/PLATELET - Abnormal; Notable for the following:       Result Value   Hemoglobin 9.6 (*)    HCT 31.7 (*)    MCV 76.2 (*)    MCH 23.1 (*)    RDW 16.1 (*)    Platelets 401 (*)    All other components within normal limits  PROTIME-INR  APTT  COMPREHENSIVE METABOLIC PANEL  ETHANOL  RAPID URINE DRUG SCREEN, HOSP PERFORMED  URINALYSIS, ROUTINE W REFLEX MICROSCOPIC  I-STAT TROPOININ, ED  I-STAT BETA HCG BLOOD, ED ([facility], [facility], [facility] ONLY)    EKG  EKG Interpretation  Date/Time:  Friday Dec 21 2016 14:52:53 EDT Ventricular Rate:  88 PR Interval:    QRS Duration: 80 QT Interval:  369 QTC Calculation: 447 R Axis:   81 Text Interpretation:  Sinus rhythm Borderline short PR interval Confirmed by RAY MD, DANIELLE ([id number]) on 12/21/2016 3:51:44 PM         Radiology Ct Head Wo Contrast  Result Date: 12/21/2016 CLINICAL DATA:  Acute headache, blurred vision, nausea EXAM: CT HEAD WITHOUT CONTRAST TECHNIQUE: Contiguous axial images were obtained from the base of the skull through the vertex without intravenous contrast. COMPARISON:  None. FINDINGS: Brain: No evidence of acute infarction, hemorrhage, hydrocephalus, extra-axial collection or mass lesion/mass effect. Vascular: No hyperdense vessel or unexpected calcification. Skull: Normal. Negative for fracture or focal lesion. Sinuses/Orbits: No acute  finding. Other: None. IMPRESSION: Normal head CT without contrast Electronically Signed   By: Judie Petit.  Shick M.D.   On: 12/21/2016 14:45    Procedures Procedures (including critical care time)  Medications Ordered in ED Medications  levETIRAcetam (KEPPRA) tablet 1,000 mg (1,000 mg Oral Given 12/21/16 1526)  acetaminophen (TYLENOL) tablet 650 mg (650 mg Oral Given 12/21/16 1604)  sodium chloride 0.9 % bolus 500 mL (500 mLs Intravenous New Bag/Given 12/21/16 1604)  prochlorperazine (COMPAZINE) injection 10 mg (10 mg Intravenous Given 12/21/16 1604)     Initial Impression / Assessment and Plan / ED Course  I have reviewed the triage vital signs and the nursing notes.  Pertinent labs & imaging results that were available during my care of the patient were reviewed by me and considered in my medical decision making (see chart for details).    Patient presents with headache with preceding aura and visual disturbances. Hx of cerebral palsy with left sided weakness at baseline but worse today.  On reassessment, patient appeared to have better strength on the left but still poor effort. Nurse reported getting equal strength and slightly different story form patient just prior.   She reported that her headache had returned. Will treat pain and reassess.  Patient was discussed with Dr. Rosalia Hammers who has seen patient and agrees with assessment and plan. Transferred  patient care at end of shift to Harolyn Rutherford, PA-C pending labs and CT head results. Anticipate discharge home if labs and CT unremarkable. May need to obtain MRI with possible new/ worsening focal deficit and headache. Treat as appropriate based on any new findings.  Final Clinical Impressions(s) / ED Diagnoses   Final diagnoses:  Bad headache    New Prescriptions New Prescriptions   No medications on file     Gregary Cromer 12/21/16 1626    Margarita Grizzle, MD 12/22/16 1053

## 2016-12-21 NOTE — ED Notes (Signed)
Bed: [id number] Expected date:  Expected time:  Means of arrival:  Comments: EMS-headache/SZ

## 2017-01-07 ENCOUNTER — Encounter: Payer: Self-pay | Admitting: Neurology

## 2017-01-07 ENCOUNTER — Ambulatory Visit (INDEPENDENT_AMBULATORY_CARE_PROVIDER_SITE_OTHER): Payer: Medicare Other | Admitting: Neurology

## 2017-01-07 VITALS — BP 132/84 | HR 53 | Ht 59.0 in | Wt 152.0 lb

## 2017-01-07 DIAGNOSIS — G43111 Migraine with aura, intractable, with status migrainosus: Secondary | ICD-10-CM | POA: Diagnosis not present

## 2017-01-07 MED ORDER — PREDNISONE 20 MG PO TABS: ORAL_TABLET | ORAL | 0 refills | Status: DC

## 2017-01-07 MED ORDER — DIPHENHYDRAMINE HCL 50 MG/ML IJ SOLN: 25.0000 mg | Freq: Once | INTRAMUSCULAR | Status: DC

## 2017-01-07 MED ORDER — METOCLOPRAMIDE HCL 5 MG/ML IJ SOLN: 10.0000 mg | Freq: Once | INTRAVENOUS | Status: DC

## 2017-01-07 MED ORDER — DIVALPROEX SODIUM 125 MG PO CSDR: DELAYED_RELEASE_CAPSULE | ORAL | 6 refills | Status: DC

## 2017-01-07 MED ORDER — KETOROLAC TROMETHAMINE 60 MG/2ML IM SOLN: 60.0000 mg | Freq: Once | INTRAMUSCULAR | Status: AC

## 2017-01-07 NOTE — Progress Notes (Signed)
NEUROLOGY FOLLOW UP OFFICE NOTE  Dana Parks [id number] 1986-01-26  HISTORY OF PRESENT ILLNESS: I had the pleasure of seeing Dana Parks in follow-up in the neurology clinic on 01/07/2017.  The patient was last seen 2 years ago for evaluation of seizures while pregnant. At that time, she had reported her last seizure was at age 31, but there was also note from her visit with Dr. Lucia Gaskins in February that she had a seizure a week prior. They reported that it was not a seizure and isntead a migraine. She had been on Depakote for many years but stopped it when she found out she was pregnant. She was prescribed Keppra but only took it for a month because it was too strong. She delivered a healthy baby girl last year and continues to deny any seizures since age 31, and maybe age 76 or 31. She presents today for severe headaches that started 3 weeks ago. She reports her whole head is pounding. It would go away for a few minutes then come back several times during the day, affecting her vision with seeing double and difficulty seeing. She sees all the spots and dots. There is nausea, vomiting, and when she tries to get up she veers to the left and loses her balance. She has not been sleeping well due to the pain, waking up all night with her head pounding. She is also having neck pain, feeling tight up to the back of her head, like someone hit her on the back of her head. She reports the last big migraine was at age 31. She went to [facility] ER on 12/21/16 where she reported she felt like she was having a seizure. She had a head CT and MRI brain without contrast which I personally reviewed, no acute changes. 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 CBC, CMP, urinalysis normal. She was given Compazine, Tylenol, Keppra  1000mg , and IV fluids, and reported improvement in her symptoms. She was discharged home on Keppra 500mg  BID. She also has a prescription for Tizanidine which does help with neck pain. She reports headache returned the next day. She sometimes feels like she will pass out or like she is going into a fever. She denies any sick contacts.  HPI 02/25/2015: This is a 31 yo LH woman with a history of cerebral palsy, cognitive impairment, and seizures since childhood, who initially presented for management of seizures in pregnancy. She has only witnessed one seizure while she was at age 31, she became unresponsive then fell to the floor without convulsive activity. She felt funny when she came to. The patient lives with her stepmother, who denies any further seizures since then. They report that she started having seizures in infancy and was diagnosed with cerebral palsy. Seizures consisted of staring followed by convulsive activity, reported to be foaming at the mouth. She denies any olfactory/gustatory hallucinations, deja vu, rising epigastric sensation, focal numbness/tingling/weakness, myoclonic jerks. She has a history of migraines with aura ("seeing a lot of dots"), associated with dizziness. I asked the patient and her stepmother several times about last seizure, as she had been evaluated by neurologist Dr. Lucia Gaskins in February 2016, with report that she had a seizure the week prior to her clinic visit. She and her stepmother report that she did not have a seizure, instead she had a migraine and saw spots.  She was under a lot of stress at that time. She had been taking Depakote for many years, and per Dr. Trevor MaceAhern's notes, the patient self-discontinued Depakote when she found out she was pregnant in December 2015. She was prescribed Keppra which she only took that month because "it was too strong." The patient today reports that she did not stop Depakote and was instructed to stop it by her doctor. She reports she was  only on low dose Depakote 250mg  1 tablet daily with no seizures since age 31. She had also stopped the medication when she was pregnant with her now 32 year old daughter. She is due for delivery in 2 weeks. She and her stepmother deny any staring/unresponsive episodes, gaps in time, or other seizure-like symptoms off medication for the past 5 months.   She has intermittent headaches lasting 3 minutes or so, with associated nausea. She sometimes loses vision for a few minutes with the headaches, last episode was in February. She has occasional blurred vision, occasional numbness in both hands and feet. No dysarthria, dysphagia, neck pain, bowel/bladder dysfunction. She finished 12th grade in special ed classes.  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 Her uncle has seizures. She had febrile convulsions as a child. There is no history of CNS infections such as meningitis/encephalitis, significant traumatic brain injury, neurosurgical procedures  PAST MEDICAL HISTORY: Past Medical History:  Diagnosis Date  . Anxiety   . Asthma   . Back pain    unknown   . Cerebral palsy (HCC)   . Headache(784.0)    frequently  . Liver disease   . Low sodium diet   . Muscle spasm    takes Flexeril daily  . Neuromuscular disorder (HCC)    cerebral palsy  . Numbness    bilateral feet  . Peripheral edema    occasionally  . Seizure disorder (HCC)   . Seizures (HCC)    last one in high school;takes Depakote daily    MEDICATIONS: Current Outpatient Prescriptions on File Prior to Visit  Medication Sig Dispense Refill  . albuterol (PROVENTIL HFA;VENTOLIN HFA) 108 (90 BASE) MCG/ACT inhaler Inhale 2 puffs into the lungs every 6 (six) hours as needed for wheezing (wheezing). 1 Inhaler 5  . enalapril (VASOTEC) 20 MG tablet Take 1 tablet (20 mg total) by mouth daily. 30 tablet 1  . ferrous  sulfate 325 (65 FE) MG tablet Take 1 tablet (325 mg total) by mouth 2 (two) times daily with a meal. 60 tablet 3  . ibuprofen (ADVIL,MOTRIN) 800 MG tablet Take 800 mg by mouth every 6 (six) hours as needed for moderate pain.   0  . levETIRAcetam (KEPPRA) 500 MG tablet Take 500 mg by mouth 2 (two) times daily.    Marland Kitchen. levETIRAcetam (KEPPRA) 500 MG tablet Take 1 tablet (500 mg total) by mouth 2 (two) times daily. 60 tablet 0  . montelukast (SINGULAIR) 10 MG tablet Take 10 mg by mouth at bedtime.  0  . tiZANidine (ZANAFLEX) 4 MG tablet Take 4 mg by mouth at bedtime.    . hydrochlorothiazide (HYDRODIURIL) 25 MG tablet Take 1 tablet (25 mg total) by mouth daily. (Patient not taking: Reported on 12/21/2016) 30 tablet 3  . ibuprofen (ADVIL,MOTRIN) 600 MG tablet Take 1 tablet (600 mg total) by mouth every 6 (six) hours as needed for mild pain, moderate pain or cramping. (  Patient not taking: Reported on 12/21/2016) 30 tablet 0  . ondansetron (ZOFRAN) 4 MG tablet Take 1 tablet (4 mg total) by mouth every 6 (six) hours. (Patient not taking: Reported on 12/21/2016) 12 tablet 0  . oxyCODONE-acetaminophen (ROXICET) 5-325 MG per tablet Take 1 tablet by mouth every 6 (six) hours as needed for moderate pain or severe pain. (Patient not taking: Reported on 12/21/2016) 30 tablet 0   No current facility-administered medications on file prior to visit.     ALLERGIES: Allergies  Allergen Reactions  . Shrimp [Shellfish Allergy] Anaphylaxis    Can eat other shellfish  . Codeine Other (See Comments)    GI upset  . Dust Mite Extract Other (See Comments)    Unknown reaction  . Mold Extract [Trichophyton] Other (See Comments)    Unknown reaction  . Penicillins Other (See Comments)    Has patient had a PCN reaction causing immediate rash, facial/tongue/throat swelling, SOB or lightheadedness with hypotension: No Has patient had a PCN reaction causing severe rash involving mucus membranes or skin necrosis: No Has patient had  a PCN reaction that required hospitalization: Yes Has patient had a PCN reaction occurring within the last 10 years: No If all of the above answers are "NO", then may proceed with Cephalosporin use.  Unknown reaction; pt has gotten cefazolin  . Soap Other (See Comments)    Unknown reaction    FAMILY HISTORY: Family History  Problem Relation Age of Onset  . Diabetes Maternal Grandfather   . Hypertension Maternal Grandfather     SOCIAL HISTORY: Social History   Social History  . Marital status: Single    Spouse name: N/A  . Number of children: 1  . Years of education: high schoo   Occupational History  . Disabled    Social History Main Topics  . Smoking status: Former Smoker    Packs/day: 0.25    Years: 7.00    Types: Cigarettes    Quit date: 11/05/2014  . Smokeless tobacco: Never Used  . Alcohol use No  . Drug use: No  . Sexual activity: Yes    Partners: Male    Birth control/ protection: None   Other Topics Concern  . Not on file   Social History Narrative   Lives at home with her sister.   Single.    Has 2 children. Currently pregnant with 1.    REVIEW OF SYSTEMS: Constitutional: No fevers, chills, or sweats, no generalized fatigue, change in appetite Eyes: No visual changes, double vision, eye pain Ear, nose and throat: No hearing loss, ear pain, nasal congestion, sore throat Cardiovascular: No chest pain, palpitations Respiratory:  No shortness of breath at rest or with exertion, wheezes GastrointestinaI: + nausea, vomiting, no diarrhea, abdominal pain, fecal incontinence Genitourinary:  No dysuria, urinary retention or frequency Musculoskeletal:  + neck pain, back pain Integumentary: No rash, pruritus, skin lesions Neurological: as above Psychiatric: No depression, insomnia, anxiety Endocrine: No palpitations, fatigue, diaphoresis, mood swings, change in appetite, change in weight, increased thirst Hematologic/Lymphatic:  No anemia, purpura,  petechiae. Allergic/Immunologic: no itchy/runny eyes, nasal congestion, recent allergic reactions, rashes  PHYSICAL EXAM: Vitals:   01/07/17 1447  BP: 132/84  Pulse: (!) 53   General: No acute distress but appears uncomfortable Head:  Normocephalic/atraumatic Eyes: Fundoscopic exam shows bilateral sharp discs, no vessel changes, exudates, or hemorrhages Neck: supple, + paraspinal tenderness, full range of motion Back: No paraspinal tenderness Heart: regular rate and rhythm Lungs: Clear to auscultation bilaterally. Vascular:  No carotid bruits. Skin/Extremities: No rash, no edema Neurological Exam: Mental status: alert and oriented to person, place, and time, +mild dysarthria, no aphasia, Fund of knowledge is appropriate.  Recent and remote memory are intact.  Attention and concentration are normal.    Able to name objects and repeat phrases. Cranial nerves: CN I: not tested CN II: pupils equal, round and reactive to light, visual fields intact, fundi unremarkable. CN III, IV, VI:  full range of motion, no nystagmus, no ptosis CN V: facial sensation intact CN VII: upper and lower face symmetric CN VIII: hearing intact to finger rub CN IX, X: gag intact, uvula midline CN XI: sternocleidomastoid and trapezius muscles intact CN XII: tongue midline Bulk & Tone: increased in both LE, no fasciculations. Motor: 5/5 throughout except for right foot dorsiflexion 3+/5. No pronator drift. Sensation: intact to light touch, cold, pin, vibration and joint position sense.  No extinction to double simultaneous stimulation.  Romberg test negative Deep Tendon Reflexes: +2 on both UE, brisk +3 both LE, no ankle clonus Plantar responses: downgoing bilaterally Cerebellar: no incoordination on finger to nose, heel to shin. No dysdiadochokinesia Gait: slow, cautious, reports feeling very unsteady, not clearly ataxic Tremor: none  IMPRESSION: This is a 31 yo LH woman with a history of cerebral palsy,  migraines, seizures since childhood, none since age 52 or 6, presenting for 3 weeks of severe headaches. She was in the ER 2.5 weeks ago with unremarkable MRI brain. She continues to report continued headaches with migrainous features (visual changes of dots and spots, nausea/vomiting), suggestive of status migrainosus. Neurological exam is unchanged from her prior visit with history of CP, but she is unsteady with walking today, which she states occurs when the headaches are very bad. She will be given a migraine cocktail in the office today and a 1-week course of prednisone to hopefully break the headache cycle. She was previously on low dose Depakote 125mg  daily for migraine prophylaxis, which she stopped when she got pregnant in December 2015. We will restart Depakote and may need to uptitate dose if needed. She does report neck pain and pain on neck flexion, tizanidine helps. We discussed that if symptoms do not improve over the next 4-5 days or if symptoms worsen, would go to the ER, she may need a spinal tap. She will follow-up in 3-4 months and knows to call for any changes.   Thank you for allowing me to participate in her care.  Please do not hesitate to call for any questions or concerns.  The duration of this appointment visit was 25 minutes of face-to-face time with the patient.  Greater than 50% of this time was spent in counseling, explanation of diagnosis, planning of further management, and coordination of care.   Patrcia Dolly, M.D.   CC: Dr. Concepcion Elk

## 2017-01-07 NOTE — Patient Instructions (Signed)
1. Start Depakote sprinkles 125mg : Take 1 capsule at night 2. Take Prednisone 20mg : Take 3 tabs on day 1, 2-1/2 tabs on day 2, 2 tabs on day 3, 1-1/2 tabs on day 4, 1 tab on day 5, 1/2 tab on days 6 and 7, then stop. 3. Give migraine cocktail 4. If symptoms worsen or do not improve in 4-5 days, go to ER 5. Follow-up in 3-4 months, call for any changes

## 2017-01-08 DIAGNOSIS — G43111 Migraine with aura, intractable, with status migrainosus: Secondary | ICD-10-CM | POA: Insufficient documentation

## 2017-05-13 ENCOUNTER — Emergency Department (HOSPITAL_COMMUNITY): Payer: Medicare Other

## 2017-05-13 ENCOUNTER — Emergency Department (HOSPITAL_COMMUNITY)
Admission: EM | Admit: 2017-05-13 | Discharge: 2017-05-13 | Disposition: A | Discharge status: Home / Self Care | Payer: Medicare Other | Attending: Emergency Medicine | Admitting: Emergency Medicine

## 2017-05-13 ENCOUNTER — Encounter (HOSPITAL_COMMUNITY): Payer: Self-pay | Admitting: Emergency Medicine

## 2017-05-13 ENCOUNTER — Ambulatory Visit: Payer: Medicare Other | Admitting: Neurology

## 2017-05-13 DIAGNOSIS — Z79899 Other long term (current) drug therapy: Secondary | ICD-10-CM | POA: Insufficient documentation

## 2017-05-13 DIAGNOSIS — J45909 Unspecified asthma, uncomplicated: Secondary | ICD-10-CM | POA: Insufficient documentation

## 2017-05-13 DIAGNOSIS — M545 Low back pain, unspecified: Secondary | ICD-10-CM

## 2017-05-13 DIAGNOSIS — Z87891 Personal history of nicotine dependence: Secondary | ICD-10-CM | POA: Insufficient documentation

## 2017-05-13 DIAGNOSIS — M25512 Pain in left shoulder: Secondary | ICD-10-CM

## 2017-05-13 LAB — POC URINE PREG, ED: PREG TEST UR: NEGATIVE

## 2017-05-13 MED ORDER — HYDROCODONE-ACETAMINOPHEN 5-325 MG PO TABS
1.0000 | ORAL_TABLET | Freq: Once | ORAL | Status: AC
  Administered 2017-05-13: 1 via ORAL
  Filled 2017-05-13: qty 1

## 2017-05-13 MED ORDER — IBUPROFEN 800 MG PO TABS: 800.0000 mg | ORAL_TABLET | Freq: Three times a day (TID) | ORAL | 0 refills | Status: DC | PRN

## 2017-05-13 MED ORDER — CYCLOBENZAPRINE HCL 10 MG PO TABS
5.0000 mg | ORAL_TABLET | Freq: Once | ORAL | Status: AC
  Administered 2017-05-13: 5 mg via ORAL
  Filled 2017-05-13: qty 1

## 2017-05-13 NOTE — Discharge Instructions (Signed)
It was my pleasure taking care of you today!   Ibuprofen as needed for pain. Ice affected area for additional pain relief.  Follow up with your primary care provider if symptoms are not improving in the next week.  Return to ER for new or worsening symptoms, any additional concerns.
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
## 2017-05-29 ENCOUNTER — Ambulatory Visit (INDEPENDENT_AMBULATORY_CARE_PROVIDER_SITE_OTHER): Payer: Medicare Other | Admitting: Orthopedic Surgery

## 2017-06-21 ENCOUNTER — Telehealth: Payer: Self-pay | Admitting: Neurology

## 2017-06-21 NOTE — Telephone Encounter (Signed)
Patient called and is needing to speak with you regarding her headaches. She said the headaches have been really bad. Doctor Karel Jarvisquino is booked out until May. Please call. Thanks

## 2018-02-12 ENCOUNTER — Telehealth: Payer: Self-pay | Admitting: Neurology

## 2018-02-12 DIAGNOSIS — G43111 Migraine with aura, intractable, with status migrainosus: Secondary | ICD-10-CM

## 2018-02-12 NOTE — Telephone Encounter (Signed)
Dana [place] Bluitt Self [phone]   divalproex (DEPAKOTE SPRINKLES) 125 MG capsule   [facility] [address]  [name] called to see if she could get an appointment for tomorrow because she is out of her medicine and needs a refill, I scheduled an appointment for first available 2.28.20

## 2018-02-13 MED ORDER — DIVALPROEX SODIUM 125 MG PO CSDR: DELAYED_RELEASE_CAPSULE | ORAL | 6 refills | Status: DC

## 2018-02-13 NOTE — Telephone Encounter (Signed)
Rx sent to pharmacy listed below. 

## 2018-09-03 NOTE — Progress Notes (Signed)
[facility] - Clinic Note  09/08/2018     CHIEF COMPLAINT Patient presents for Retina Evaluation   HISTORY OF PRESENT ILLNESS: Dana Parks is a 33 y.o. female who presents to the clinic today for:   HPI    Retina Evaluation    In right eye.  This started 1 month ago.  Duration of 1 month.  Associated Symptoms Floaters and Pain.  Context:  distance vision, mid-range vision and near vision.  Treatments tried include no treatments.  I, the attending physician,  performed the HPI with the patient and updated documentation appropriately.          Comments    33 y/o female pt referred by Dr. Aura CampsMichael Spencer for eval of BRAO OD.  Saw Dr. Karleen HampshireSpencer about 1 month ago.  VA good OU, but VA OD is often blurred, and she feels like her right eye is strained a lot, giving her headaches, and making her eye ache some.  Denies flashes, but has a few small floaters OU.  No gtts.       Last edited by Rennis ChrisZamora, Cedrik Heindl, MD on 09/08/2018  2:16 PM. (History)      Referring physician: Aura CampsSpencer, Michael, MD [address] [place] [id number]  HISTORICAL INFORMATION:   Selected notes from the MEDICAL RECORD NUMBER Referred by Dr. Aura CampsMichael Spencer for concern of BRAO OD LEE: 01.14.20 (M. Spencer) [BCVA: OD: 20/30- OS: 20/25 Ocular Hx- PMH-anxiety, arthritis, cerebral palsy, headaches, seizures    CURRENT MEDICATIONS: No current outpatient medications on file. (Ophthalmic Drugs)   No current facility-administered medications for this visit.  (Ophthalmic Drugs)   Current Outpatient Medications (Other)  Medication Sig  . albuterol (PROVENTIL HFA;VENTOLIN HFA) 108 (90 BASE) MCG/ACT inhaler Inhale 2 puffs into the lungs every 6 (six) hours as needed for wheezing (wheezing).  Marland Kitchen. azithromycin (ZITHROMAX) 250 MG tablet   . benzonatate (TESSALON) 100 MG capsule TK 1 C PO Q 8 H PRF CNC  . divalproex (DEPAKOTE SPRINKLES) 125 MG capsule Take 1 capsule at bedtime  .  enalapril (VASOTEC) 20 MG tablet Take 1 tablet (20 mg total) by mouth daily.  . ferrous sulfate 325 (65 FE) MG tablet Take 1 tablet (325 mg total) by mouth 2 (two) times daily with a meal.  . fluticasone (FLONASE) 50 MCG/ACT nasal spray INSTILL 2 SPRAYS IEN QD  . hydrochlorothiazide (HYDRODIURIL) 25 MG tablet Take 1 tablet (25 mg total) by mouth daily.  Marland Kitchen. ibuprofen (ADVIL,MOTRIN) 800 MG tablet Take 1 tablet (800 mg total) by mouth every 8 (eight) hours as needed for moderate pain.  Marland Kitchen. levETIRAcetam (KEPPRA) 500 MG tablet Take 500 mg by mouth 2 (two) times daily.  Marland Kitchen. levETIRAcetam (KEPPRA) 500 MG tablet TK 1 T PO  BID  . loratadine (CLARITIN) 10 MG tablet TK 1 T PO  QD PRN  . montelukast (SINGULAIR) 10 MG tablet Take 10 mg by mouth at bedtime.  . ondansetron (ZOFRAN) 4 MG tablet Take 1 tablet (4 mg total) by mouth every 6 (six) hours.  Marland Kitchen. oxyCODONE-acetaminophen (ROXICET) 5-325 MG per tablet Take 1 tablet by mouth every 6 (six) hours as needed for moderate pain or severe pain.  . predniSONE (DELTASONE) 20 MG tablet Take 3 tabs on day 1, 2-1/2 tabs on day 2, 2 tabs on day 3, 1-1/2 tabs on day 4, 1 tab on day 5, 1/2 tab on days 6 and 7, then stop.  Marland Kitchen. tiZANidine (ZANAFLEX) 4 MG  tablet Take 4 mg by mouth at bedtime.  . Vitamin D, Ergocalciferol, (DRISDOL) 1.25 MG (50000 UT) CAPS capsule TK 1 C PO ONCE A WEEK  . levETIRAcetam (KEPPRA) 500 MG tablet Take 1 tablet (500 mg total) by mouth 2 (two) times daily.   Current Facility-Administered Medications (Other)  Medication Route  . diphenhydrAMINE (BENADRYL) injection 25 mg Intramuscular  . metoCLOPramide (REGLAN) 10 mg in dextrose 5 % 50 mL IVPB Intramuscular      REVIEW OF SYSTEMS: ROS    Positive for: Neurological, Eyes, Respiratory   Negative for: Constitutional, Gastrointestinal, Skin, Genitourinary, Musculoskeletal, HENT, Endocrine, Cardiovascular, Psychiatric, Allergic/Imm, Heme/Lymph   Last edited by Celine Mans, COA on 09/08/2018  1:57 PM.  (History)       ALLERGIES Allergies  Allergen Reactions  . Shrimp [Shellfish Allergy] Anaphylaxis    Can eat other shellfish  . Codeine Other (See Comments)    GI upset  . Dust Mite Extract Other (See Comments)    Unknown reaction  . Mold Extract [Trichophyton] Other (See Comments)    Unknown reaction  . Penicillins Other (See Comments)    Has patient had a PCN reaction causing immediate rash, facial/tongue/throat swelling, SOB or lightheadedness with hypotension: No Has patient had a PCN reaction causing severe rash involving mucus membranes or skin necrosis: No Has patient had a PCN reaction that required hospitalization: Yes Has patient had a PCN reaction occurring within the last 10 years: No If all of the above answers are "NO", then may proceed with Cephalosporin use.  Unknown reaction; pt has gotten cefazolin  . Soap Other (See Comments)    Unknown reaction    PAST MEDICAL HISTORY Past Medical History:  Diagnosis Date  . Anxiety   . Asthma   . Back pain    unknown   . Cerebral palsy (HCC)   . Headache(784.0)    frequently  . Liver disease   . Low sodium diet   . Muscle spasm    takes Flexeril daily  . Neuromuscular disorder (HCC)    cerebral palsy  . Numbness    bilateral feet  . Peripheral edema    occasionally  . Seizure disorder (HCC)   . Seizures (HCC)    last one in high school;takes Depakote daily   Past Surgical History:  Procedure Laterality Date  . CESAREAN SECTION  2007  . CESAREAN SECTION WITH BILATERAL TUBAL LIGATION Bilateral 03/03/2015   Procedure: CESAREAN SECTION WITH BILATERAL TUBAL LIGATION;  Surgeon: Willodean Rosenthal, MD;  Location: [facility];  Service: Obstetrics;  Laterality: Bilateral;  Requested 03/03/15 @ 3:30p  Ok per Cleo/Myra-TM  . LEG SURGERY Bilateral   . TOOTH EXTRACTION N/A 02/16/2013   Procedure: EXTRACTION 16, 17, 32;  Surgeon: Georgia Lopes, DDS;  Location: [facility] OR;  Service: Oral Surgery;  Laterality: N/A;     FAMILY HISTORY Family History  Problem Relation Age of Onset  . Diabetes Maternal Grandfather   . Hypertension Maternal Grandfather     SOCIAL HISTORY Social History   Tobacco Use  . Smoking status: Former Smoker    Packs/day: 0.25    Years: 7.00    Pack years: 1.75    Types: Cigarettes    Last attempt to quit: 11/05/2014    Years since quitting: 3.8  . Smokeless tobacco: Never Used  Substance Use Topics  . Alcohol use: No    Alcohol/week: 0.0 standard drinks  . Drug use: No  OPHTHALMIC EXAM:  Base Eye Exam    Visual Acuity (Snellen - Linear)      Right Left   Dist cc 20/30 -2 20/25 -   Dist ph cc NI NI   Correction:  Glasses       Tonometry (Tonopen, 1:59 PM)      Right Left   Pressure 19 21  Squeezing       Pupils      Dark Light Shape React APD   Right 5 4 Round Brisk None   Left 5 4 Round Brisk None       Visual Fields (Counting fingers)      Left Right    Full Full       Extraocular Movement      Right Left    Full, Ortho Full, Ortho       Neuro/Psych    Oriented x3:  Yes   Mood/Affect:  Normal       Dilation    Both eyes:  1.0% Mydriacyl, 2.5% Phenylephrine @ 1:59 PM        Slit Lamp and Fundus Exam    Slit Lamp Exam      Right Left   Lids/Lashes Normal Normal   Conjunctiva/Sclera White and quiet White and quiet   Cornea Clear Clear; 1+ PEE   Anterior Chamber deep; clear deep; clear   Iris Round, dilated Round, dilated   Lens 1+NSC; 1+CC 1+NSC; 1+CC   Vitreous +PVD +PVD       Fundus Exam      Right Left   Disc Compact, trace Pallor Compact, trace Pallor   C/D Ratio 0.5 0.5   Macula attached; mild ERM; RPE mottling Flat, Good foveal reflex   Vessels attenuated Vascular attenuation, Tortuous   Periphery chronic nasal detachment from 12-8 w/ scattered retinal holes throughout and +subretinal bands; focal detachments at 9 and 1030 with corresponding retinal holes. Patches of lattice degeneration throughout detached  portions of retina Focal RD from 1-2 oclock with retinal hole at 0130; focal detachment from 3-530 w/  3 IT tears; focal detachment from 0700-0730 w/ reitnal hole at 0730. Patches of lattice degeneraiton        Refraction    Wearing Rx      Sphere Cylinder Axis   Right -4.00 +1.50 127   Left -4.25 +0.75 061   Age:  23m   Type:  SVL       Manifest Refraction      Sphere Cylinder Axis Dist VA   Right -5.00 +1.50 130 20/30-   Left -4.25 +0.75 060 20/25-          IMAGING AND PROCEDURES  Imaging and Procedures for @TODAY @  OCT, Retina - OU - Both Eyes       Right Eye Quality was good. Central Foveal Thickness: 298. Progression has no prior data. Findings include epiretinal membrane, normal foveal contour, macular pucker, intraretinal fluid, subretinal fluid.   Left Eye Central Foveal Thickness: 280. Progression has no prior data. Findings include normal foveal contour, no SRF, no IRF.   Notes *Images captured and stored on drive  Diagnosis / Impression:  OD: fovea attached w/ NFP no IRF/SRF centrally; +SRF inf nasal macula OS: NFP, no IRF/SRF centrally   Clinical management:  See below  Abbreviations: NFP - Normal foveal profile. CME - cystoid macular edema. PED - pigment epithelial detachment. IRF - intraretinal fluid. SRF - subretinal fluid. EZ - ellipsoid zone. ERM - epiretinal membrane. ORA -  outer retinal atrophy. ORT - outer retinal tubulation. SRHM - subretinal hyper-reflective material        Color Fundus Photography Optos - OU - Both Eyes       Right Eye Progression has no prior data. Disc findings include normal observations. Macula : normal observations. Vessels : tortuous vessels, attenuated. Periphery : detachment, hole (Scattered chronic-looking detachments from 1200-0200, 0200-0600, 0900 and 1030, visible retinal holes at 0900 and 1030, +sub-retinal bands nasally).   Left Eye Progression has no prior data. Disc findings include normal observations.  Macula : normal observations. Vessels : tortuous vessels, attenuated. Periphery : hole, detachment (Temporal retinal detachment with holes at 0300 and 0400, 2 visible retinal holes at 0330 and 0400, mild lattice temporally).   Notes Images stored on drive;   Impression: OD: scattered retinal holes with corresponding SRF / focal RD -- greatest nasally OS: scattered retinal holes with corresponding SRF / focal RD                  ASSESSMENT/PLAN:    ICD-10-CM   1. 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 Retinal edema H35.81 OCT, Retina - OU - Both Eyes  4. Posterior vitreous detachment of both eyes H43.813   5. Nuclear sclerosis of both eyes H25.13   6. Myopia of both eyes with astigmatism H52.13    H52.203     1,2. Bilateral retinal detachments -- OD > OS - both with fovea on, OD with mild inf nasal macular involvement - BCVA 20/30 OD, 20/25 OS - pt subjectively reports vision issues as far back as "years ago" - both eyes suggestive of chronic detachments - OD with extensive nasal detachment with subretinal bands and early PVR - OS with more peripheral focal rhegmatogenous retinal detachments with some pigmented demarcation lines and limited posterior extension - discussed findings, prognosis and treatment options with pt and medical power of attorney (step mother) - recommend SBP + 25g PPV/membrane peel/PFO/EL/gas OD and laser retinopexy via LIO OS under general anesthesia - RBA of procedure discussed, questions answered - informed consent obtained and signed - case scheduled for Thursday, 09/11/2018 - will need medical clearance from PCP - f/u Friday am for POV1  3. Lattice degeneration w/ atrophic holes, both eyes - significant patches of lattice w/ atrophic holes related to extensive RDs OU as described above - discussed  findings, prognosis, and treatment options - will be addressed during surgery above  4. PVD OU  5. Early cataract OU - The symptoms of cataract, surgical options, and treatments and risks were discussed with patient. - discussed diagnosis and progression and likelihood of cataract progression OD following SBP/PPV OD - not yet visually significant - monitor for now  6. History of myopia w/ astigmatism OU - discussed increased incidence of RT/RD with myopia   Ophthalmic Meds Ordered this visit:  No orders of the defined types were placed in this encounter.     Return in about 4 days (around 09/12/2018) for POV.  There are no Patient Instructions on file for this visit.   Explained the diagnoses, plan, and follow up with the patient and they expressed understanding.  Patient expressed understanding of the importance of proper follow up care.   This document serves as a record of services personally performed by Karie Chimera, MD, PhD. It was created on their  behalf by Laurian BrimAmanda Brown, OA, an ophthalmic assistant. The creation of this record is the provider's dictation and/or activities during the visit.    Electronically signed by: Laurian BrimAmanda Brown, OA  01.29.2020 12:58 PM    Karie ChimeraBrian G. Payzlee Ryder, M.D., Ph.D. Diseases & Surgery of the Retina and Vitreous [facility]  I have reviewed the above documentation for accuracy and completeness, and I agree with the above. Karie ChimeraBrian G. Vikki Gains, M.D., Ph.D. 09/09/18 12:58 PM   Abbreviations: M myopia (nearsighted); A astigmatism; H hyperopia (farsighted); P presbyopia; Mrx spectacle prescription;  CTL contact lenses; OD right eye; OS left eye; OU both eyes  XT exotropia; ET esotropia; PEK punctate epithelial keratitis; PEE punctate epithelial erosions; DES dry eye syndrome; MGD meibomian gland dysfunction; ATs artificial tears; PFAT's preservative free artificial tears; NSC nuclear sclerotic cataract; PSC posterior subcapsular  cataract; ERM epi-retinal membrane; PVD posterior vitreous detachment; RD retinal detachment; DM diabetes mellitus; DR diabetic retinopathy; NPDR non-proliferative diabetic retinopathy; PDR proliferative diabetic retinopathy; CSME clinically significant macular edema; DME diabetic macular edema; dbh dot blot hemorrhages; CWS cotton wool spot; POAG primary open angle glaucoma; C/D cup-to-disc ratio; HVF humphrey visual field; GVF goldmann visual field; OCT optical coherence tomography; IOP intraocular pressure; BRVO Branch retinal vein occlusion; CRVO central retinal vein occlusion; CRAO central retinal artery occlusion; BRAO branch retinal artery occlusion; RT retinal tear; SB scleral buckle; PPV pars plana vitrectomy; VH Vitreous hemorrhage; PRP panretinal laser photocoagulation; IVK intravitreal kenalog; VMT vitreomacular traction; MH Macular hole;  NVD neovascularization of the disc; NVE neovascularization elsewhere; AREDS age related eye disease study; ARMD age related macular degeneration; POAG primary open angle glaucoma; EBMD epithelial/anterior basement membrane dystrophy; ACIOL anterior chamber intraocular lens; IOL intraocular lens; PCIOL posterior chamber intraocular lens; Phaco/IOL phacoemulsification with intraocular lens placement; PRK photorefractive keratectomy; LASIK laser assisted in situ keratomileusis; HTN hypertension; DM diabetes mellitus; COPD chronic obstructive pulmonary disease

## 2018-09-08 ENCOUNTER — Encounter (INDEPENDENT_AMBULATORY_CARE_PROVIDER_SITE_OTHER): Payer: Self-pay | Admitting: Ophthalmology

## 2018-09-08 ENCOUNTER — Ambulatory Visit (INDEPENDENT_AMBULATORY_CARE_PROVIDER_SITE_OTHER): Payer: Medicare Other | Admitting: Ophthalmology

## 2018-09-08 DIAGNOSIS — H3323 Serous retinal detachment, bilateral: Secondary | ICD-10-CM | POA: Diagnosis not present

## 2018-09-08 DIAGNOSIS — H35413 Lattice degeneration of retina, bilateral: Secondary | ICD-10-CM

## 2018-09-08 DIAGNOSIS — H2513 Age-related nuclear cataract, bilateral: Secondary | ICD-10-CM

## 2018-09-08 DIAGNOSIS — H43813 Vitreous degeneration, bilateral: Secondary | ICD-10-CM | POA: Diagnosis not present

## 2018-09-08 DIAGNOSIS — H5213 Myopia, bilateral: Secondary | ICD-10-CM

## 2018-09-08 DIAGNOSIS — H3581 Retinal edema: Secondary | ICD-10-CM

## 2018-09-08 DIAGNOSIS — H52203 Unspecified astigmatism, bilateral: Secondary | ICD-10-CM

## 2018-09-09 ENCOUNTER — Encounter (INDEPENDENT_AMBULATORY_CARE_PROVIDER_SITE_OTHER): Payer: Self-pay | Admitting: Ophthalmology

## 2018-09-09 NOTE — H&P (Signed)
Dana Parks is an 33 y.o. female.    Chief Complaint: retinal detachment, both eyes  HPI: Pt with history of cerebral palsy and seizure disorder presents with complaint of long standing, vague complaint of intermittent blurry vision, OU, OD>OS. On dilated exam, was noted to have chronic-appearing bilateral retinal detachments with multiple retinal tears/holes in both eyes.  Past Medical History:  Diagnosis Date  . Anxiety   . Asthma   . Back pain    unknown   . Cerebral palsy (HCC)   . Headache(784.0)    frequently  . Liver disease   . Low sodium diet   . Muscle spasm    takes Flexeril daily  . Neuromuscular disorder (HCC)    cerebral palsy  . Numbness    bilateral feet  . Peripheral edema    occasionally  . Seizure disorder (HCC)   . Seizures (HCC)    last one in high school;takes Depakote daily    Past Surgical History:  Procedure Laterality Date  . CESAREAN SECTION  2007  . CESAREAN SECTION WITH BILATERAL TUBAL LIGATION Bilateral 03/03/2015   Procedure: CESAREAN SECTION WITH BILATERAL TUBAL LIGATION;  Surgeon: Willodean Rosenthal, MD;  Location: [facility];  Service: Obstetrics;  Laterality: Bilateral;  Requested 03/03/15 @ 3:30p  Ok per Cleo/Myra-TM  . LEG SURGERY Bilateral   . TOOTH EXTRACTION N/A 02/16/2013   Procedure: EXTRACTION 16, 17, 32;  Surgeon: Georgia Lopes, DDS;  Location: [facility] OR;  Service: Oral Surgery;  Laterality: N/A;    Family History  Problem Relation Age of Onset  . Diabetes Maternal Grandfather   . Hypertension Maternal Grandfather    Social History:  reports that she quit smoking about 3 years ago. Her smoking use included cigarettes. She has a 1.75 pack-year smoking history. She has never used smokeless tobacco. She reports that she does not drink alcohol or use drugs.  Allergies:  Allergies  Allergen Reactions  . Shrimp [Shellfish Allergy] Anaphylaxis    Can eat other shellfish  . Codeine Other (See Comments)    GI upset  . Dust Mite  Extract Other (See Comments)    Unknown reaction  . Mold Extract [Trichophyton] Other (See Comments)    Unknown reaction  . Penicillins Other (See Comments)    Has patient had a PCN reaction causing immediate rash, facial/tongue/throat swelling, SOB or lightheadedness with hypotension: No Has patient had a PCN reaction causing severe rash involving mucus membranes or skin necrosis: No Has patient had a PCN reaction that required hospitalization: Yes Has patient had a PCN reaction occurring within the last 10 years: No If all of the above answers are "NO", then may proceed with Cephalosporin use.  Unknown reaction; pt has gotten cefazolin  . Soap Other (See Comments)    Unknown reaction    No medications prior to admission.    Review of systems otherwise negative  There were no vitals taken for this visit.  Physical exam: Mental status: oriented x3. Eyes: See eye exam associated with this date of surgery Ears, Nose, Throat: within normal limits Neck: Within Normal limits General: within normal limits Chest: Within normal limits Breast: deferred Heart: Within normal limits Abdomen: Within normal limits GU: deferred Extremities: within normal limits Skin: within normal limits  Assessment/Plan 1.  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 Laser indirect ophthalmoscopy for retinal detachment, left eye  and 2. SBP + PPV w/ endolaser and gas, right eye; under general anesthesia - Case scheduled for 09/11/2018 in [facility] OR 08  Karie Chimera, M.D., Ph.D. Vitreoretinal Surgeon [facility]

## 2018-09-10 ENCOUNTER — Encounter (HOSPITAL_COMMUNITY): Payer: Self-pay | Admitting: *Deleted

## 2018-09-10 ENCOUNTER — Other Ambulatory Visit: Payer: Self-pay

## 2018-09-10 NOTE — Progress Notes (Signed)
SDW- Pre-op call completed by both pt and POA and step-mother, [name] ( on speaker phone) per pt request. Pt denies SOB, chest pain, and being under the care of a cardiologist. Pt denies having a stress test, echo and cardiac cath. Pt denies having a chest x ray and EKG within the last year. Pt denies recent labs. Pattricia Boss stated that pt is followed by Dr. Concepcion Elk, PCP and Dr. Karel Jarvis, Neurologist. Pt stated that last seizure was last year however, Pattricia Boss stated " last seizure was in high school." Pt stated that she has HTN but does not have the medication " I don't know what kind it is." Pt made aware to stop taking Aspirin (unless advised otherwise by surgeon), vitamins, fish oil and herbal medications. Do not take any NSAIDs ie: Ibuprofen, Advil, Naproxen (Aleve), Motrin, BC and Goody Powder or any medication containing Aspirin. Both pt and Pattricia Boss,  verbalized understanding of all pre-op instructions.

## 2018-09-11 ENCOUNTER — Encounter (HOSPITAL_COMMUNITY)
Admission: RE | Disposition: A | Discharge status: Home / Self Care | Payer: Self-pay | Source: Home / Self Care | Attending: Ophthalmology

## 2018-09-11 ENCOUNTER — Ambulatory Visit (HOSPITAL_COMMUNITY)
Admission: RE | Admit: 2018-09-11 | Discharge: 2018-09-11 | Disposition: A | Discharge status: Home / Self Care | Payer: Medicare Other | Attending: Ophthalmology | Admitting: Ophthalmology

## 2018-09-11 ENCOUNTER — Ambulatory Visit (HOSPITAL_COMMUNITY): Payer: Medicare Other | Admitting: Certified Registered Nurse Anesthetist

## 2018-09-11 DIAGNOSIS — J45909 Unspecified asthma, uncomplicated: Secondary | ICD-10-CM | POA: Diagnosis not present

## 2018-09-11 DIAGNOSIS — Z91013 Allergy to seafood: Secondary | ICD-10-CM | POA: Insufficient documentation

## 2018-09-11 DIAGNOSIS — I10 Essential (primary) hypertension: Secondary | ICD-10-CM | POA: Diagnosis not present

## 2018-09-11 DIAGNOSIS — G809 Cerebral palsy, unspecified: Secondary | ICD-10-CM | POA: Insufficient documentation

## 2018-09-11 DIAGNOSIS — Z885 Allergy status to narcotic agent status: Secondary | ICD-10-CM | POA: Insufficient documentation

## 2018-09-11 DIAGNOSIS — H33023 Retinal detachment with multiple breaks, bilateral: Secondary | ICD-10-CM | POA: Insufficient documentation

## 2018-09-11 DIAGNOSIS — G40909 Epilepsy, unspecified, not intractable, without status epilepticus: Secondary | ICD-10-CM | POA: Diagnosis not present

## 2018-09-11 DIAGNOSIS — Z87891 Personal history of nicotine dependence: Secondary | ICD-10-CM | POA: Insufficient documentation

## 2018-09-11 DIAGNOSIS — Z880 Allergy status to penicillin: Secondary | ICD-10-CM | POA: Diagnosis not present

## 2018-09-11 DIAGNOSIS — H338 Other retinal detachments: Secondary | ICD-10-CM | POA: Diagnosis not present

## 2018-09-11 DIAGNOSIS — H3322 Serous retinal detachment, left eye: Secondary | ICD-10-CM | POA: Diagnosis present

## 2018-09-11 HISTORY — PX: SCLERAL BUCKLE: SHX5340

## 2018-09-11 HISTORY — PX: GAS/FLUID EXCHANGE: SHX5334

## 2018-09-11 HISTORY — PX: LASER PHOTO ABLATION: SHX5942

## 2018-09-11 HISTORY — DX: Other specified postprocedural states: Z98.890

## 2018-09-11 HISTORY — DX: Essential (primary) hypertension: I10

## 2018-09-11 HISTORY — PX: PERFLUORONE INJECTION: SHX5302

## 2018-09-11 HISTORY — PX: PARS PLANA VITRECTOMY: SHX2166

## 2018-09-11 HISTORY — DX: Presence of spectacles and contact lenses: Z97.3

## 2018-09-11 HISTORY — DX: Other specified postprocedural states: R11.2

## 2018-09-11 HISTORY — DX: Unspecified retinal detachment with retinal break, bilateral: H33.003

## 2018-09-11 HISTORY — PX: PHOTOCOAGULATION WITH LASER: SHX6027

## 2018-09-11 LAB — CBC
HCT: 31 % — ABNORMAL LOW (ref 36.0–46.0)
HEMOGLOBIN: 8.3 g/dL — AB (ref 12.0–15.0)
MCH: 19.4 pg — AB (ref 26.0–34.0)
MCHC: 26.8 g/dL — ABNORMAL LOW (ref 30.0–36.0)
MCV: 72.6 fL — ABNORMAL LOW (ref 80.0–100.0)
Platelets: 420 10*3/uL — ABNORMAL HIGH (ref 150–400)
RBC: 4.27 MIL/uL (ref 3.87–5.11)
RDW: 17.4 % — ABNORMAL HIGH (ref 11.5–15.5)
WBC: 3.5 10*3/uL — ABNORMAL LOW (ref 4.0–10.5)
nRBC: 0 % (ref 0.0–0.2)

## 2018-09-11 LAB — BASIC METABOLIC PANEL
Anion gap: 14 (ref 5–15)
BUN: 11 mg/dL (ref 6–20)
CALCIUM: 9.2 mg/dL (ref 8.9–10.3)
CHLORIDE: 106 mmol/L (ref 98–111)
CO2: 19 mmol/L — ABNORMAL LOW (ref 22–32)
Creatinine, Ser: 0.71 mg/dL (ref 0.44–1.00)
GFR calc Af Amer: >60 mL/min (ref >60)
GFR calc non Af Amer: >60 mL/min (ref >60)
Glucose, Bld: 86 mg/dL (ref 70–99)
Potassium: 4.1 mmol/L (ref 3.5–5.1)
Sodium: 139 mmol/L (ref 135–145)

## 2018-09-11 LAB — HCG, SERUM, QUALITATIVE: Preg, Serum: NEGATIVE

## 2018-09-11 SURGERY — PARS PLANA VITRECTOMY WITH 25 GAUGE
Anesthesia: General | Site: Eye | Laterality: Right

## 2018-09-11 MED ORDER — BSS IO SOLN
INTRAOCULAR | Status: DC | PRN
  Administered 2018-09-11: 15 mL

## 2018-09-11 MED ORDER — NA CHONDROIT SULF-NA HYALURON 40-30 MG/ML IO SOLN
INTRAOCULAR | Status: DC | PRN
  Administered 2018-09-11: 0.5 mL via INTRAOCULAR

## 2018-09-11 MED ORDER — FENTANYL CITRATE (PF) 250 MCG/5ML IJ SOLN
INTRAMUSCULAR | Status: AC
  Filled 2018-09-11: qty 5

## 2018-09-11 MED ORDER — ATROPINE SULFATE 1 % OP SOLN
1.0000 [drp] | OPHTHALMIC | Status: AC | PRN
  Administered 2018-09-11 (×3): 1 [drp] via OPHTHALMIC
  Filled 2018-09-11: qty 2

## 2018-09-11 MED ORDER — TRIAMCINOLONE ACETONIDE 40 MG/ML IJ SUSP
INTRAMUSCULAR | Status: DC | PRN
  Administered 2018-09-11: 40 mg

## 2018-09-11 MED ORDER — DEXAMETHASONE SODIUM PHOSPHATE 10 MG/ML IJ SOLN
INTRAMUSCULAR | Status: AC
  Filled 2018-09-11: qty 1

## 2018-09-11 MED ORDER — BRIMONIDINE TARTRATE 0.2 % OP SOLN
OPHTHALMIC | Status: AC
  Filled 2018-09-11: qty 5

## 2018-09-11 MED ORDER — ATROPINE SULFATE 1 % OP SOLN
OPHTHALMIC | Status: DC | PRN
  Administered 2018-09-11: 1 [drp] via OPHTHALMIC

## 2018-09-11 MED ORDER — ROCURONIUM BROMIDE 50 MG/5ML IV SOSY
PREFILLED_SYRINGE | INTRAVENOUS | Status: DC | PRN
  Administered 2018-09-11 (×2): 50 mg via INTRAVENOUS

## 2018-09-11 MED ORDER — GATIFLOXACIN 0.5 % OP SOLN
OPHTHALMIC | Status: AC
  Filled 2018-09-11: qty 2.5

## 2018-09-11 MED ORDER — MIDAZOLAM HCL 2 MG/2ML IJ SOLN
INTRAMUSCULAR | Status: AC
  Filled 2018-09-11: qty 2

## 2018-09-11 MED ORDER — PREDNISOLONE ACETATE 1 % OP SUSP
OPHTHALMIC | Status: AC
  Filled 2018-09-11: qty 5

## 2018-09-11 MED ORDER — PHENYLEPHRINE HCL 10 MG/ML IJ SOLN
INTRAMUSCULAR | Status: AC
  Filled 2018-09-11: qty 1

## 2018-09-11 MED ORDER — GATIFLOXACIN 0.5 % OP SOLN OPTIME - NO CHARGE
OPHTHALMIC | Status: DC | PRN
  Administered 2018-09-11: 1 [drp] via OPHTHALMIC

## 2018-09-11 MED ORDER — FENTANYL CITRATE (PF) 250 MCG/5ML IJ SOLN
INTRAMUSCULAR | Status: DC | PRN
  Administered 2018-09-11: 50 ug via INTRAVENOUS
  Administered 2018-09-11 (×2): 100 ug via INTRAVENOUS

## 2018-09-11 MED ORDER — MIDAZOLAM HCL 2 MG/2ML IJ SOLN
INTRAMUSCULAR | Status: DC | PRN
  Administered 2018-09-11: 2 mg via INTRAVENOUS

## 2018-09-11 MED ORDER — PREDNISOLONE ACETATE 1 % OP SUSP
OPHTHALMIC | Status: DC | PRN
  Administered 2018-09-11: 1 [drp] via OPHTHALMIC

## 2018-09-11 MED ORDER — ATROPINE SULFATE 1 % OP SOLN
OPHTHALMIC | Status: AC
  Filled 2018-09-11: qty 5

## 2018-09-11 MED ORDER — DIPHENHYDRAMINE HCL 50 MG/ML IJ SOLN
INTRAMUSCULAR | Status: AC
  Filled 2018-09-11: qty 1

## 2018-09-11 MED ORDER — ONDANSETRON HCL 4 MG/2ML IJ SOLN
INTRAMUSCULAR | Status: AC
  Filled 2018-09-11: qty 2

## 2018-09-11 MED ORDER — SCOPOLAMINE 1 MG/3DAYS TD PT72
MEDICATED_PATCH | TRANSDERMAL | Status: DC | PRN
  Administered 2018-09-11: 1 via TRANSDERMAL

## 2018-09-11 MED ORDER — 0.9 % SODIUM CHLORIDE (POUR BTL) OPTIME
TOPICAL | Status: DC | PRN
  Administered 2018-09-11: 1000 mL

## 2018-09-11 MED ORDER — DEXAMETHASONE SODIUM PHOSPHATE 10 MG/ML IJ SOLN
INTRAMUSCULAR | Status: DC | PRN
  Administered 2018-09-11: 5 mg via INTRAVENOUS

## 2018-09-11 MED ORDER — EPINEPHRINE PF 1 MG/ML IJ SOLN
INTRAMUSCULAR | Status: AC
  Filled 2018-09-11: qty 1

## 2018-09-11 MED ORDER — STERILE WATER FOR IRRIGATION IR SOLN
Status: DC | PRN
  Administered 2018-09-11: 1000 mL

## 2018-09-11 MED ORDER — PHENYLEPHRINE HCL 10 MG/ML IJ SOLN
INTRAMUSCULAR | Status: DC | PRN
  Administered 2018-09-11 (×2): 80 ug via INTRAVENOUS

## 2018-09-11 MED ORDER — SCOPOLAMINE 1 MG/3DAYS TD PT72
MEDICATED_PATCH | TRANSDERMAL | Status: AC
  Filled 2018-09-11: qty 1

## 2018-09-11 MED ORDER — BUPIVACAINE HCL (PF) 0.75 % IJ SOLN
INTRAMUSCULAR | Status: AC
  Filled 2018-09-11: qty 10

## 2018-09-11 MED ORDER — NA CHONDROIT SULF-NA HYALURON 40-30 MG/ML IO SOLN
INTRAOCULAR | Status: AC
  Filled 2018-09-11: qty 1

## 2018-09-11 MED ORDER — SODIUM CHLORIDE 0.9 % IV SOLN
INTRAVENOUS | Status: DC
  Administered 2018-09-11 (×3): via INTRAVENOUS

## 2018-09-11 MED ORDER — LIDOCAINE HCL (PF) 2 % IJ SOLN
INTRAMUSCULAR | Status: AC
  Filled 2018-09-11: qty 10

## 2018-09-11 MED ORDER — PHENYLEPHRINE 40 MCG/ML (10ML) SYRINGE FOR IV PUSH (FOR BLOOD PRESSURE SUPPORT)
PREFILLED_SYRINGE | INTRAVENOUS | Status: AC
  Filled 2018-09-11: qty 10

## 2018-09-11 MED ORDER — INDOCYANINE GREEN 25 MG IV SOLR
INTRAVENOUS | Status: AC
  Filled 2018-09-11: qty 25

## 2018-09-11 MED ORDER — PROPARACAINE HCL 0.5 % OP SOLN
1.0000 [drp] | OPHTHALMIC | Status: AC | PRN
  Administered 2018-09-11 (×3): 1 [drp] via OPHTHALMIC
  Filled 2018-09-11: qty 15

## 2018-09-11 MED ORDER — DIPHENHYDRAMINE HCL 50 MG/ML IJ SOLN
INTRAMUSCULAR | Status: DC | PRN
  Administered 2018-09-11: 6.25 mg via INTRAVENOUS

## 2018-09-11 MED ORDER — CEFTAZIDIME 1 G IJ SOLR
INTRAMUSCULAR | Status: AC
  Filled 2018-09-11: qty 1

## 2018-09-11 MED ORDER — ROCURONIUM BROMIDE 50 MG/5ML IV SOSY
PREFILLED_SYRINGE | INTRAVENOUS | Status: AC
  Filled 2018-09-11: qty 10

## 2018-09-11 MED ORDER — STERILE WATER FOR INJECTION IJ SOLN
INTRAMUSCULAR | Status: AC
  Filled 2018-09-11: qty 10

## 2018-09-11 MED ORDER — BSS PLUS IO SOLN
INTRAOCULAR | Status: AC
  Filled 2018-09-11: qty 1000

## 2018-09-11 MED ORDER — EPINEPHRINE PF 1 MG/ML IJ SOLN
INTRAOCULAR | Status: DC | PRN
  Administered 2018-09-11: 500 mL

## 2018-09-11 MED ORDER — CARBACHOL 0.01 % IO SOLN
INTRAOCULAR | Status: AC
  Filled 2018-09-11: qty 1.5

## 2018-09-11 MED ORDER — FENTANYL CITRATE (PF) 100 MCG/2ML IJ SOLN: 25.0000 ug | INTRAMUSCULAR | Status: DC | PRN

## 2018-09-11 MED ORDER — HYDROCODONE-ACETAMINOPHEN 5-325 MG PO TABS: 1.0000 | ORAL_TABLET | ORAL | 0 refills | Status: AC | PRN

## 2018-09-11 MED ORDER — SODIUM CHLORIDE (PF) 0.9 % IJ SOLN
INTRAMUSCULAR | Status: AC
  Filled 2018-09-11: qty 10

## 2018-09-11 MED ORDER — PROPOFOL 10 MG/ML IV BOLUS
INTRAVENOUS | Status: AC
  Filled 2018-09-11: qty 20

## 2018-09-11 MED ORDER — LIDOCAINE 2% (20 MG/ML) 5 ML SYRINGE
INTRAMUSCULAR | Status: AC
  Filled 2018-09-11: qty 5

## 2018-09-11 MED ORDER — SODIUM CHLORIDE 0.9 % IV SOLN
INTRAVENOUS | Status: DC | PRN
  Administered 2018-09-11: 25 ug/min via INTRAVENOUS

## 2018-09-11 MED ORDER — ONDANSETRON HCL 4 MG/2ML IJ SOLN
INTRAMUSCULAR | Status: AC
  Filled 2018-09-11: qty 4

## 2018-09-11 MED ORDER — BRIMONIDINE TARTRATE 0.2 % OP SOLN
OPHTHALMIC | Status: DC | PRN
  Administered 2018-09-11: 1 [drp] via OPHTHALMIC

## 2018-09-11 MED ORDER — BSS IO SOLN
INTRAOCULAR | Status: AC
  Filled 2018-09-11: qty 15

## 2018-09-11 MED ORDER — TRIAMCINOLONE ACETONIDE 40 MG/ML IJ SUSP
INTRAMUSCULAR | Status: AC
  Filled 2018-09-11: qty 5

## 2018-09-11 MED ORDER — DORZOLAMIDE HCL-TIMOLOL MAL 2-0.5 % OP SOLN
OPHTHALMIC | Status: AC
  Filled 2018-09-11: qty 10

## 2018-09-11 MED ORDER — PROPOFOL 10 MG/ML IV BOLUS
INTRAVENOUS | Status: DC | PRN
  Administered 2018-09-11: 170 mg via INTRAVENOUS

## 2018-09-11 MED ORDER — LIDOCAINE 2% (20 MG/ML) 5 ML SYRINGE
INTRAMUSCULAR | Status: DC | PRN
  Administered 2018-09-11: 60 mg via INTRAVENOUS

## 2018-09-11 MED ORDER — DORZOLAMIDE HCL-TIMOLOL MAL 2-0.5 % OP SOLN
OPHTHALMIC | Status: DC | PRN
  Administered 2018-09-11: 1 [drp] via OPHTHALMIC

## 2018-09-11 MED ORDER — PHENYLEPHRINE HCL 10 % OP SOLN
1.0000 [drp] | OPHTHALMIC | Status: AC | PRN
  Administered 2018-09-11 (×3): 1 [drp] via OPHTHALMIC
  Filled 2018-09-11: qty 5

## 2018-09-11 MED ORDER — ONDANSETRON HCL 4 MG/2ML IJ SOLN
INTRAMUSCULAR | Status: DC | PRN
  Administered 2018-09-11 (×2): 4 mg via INTRAVENOUS

## 2018-09-11 MED ORDER — SUGAMMADEX SODIUM 200 MG/2ML IV SOLN
INTRAVENOUS | Status: DC | PRN
  Administered 2018-09-11: 150 mg via INTRAVENOUS

## 2018-09-11 MED ORDER — ERYTHROMYCIN 5 MG/GM OP OINT
TOPICAL_OINTMENT | OPHTHALMIC | Status: DC | PRN
  Administered 2018-09-11: 1 via OPHTHALMIC

## 2018-09-11 MED ORDER — TROPICAMIDE 1 % OP SOLN
1.0000 [drp] | OPHTHALMIC | Status: AC | PRN
  Administered 2018-09-11 (×3): 1 [drp] via OPHTHALMIC
  Filled 2018-09-11: qty 15

## 2018-09-11 MED ORDER — STERILE WATER FOR INJECTION IJ SOLN
INTRAMUSCULAR | Status: DC | PRN
  Administered 2018-09-11: 20 mL

## 2018-09-11 MED ORDER — POLYMYXIN B SULFATE 500000 UNITS IJ SOLR
INTRAMUSCULAR | Status: AC
  Filled 2018-09-11: qty 500000

## 2018-09-11 MED ORDER — LIDOCAINE HCL 2 % IJ SOLN
INTRAMUSCULAR | Status: DC | PRN
  Administered 2018-09-11: 10 mL via RETROBULBAR

## 2018-09-11 MED ORDER — ALBUMIN HUMAN 5 % IV SOLN
INTRAVENOUS | Status: DC | PRN
  Administered 2018-09-11: 15:00:00 via INTRAVENOUS

## 2018-09-11 SURGICAL SUPPLY — 51 items
APL SWBSTK 6 STRL LF DISP (MISCELLANEOUS) ×8
APPLICATOR COTTON TIP 6 STRL (MISCELLANEOUS) ×8 IMPLANT
APPLICATOR COTTON TIP 6IN STRL (MISCELLANEOUS) ×16
BAND SCLERAL BUCKLING TYPE 42 (Ophthalmic Related) ×2 IMPLANT
BANDAGE EYE OVAL (MISCELLANEOUS) ×4 IMPLANT
BETADINE 5% OPHTHALMIC (OPHTHALMIC) ×4 IMPLANT
CANNULA FLEX TIP 25G (CANNULA) ×4 IMPLANT
COVER SURGICAL LIGHT HANDLE (MISCELLANEOUS) ×4 IMPLANT
COVER WAND RF STERILE (DRAPES) ×4 IMPLANT
DRAPE MICROSCOPE LEICA 46X105 (MISCELLANEOUS) ×4 IMPLANT
FILTER BLUE MILLIPORE (MISCELLANEOUS) ×6 IMPLANT
FORCEPS GRIESHABER ILM 25G A (INSTRUMENTS) ×2 IMPLANT
GAS AUTO FILL CONSTEL (OPHTHALMIC) ×4
GAS AUTO FILL CONSTELLATION (OPHTHALMIC) IMPLANT
GLOVE BIO SURGEON STRL SZ7.5 (GLOVE) ×8 IMPLANT
GLOVE BIOGEL M 7.0 STRL (GLOVE) ×4 IMPLANT
GOWN STRL REUS W/ TWL LRG LVL3 (GOWN DISPOSABLE) ×4 IMPLANT
GOWN STRL REUS W/ TWL XL LVL3 (GOWN DISPOSABLE) ×2 IMPLANT
GOWN STRL REUS W/TWL LRG LVL3 (GOWN DISPOSABLE) ×8
GOWN STRL REUS W/TWL XL LVL3 (GOWN DISPOSABLE) ×4
KIT BASIN OR (CUSTOM PROCEDURE TRAY) ×4 IMPLANT
KIT PERFLUORON PROCEDURE 5ML (MISCELLANEOUS) ×2 IMPLANT
KNIFE GRIESHABER SHARP 2.5MM (MISCELLANEOUS) ×4 IMPLANT
NDL 18GX1X1/2 (RX/OR ONLY) (NEEDLE) ×2 IMPLANT
NDL 25GX 5/8IN NON SAFETY (NEEDLE) ×8 IMPLANT
NDL HYPO 30X.5 LL (NEEDLE) ×4 IMPLANT
NEEDLE 18GX1X1/2 (RX/OR ONLY) (NEEDLE) ×4 IMPLANT
NEEDLE 25GX 5/8IN NON SAFETY (NEEDLE) ×16 IMPLANT
NEEDLE HYPO 30X.5 LL (NEEDLE) ×8 IMPLANT
NS IRRIG 1000ML POUR BTL (IV SOLUTION) ×4 IMPLANT
OPHTHALMIC BETADINE 5% (OPHTHALMIC) ×8
PACK VITRECTOMY CUSTOM (CUSTOM PROCEDURE TRAY) ×4 IMPLANT
PAD ARMBOARD 7.5X6 YLW CONV (MISCELLANEOUS) ×8 IMPLANT
PAK PIK VITRECTOMY CVS 25GA (OPHTHALMIC) ×4 IMPLANT
PROBE ENDO DIATHERMY 25G (MISCELLANEOUS) ×2 IMPLANT
PROBE LASER ILLUM FLEX CVD 25G (OPHTHALMIC) ×2 IMPLANT
SUT ETHILON 5.0 S-24 (SUTURE) ×4 IMPLANT
SUT ETHILON 9 0 TG140 8 (SUTURE) IMPLANT
SUT SILK 2 0 (SUTURE) ×4
SUT SILK 2 0 TIES 17X18 (SUTURE) ×4
SUT SILK 2-0 18XBRD TIE 12 (SUTURE) ×2 IMPLANT
SUT SILK 2-0 18XBRD TIE BLK (SUTURE) ×2 IMPLANT
SUT VICRYL 7 0 TG140 8 (SUTURE) ×4 IMPLANT
SYR 10ML LL (SYRINGE) ×4 IMPLANT
SYR 20CC LL (SYRINGE) ×4 IMPLANT
SYR 5ML LL (SYRINGE) ×4 IMPLANT
SYR BULB 3OZ (MISCELLANEOUS) ×4 IMPLANT
SYR TB 1ML LUER SLIP (SYRINGE) ×4 IMPLANT
TOWEL NATURAL 6PK STERILE (DISPOSABLE) ×4 IMPLANT
TRAY FOLEY CATH 14FR (SET/KITS/TRAYS/PACK) ×4 IMPLANT
WATER STERILE IRR 1000ML POUR (IV SOLUTION) ×4 IMPLANT

## 2018-09-11 NOTE — Op Note (Signed)
Date of procedure:  09/11/2018   Surgeon: Bernarda Caffey, MD, PhD   Assistant: Ernest Mallick, OA   Pre-operative Diagnoses:  Rhegmatogenous retinal detachments, BOTH EYES  Post-operative diagnosis:  same   Anesthesia: General   Procedure:   1. Laser retinopexy via indirect ophthalmoscopy, LEFT EYE 2. Scleral buckle procedure, RIGHT EYE 3. 25 gauge pars plana vitrectomy, Right Eye 4. Preretinal membrane peel, Right Eye 5. Perfluoron injection, Right Eye 6. Endolaser, Right Eye 7. 14% C3F8 Gas Injection, Right Eye    Indications for procedure: This is a 33 yo F with a chronic history of decreased vision in both eyes and on exam was found to have rhegmatogenous retinal detachments in both eyes--OD > OS. Left eye retinal detachments were focal and peripheral, while the right eye detachments extended more posteriorly to the disc. After discussing the risks, benefits, and alternatives to surgery, the patient electively decided to proceed with surgery and informed consent was obtained. The surgery was an attempt to barricade the peripheral retinal detachments in the right eye and reattach the retina in the left eye to improve the vision within the reasonable expectations of the surgeon.    Procedure in Detail:    The patient was met in the pre-operative holding area where their identification data was verified.  It was noted that there was a signed, informed consent in the chart and both eyes were verbally verified by the patient--left eye for laser and right eye for surgery. Then the operative eye and was marked with the surgeon's initials with a marking pen and the left eye was marked with word "laser." The patient was then taken to the operating room and placed in the supine position. General endotracheal anesthesia was induced.   Attention was first turned to the indirect laser ophthalmoscopy portion of the procedure. Barricade laser retinopexy was performed 360 degrees to the peripheral retina  of the LEFT EYE and posterior to each focal, peripheral retinal detachment. There were retinal holes with surrounding SRF at 130, 3-530, 7-730, and 9-11 oclock. Several of these focal detachments had pigmented demarcation lines indicating some degree of chronicity. Using the laser indirect ophthalmoscope, 1347 spots were placed at 180-200 mW power, 150 ms duration. Following completion of laser retinopexy, lubricating ophthalmic ointment was applied to the left eye and the eyelid was taped closed.   The RIGHT EYE was then prepped with 5% betadine and draped in the normal fashion for ophthalmic surgery and a secondary time-out was performed to identify the correct patient, eyes, procedures, and any allergies.   To begin the scleral buckle portion of the surgery, a 360 conjunctival peritomy was created using Westcott scissors and 0.12 forceps. Each of the four quadrants between the rectus muscles was dissected using Stevens scissors to detach Tenon's attachments from the globe. Each of the four rectus muscles was isolated on a muscle hook and slung using 2-0 Silk suture in the usual standard fashion. Each of the four quadrants between the rectus muscles was inspected and there were noted to be no areas of scleral thinning. A #80 silicone band was then brought onto the field and was threaded under each rectus muscle. The band was then loosely secured using a #70 Watzke sleeve in the inferotemporal quadrant The band was then sutured to the sclera in each quadrant using 5-0 nylon sutures passed partial thickness through the sclera in a horizontal mattress fashion. The scleral buckle was then tightened to the appropriate height with two locking needle drivers.  Attention was then turned to the vitrectomy portion of the operation. The microscope was draped and swung into position. A 25 gauge trocar was inserted in a 30-45 degrees fashion into the inferotemporal quadrant 4 mm posterior to the limbus in this phakic  patient. Correct positioning within the vitreous was verified externally with the light pipe. The infusion was then connected to the cannula and BSS infusion was commenced.  Additional ports were placed in the superonasal and superotemporal quadrants. Viscoat was placed on the cornea. The BIOM was used to visualize the posterior segment. The patient had a large nasal and inferior retinal detachment extending from 1230 to 9 oclock with retinal holes throughout, and a focal retinal detachment at 1030 with a large retinal hole. A core vitrectomy was performed using the BIOM visualization system, vitrectomy probe and light pipe. Kenalog was used to mark the vitreous and assist in dissecting vitreous membranes.      Kenalog was also used to mark cortical vitreous and preretinal membranes in the nasal and inferior quadrants. End-grasping ILM forceps and the vitrectomy probe were used to carefully peel and dissect cortical vitreous and preretinal membranes off the entire surface of the retina as was deemed safe.  Traction was removed from all retinal breaks. The breaks were trimmed using the cutter to smooth the edges. Perfluoron was used to flatten the retina. An endolaser probe was brought onto the field and was used to apply photocoagulation around all retinal breaks and 360 degrees over and posterior to the scleral buckle.  A complete fluid-air exchange was performed with a soft tip extrusion cannula over the retinal breaks. After completion of these maneuvers, the posterior pole and peripheral retina were noted to be flat. Under air, additional endolaser was applied to the anterior peripheral retina.  At this time, the buckle height was confirmed and the buckle was finalized by trimming the band ends. The superotemporal trocar was removed and sutured with 7-0 vicryl in an interrupted fashion. A complete air to 14% C3F8 gas exchange was performed through the infusion cannula and vented through the superonasal  trocar using the extrusion cannula.The superonasal trocar and infusion cannula and associated trocar were then removed and sutured with 7-0 vicryl in an interrupted fashion. A subtenon's block containing 0.75% marcaine and 2% lidocaine was administered. Kefzol + polymixin irrigation was then used over the buckle.  The conjunctiva was closed with 7-0 vicryl sutures. The eye was confirmed to be at a physiologic level by digital palpation. Subconjunctival injections of Antibiotic and kenalog were administered. The lid speculum and drapes were removed. Drops of an antibiotic, antihypertensives and steroid were given. The eye was patched and shielded. The patient tolerated the procedure well without any intraoperative or immediate postoperative complications. The patient was taken to the recovery room in good condition. The patient was instructed to maintain a strict face-down position and will be seen by Dr. Coralyn Pear tomorrow morning in clinic.     Estimate blood lost: none Complications: None

## 2018-09-11 NOTE — Anesthesia Procedure Notes (Signed)
Procedure Name: Intubation Performed by: Kathryne Hitch, CRNA Pre-anesthesia Checklist: Patient identified, Emergency Drugs available, Suction available, Patient being monitored and Timeout performed Patient Re-evaluated:Patient Re-evaluated prior to induction Oxygen Delivery Method: Circle system utilized Preoxygenation: Pre-oxygenation with 100% oxygen Induction Type: IV induction Ventilation: Mask ventilation without difficulty Laryngoscope Size: Mac and 3 Grade View: Grade I Tube type: Oral Tube size: 7.0 mm Number of attempts: 1 Placement Confirmation: ETT inserted through vocal cords under direct vision,  positive ETCO2 and breath sounds checked- equal and bilateral Secured at: 21 cm Tube secured with: Tape Dental Injury: Teeth and Oropharynx as per pre-operative assessment

## 2018-09-11 NOTE — Transfer of Care (Signed)
Immediate Anesthesia Transfer of Care Note  Patient: Dana Parks  Procedure(s) Performed: PARS PLANA VITRECTOMY WITH 25 GAUGE RIGHT EYE (Right Eye) LASER PHOTO ABLATION LEFT EYE (Left Eye) Scleral Buckle RIGHT EYE (Right Eye) Gas/Fluid Exchange RIGHT EYE (Right Eye) Photocoagulation With Laser RIGHT EYE (Right Eye) Perfluorone Injection RIGHT EYE (Right Eye)  Patient Location: PACU  Anesthesia Type:General  Level of Consciousness: awake and drowsy  Airway & Oxygen Therapy: Patient Spontanous Breathing and Patient connected to face mask oxygen  Post-op Assessment: Report given to RN and Post -op Vital signs reviewed and stable  Post vital signs: Reviewed and stable  Last Vitals:  Vitals Value Taken Time  BP 115/67 09/11/2018  6:04 PM  Temp    Pulse 100 09/11/2018  6:09 PM  Resp 19 09/11/2018  6:09 PM  SpO2 100 % 09/11/2018  6:09 PM  Vitals shown include unvalidated device data.  Last Pain:  Vitals:   09/11/18 1805  TempSrc:   PainSc: (P) Asleep         Complications: No apparent anesthesia complications

## 2018-09-11 NOTE — Anesthesia Postprocedure Evaluation (Signed)
Anesthesia Post Note  Patient: Dana Parks  Procedure(s) Performed: PARS PLANA VITRECTOMY WITH 25 GAUGE RIGHT EYE (Right Eye) LASER PHOTO ABLATION LEFT EYE (Left Eye) Scleral Buckle RIGHT EYE (Right Eye) Gas/Fluid Exchange RIGHT EYE (Right Eye) Photocoagulation With Laser RIGHT EYE (Right Eye) Perfluorone Injection RIGHT EYE (Right Eye)     Patient location during evaluation: PACU Anesthesia Type: General Level of consciousness: awake Pain management: pain level controlled Vital Signs Assessment: post-procedure vital signs reviewed and stable Respiratory status: spontaneous breathing Cardiovascular status: stable Postop Assessment: no apparent nausea or vomiting Anesthetic complications: no    Last Vitals:  Vitals:   09/11/18 1048  BP: (!) 144/90  Pulse: 87  Resp: 18  Temp: 36.6 C  SpO2: 100%    Last Pain:  Vitals:   09/11/18 1048  TempSrc: Oral                 Remus Hagedorn

## 2018-09-11 NOTE — Brief Op Note (Signed)
09/11/2018  5:56 PM  PATIENT:  Gavin Potters  33 y.o. female  PRE-OPERATIVE DIAGNOSIS:  retinal detachment, both eyes  POST-OPERATIVE DIAGNOSIS:  retinal detachment, both eyes  PROCEDURE:  Procedure(s) with comments: PARS PLANA VITRECTOMY WITH 25 GAUGE RIGHT EYE (Right) LASER PHOTO ABLATION LEFT EYE (Left) Scleral Buckle RIGHT EYE (Right) Gas/Fluid Exchange RIGHT EYE (Right) - C3F8 Photocoagulation With Laser RIGHT EYE (Right) Perfluorone Injection RIGHT EYE (Right)  SURGEON:  Surgeon(s) and Role:    Rennis Chris, MD - Primary  ASSISTANTS: Laurian Brim, OA   ANESTHESIA:   local and general  EBL:  5 mL   BLOOD ADMINISTERED:none  DRAINS: none   LOCAL MEDICATIONS USED:  BUPIVICAINE , LIDOCAINE  and Amount: 9 ml  SPECIMEN:  No Specimen  DISPOSITION OF SPECIMEN:  N/A  COUNTS:  YES  TOURNIQUET:  * No tourniquets in log *  DICTATION: .Note written in EPIC  PLAN OF CARE: Discharge to home after PACU  PATIENT DISPOSITION:  PACU - hemodynamically stable.   Delay start of Pharmacological VTE agent (>24hrs) due to surgical blood loss or risk of bleeding: not applicable

## 2018-09-11 NOTE — Progress Notes (Signed)
Pt  Unable to urinate,did serum preg,dos

## 2018-09-11 NOTE — Anesthesia Preprocedure Evaluation (Addendum)
Anesthesia Evaluation  Patient identified by MRN, date of birth, ID band Patient awake    Reviewed: Allergy & Precautions, NPO status , Patient's Chart, lab work & pertinent test results  History of Anesthesia Complications (+) PONV  Airway Mallampati: II  TM Distance: >3 FB     Dental   Pulmonary asthma , former smoker,    breath sounds clear to auscultation       Cardiovascular hypertension,  Rhythm:Regular Rate:Normal     Neuro/Psych    GI/Hepatic negative GI ROS, Neg liver ROS,   Endo/Other  negative endocrine ROS  Renal/GU negative Renal ROS     Musculoskeletal   Abdominal   Peds  Hematology   Anesthesia Other Findings   Reproductive/Obstetrics                            Anesthesia Physical Anesthesia Plan  ASA: III  Anesthesia Plan: General   Post-op Pain Management:    Induction: Intravenous  PONV Risk Score and Plan: 4 or greater and Ondansetron, Dexamethasone and Midazolam  Airway Management Planned: Oral ETT  Additional Equipment:   Intra-op Plan:   Post-operative Plan: Extubation in OR  Informed Consent: I have reviewed the patients History and Physical, chart, labs and discussed the procedure including the risks, benefits and alternatives for the proposed anesthesia with the patient or authorized representative who has indicated his/her understanding and acceptance.     Dental advisory given  Plan Discussed with: CRNA and Anesthesiologist  Anesthesia Plan Comments:         Anesthesia Quick Evaluation

## 2018-09-11 NOTE — Discharge Instructions (Signed)
POSTOPERATIVE INSTRUCTIONS ° °Your doctor has performed vitreoretinal surgery on you at . [facility]. ° °- Keep eye patched and shielded until seen by Dr. Mattox Schorr 10 AM tomorrow in clinic °- Do not use drops until return °- FACE DOWN POSITIONING WHILE AWAKE °- Sleep with belly down or on left side, avoid laying flat on back.   ° °- No strenuous bending, stooping or lifting. ° °- You may not drive until further notice. ° °- If your doctor used a gas bubble in your eye during the procedure he will advise you on postoperative positioning. If you have a gas bubble you will be wearing a green bracelet that was applied in the operating room. The green bracelet should stay on as long as the gas bubble is in your eye. While the gas bubble is present you should not fly in an airplane. If you require general anesthesia while the gas bubble is present you must notify your anesthesiologist that an intraocular gas bubble is present so he can take the appropriate precautions. ° °- Tylenol or any other over-the-counter pain reliever can be used according to your doctor. If more pain medicine is required, your doctor will have a prescription for you. ° °- You may read, go up and down stairs, and watch television. ° ° ° ° Zae Kirtz, M.D., Ph.D. ° °

## 2018-09-11 NOTE — Interval H&P Note (Signed)
History and Physical Interval Note:  09/11/2018 1:07 PM  Dana Parks  has presented today for surgery, with the diagnosis of retinal detachement, both eyes  The various methods of treatment have been discussed with the patient and family. After consideration of risks, benefits and other options for treatment, the patient has consented to  Procedure(s): PARS PLANA VITRECTOMY WITH 25 GAUGE scleral buckle, gas (Right) LASER PHOTO ABLATION (Left) as a surgical intervention .  The patient's history has been reviewed, patient examined, no change in status, stable for surgery.  I have reviewed the patient's chart and labs.  Questions were answered to the patient's satisfaction.     Rennis Chris

## 2018-09-11 NOTE — Progress Notes (Signed)
Assumed care of patient at 1900.  Patient arouses to voice but is unable to follow commands.  Unable to position patient upright with face down at this time.  Will continue to monitor.

## 2018-09-12 ENCOUNTER — Encounter (INDEPENDENT_AMBULATORY_CARE_PROVIDER_SITE_OTHER): Payer: Self-pay | Admitting: Ophthalmology

## 2018-09-12 ENCOUNTER — Ambulatory Visit (INDEPENDENT_AMBULATORY_CARE_PROVIDER_SITE_OTHER): Payer: Medicare Other | Admitting: Ophthalmology

## 2018-09-12 DIAGNOSIS — H35413 Lattice degeneration of retina, bilateral: Secondary | ICD-10-CM

## 2018-09-12 DIAGNOSIS — H5213 Myopia, bilateral: Secondary | ICD-10-CM

## 2018-09-12 DIAGNOSIS — H3323 Serous retinal detachment, bilateral: Secondary | ICD-10-CM

## 2018-09-12 DIAGNOSIS — H43813 Vitreous degeneration, bilateral: Secondary | ICD-10-CM

## 2018-09-12 DIAGNOSIS — H3581 Retinal edema: Secondary | ICD-10-CM

## 2018-09-12 DIAGNOSIS — H52203 Unspecified astigmatism, bilateral: Secondary | ICD-10-CM

## 2018-09-12 DIAGNOSIS — H2513 Age-related nuclear cataract, bilateral: Secondary | ICD-10-CM

## 2018-09-12 NOTE — Progress Notes (Deleted)
[facility] - Clinic Note  09/12/2018     CHIEF COMPLAINT Patient presents for Post-op Follow-up   HISTORY OF PRESENT ILLNESS: Dana Parks is a 33 y.o. female who presents to the clinic today for:   HPI    Post-op Follow-up    In both eyes.  Discomfort includes pain and discharge.  Vision is worse.          Comments    33 y/o female pt here for 1 day po s/p SBP + 25g PPV/membrane peel/PFO/EL/gas OD and laser retinopexy via LIO OS under general anesthesia.  VA very blurred OU (worse OD).  Having pain OD at a 10 today.  Denies flashes, floaters.  Had a hard time sleeping last night, and kept wanting to lay on her right side.  Has not yet had any pain meds or gtts.       Last edited by Celine MansBaxley, Andrew G, COA on 09/12/2018 10:55 AM. (History)      Referring physician: Fleet ContrasAvbuere, Edwin, MD [address] [place] [id number]  HISTORICAL INFORMATION:   Selected notes from the MEDICAL RECORD NUMBER Referred by Dr. Aura CampsMichael Spencer for concern of BRAO OD LEE: 01.14.20 (M. Spencer) [BCVA: OD: 20/30- OS: 20/25 Ocular Hx- PMH-anxiety, arthritis, cerebral palsy, headaches, seizures    CURRENT MEDICATIONS: No current outpatient medications on file. (Ophthalmic Drugs)   No current facility-administered medications for this visit.  (Ophthalmic Drugs)   Current Outpatient Medications (Other)  Medication Sig  . albuterol (PROVENTIL HFA;VENTOLIN HFA) 108 (90 BASE) MCG/ACT inhaler Inhale 2 puffs into the lungs every 6 (six) hours as needed for wheezing (wheezing). (Patient taking differently: Inhale 2 puffs into the lungs every 6 (six) hours as needed for wheezing. )  . benzonatate (TESSALON) 100 MG capsule Take 100 mg by mouth every 8 (eight) hours as needed for cough.   . divalproex (DEPAKOTE SPRINKLES) 125 MG capsule Take 1 capsule at bedtime (Patient taking differently: Take 125 mg by mouth at bedtime. )  . enalapril (VASOTEC) 20 MG tablet Take 1 tablet (20 mg  total) by mouth daily. (Patient not taking: Reported on 09/10/2018)  . ferrous sulfate 325 (65 FE) MG tablet Take 1 tablet (325 mg total) by mouth 2 (two) times daily with a meal. (Patient not taking: Reported on 09/10/2018)  . fluticasone (FLONASE) 50 MCG/ACT nasal spray Place 2 sprays into both nostrils daily as needed for allergies.   . hydrochlorothiazide (HYDRODIURIL) 25 MG tablet Take 1 tablet (25 mg total) by mouth daily. (Patient not taking: Reported on 09/10/2018)  . HYDROcodone-acetaminophen (NORCO/VICODIN) 5-325 MG tablet Take 1 tablet by mouth every 4 (four) hours as needed for moderate pain.  Marland Kitchen. ibuprofen (ADVIL,MOTRIN) 800 MG tablet Take 1 tablet (800 mg total) by mouth every 8 (eight) hours as needed for moderate pain. (Patient taking differently: Take 800 mg by mouth 3 (three) times daily. )  . levETIRAcetam (KEPPRA) 500 MG tablet Take 1 tablet (500 mg total) by mouth 2 (two) times daily. (Patient not taking: Reported on 09/10/2018)  . ondansetron (ZOFRAN) 4 MG tablet Take 1 tablet (4 mg total) by mouth every 6 (six) hours. (Patient not taking: Reported on 09/10/2018)  . oxyCODONE-acetaminophen (ROXICET) 5-325 MG per tablet Take 1 tablet by mouth every 6 (six) hours as needed for moderate pain or severe pain. (Patient not taking: Reported on 09/10/2018)  . predniSONE (DELTASONE) 20 MG tablet Take 3 tabs on day 1, 2-1/2 tabs on day 2, 2  tabs on day 3, 1-1/2 tabs on day 4, 1 tab on day 5, 1/2 tab on days 6 and 7, then stop. (Patient not taking: Reported on 09/10/2018)  . tiZANidine (ZANAFLEX) 4 MG tablet Take 4 mg by mouth 3 (three) times daily as needed for muscle spasms.   . Vitamin D, Ergocalciferol, (DRISDOL) 1.25 MG (50000 UT) CAPS capsule Take 50,000 Units by mouth every 7 (seven) days.    Current Facility-Administered Medications (Other)  Medication Route  . diphenhydrAMINE (BENADRYL) injection 25 mg Intramuscular  . metoCLOPramide (REGLAN) 10 mg in dextrose 5 % 50 mL IVPB Intramuscular       REVIEW OF SYSTEMS: ROS    Positive for: Neurological, Musculoskeletal, Eyes, Respiratory   Negative for: Constitutional, Gastrointestinal, Skin, Genitourinary, HENT, Endocrine, Cardiovascular, Psychiatric, Allergic/Imm, Heme/Lymph   Last edited by Celine Mans, COA on 09/12/2018 10:53 AM. (History)       ALLERGIES Allergies  Allergen Reactions  . Shrimp [Shellfish Allergy] Anaphylaxis    Can eat other shellfish  . Codeine Other (See Comments)    GI upset  . Dust Mite Extract Other (See Comments)    Unknown reaction  . Mold Extract [Trichophyton] Other (See Comments)    Unknown reaction  . Penicillins Other (See Comments)    Has patient had a PCN reaction causing immediate rash, facial/tongue/throat swelling, SOB or lightheadedness with hypotension: No Has patient had a PCN reaction causing severe rash involving mucus membranes or skin necrosis: No Has patient had a PCN reaction that required hospitalization: Yes Has patient had a PCN reaction occurring within the last 10 years: No If all of the above answers are "NO", then may proceed with Cephalosporin use.  Unknown reaction; pt has gotten cefazolin  . Soap Other (See Comments)    Unknown reaction    PAST MEDICAL HISTORY Past Medical History:  Diagnosis Date  . Anxiety   . Asthma   . Back pain    unknown   . Cerebral palsy (HCC)   . Headache(784.0)    frequently  . Hypertension   . Liver disease    Denies 09/10/2018  . Low sodium diet   . Muscle spasm    takes Flexeril daily  . Neuromuscular disorder (HCC)    cerebral palsy  . Numbness    bilateral feet  . Peripheral edema    occasionally  . PONV (postoperative nausea and vomiting)   . Rhegmatogenous retinal detachment of both eyes   . Seizure disorder (HCC)   . Seizures (HCC)    last one in high school;takes Depakote daily  . Wears glasses    Past Surgical History:  Procedure Laterality Date  . CESAREAN SECTION  2007  . CESAREAN SECTION WITH  BILATERAL TUBAL LIGATION Bilateral 03/03/2015   Procedure: CESAREAN SECTION WITH BILATERAL TUBAL LIGATION;  Surgeon: Willodean Rosenthal, MD;  Location: [facility];  Service: Obstetrics;  Laterality: Bilateral;  Requested 03/03/15 @ 3:30p  Ok per Cleo/Myra-TM  . LEG SURGERY Bilateral   . TOOTH EXTRACTION N/A 02/16/2013   Procedure: EXTRACTION 16, 17, 32;  Surgeon: Georgia Lopes, DDS;  Location: [facility] OR;  Service: Oral Surgery;  Laterality: N/A;    FAMILY HISTORY Family History  Problem Relation Age of Onset  . Diabetes Maternal Grandfather   . Hypertension Maternal Grandfather     SOCIAL HISTORY Social History   Tobacco Use  . Smoking status: Former Smoker    Packs/day: 0.25    Years: 7.00    Pack years:  1.75    Types: Cigarettes    Last attempt to quit: 11/05/2014    Years since quitting: 3.8  . Smokeless tobacco: Never Used  Substance Use Topics  . Alcohol use: No    Alcohol/week: 0.0 standard drinks  . Drug use: Yes    Types: Marijuana    Comment: feb 2020 last use         OPHTHALMIC EXAM:  Base Eye Exam    Visual Acuity (Snellen - Linear)      Right Left   Dist [place] CF 20/150   Dist ph [place] NI 20/80 -2       Tonometry (Tonopen, 10:56 AM)      Right Left   Pressure 23 15       Pupils      Dark Light Shape React APD   Right 6 5 Round Minimal None   Left 6 5 Round Minimal None       Visual Fields (Counting fingers)      Left Right    Full    Restrictions  Total superior temporal, inferior temporal, superior nasal, inferior nasal deficiencies       Extraocular Movement      Right Left    Full, Ortho Full, Ortho       Neuro/Psych    Oriented x3:  Yes   Mood/Affect:  Normal       Dilation    Both eyes:  1.0% Mydriacyl, 2.5% Phenylephrine @ 10:56 AM          IMAGING AND PROCEDURES  Imaging and Procedures for @TODAY @           ASSESSMENT/PLAN:  No diagnosis found.  1,2. Bilateral retinal detachments -- OD > OS - both with fovea on, OD  with mild inf nasal macular involvement - BCVA 20/30 OD, 20/25 OS - pt subjectively reports vision issues as far back as "years ago" - both eyes suggestive of chronic detachments - OD with extensive nasal detachment with subretinal bands and early PVR - OS with more peripheral focal rhegmatogenous retinal detachments with some pigmented demarcation lines and limited posterior extension - discussed findings, prognosis and treatment options with pt and medical power of attorney (step mother) - recommend SBP + 25g PPV/membrane peel/PFO/EL/gas OD and laser retinopexy via LIO OS under general anesthesia - RBA of procedure discussed, questions answered - informed consent obtained and signed - case scheduled for Thursday, 09/11/2018 - will need medical clearance from PCP - f/u Friday am for POV1  3. Lattice degeneration w/ atrophic holes, both eyes - significant patches of lattice w/ atrophic holes related to extensive RDs OU as described above - discussed findings, prognosis, and treatment options - will be addressed during surgery above  4. PVD OU  5. Early cataract OU - The symptoms of cataract, surgical options, and treatments and risks were discussed with patient. - discussed diagnosis and progression and likelihood of cataract progression OD following SBP/PPV OD - not yet visually significant - monitor for now  6. History of myopia w/ astigmatism OU - discussed increased incidence of RT/RD with myopia   Ophthalmic Meds Ordered this visit:  No orders of the defined types were placed in this encounter.     No follow-ups on file.  There are no Patient Instructions on file for this visit.   Explained the diagnoses, plan, and follow up with the patient and they expressed understanding.  Patient expressed understanding of the importance of proper follow up care.  This document serves as a record of services personally performed by Karie ChimeraBrian G. Emaline Karnes, MD, PhD. It was created on their  behalf by Laurian BrimAmanda Brown, OA, an ophthalmic assistant. The creation of this record is the provider's dictation and/or activities during the visit.    Electronically signed by: Laurian BrimAmanda Brown, OA  02.07.2020 11:08 AM    Karie ChimeraBrian G. Stasha Naraine, M.D., Ph.D. Diseases & Surgery of the Retina and Vitreous [facility]    Abbreviations: M myopia (nearsighted); A astigmatism; H hyperopia (farsighted); P presbyopia; Mrx spectacle prescription;  CTL contact lenses; OD right eye; OS left eye; OU both eyes  XT exotropia; ET esotropia; PEK punctate epithelial keratitis; PEE punctate epithelial erosions; DES dry eye syndrome; MGD meibomian gland dysfunction; ATs artificial tears; PFAT's preservative free artificial tears; NSC nuclear sclerotic cataract; PSC posterior subcapsular cataract; ERM epi-retinal membrane; PVD posterior vitreous detachment; RD retinal detachment; DM diabetes mellitus; DR diabetic retinopathy; NPDR non-proliferative diabetic retinopathy; PDR proliferative diabetic retinopathy; CSME clinically significant macular edema; DME diabetic macular edema; dbh dot blot hemorrhages; CWS cotton wool spot; POAG primary open angle glaucoma; C/D cup-to-disc ratio; HVF humphrey visual field; GVF goldmann visual field; OCT optical coherence tomography; IOP intraocular pressure; BRVO Branch retinal vein occlusion; CRVO central retinal vein occlusion; CRAO central retinal artery occlusion; BRAO branch retinal artery occlusion; RT retinal tear; SB scleral buckle; PPV pars plana vitrectomy; VH Vitreous hemorrhage; PRP panretinal laser photocoagulation; IVK intravitreal kenalog; VMT vitreomacular traction; MH Macular hole;  NVD neovascularization of the disc; NVE neovascularization elsewhere; AREDS age related eye disease study; ARMD age related macular degeneration; POAG primary open angle glaucoma; EBMD epithelial/anterior basement membrane dystrophy; ACIOL anterior chamber intraocular lens; IOL  intraocular lens; PCIOL posterior chamber intraocular lens; Phaco/IOL phacoemulsification with intraocular lens placement; PRK photorefractive keratectomy; LASIK laser assisted in situ keratomileusis; HTN hypertension; DM diabetes mellitus; COPD chronic obstructive pulmonary disease

## 2018-09-12 NOTE — Progress Notes (Signed)
[facility] - Clinic Note  09/12/2018     CHIEF COMPLAINT Patient presents for Post-op Follow-up   HISTORY OF PRESENT ILLNESS: Dana Parks is a 33 y.o. female who presents to the clinic today for:   HPI    Post-op Follow-up    In both eyes.  Discomfort includes pain and discharge.  Vision is worse.  I, the attending physician,  performed the HPI with the patient and updated documentation appropriately.          Comments    33 y/o female pt here for 1 day po s/p SBP + 25g PPV/membrane peel/PFO/EL/gas OD and laser retinopexy via LIO OS under general anesthesia.  VA very blurred OU (worse OD).  Having pain OD at a 10 today.  Denies flashes, floaters.  Had a hard time sleeping last night, and kept wanting to lay on her right side.  Has not yet had any pain meds or gtts.       Last edited by Rennis Chris, MD on 09/14/2018 11:56 PM. (History)      Referring physician: Fleet Contras, MD [address] [place], [place] [address]  HISTORICAL INFORMATION:   Selected notes from the MEDICAL RECORD NUMBER Referred by Dr. Aura Camps for concern of BRAO OD LEE: 01.14.20 (M. Spencer) [BCVA: OD: 20/30- OS: 20/25 Ocular Hx- PMH-anxiety, arthritis, cerebral palsy, headaches, seizures    CURRENT MEDICATIONS: No current outpatient medications on file. (Ophthalmic Drugs)   No current facility-administered medications for this visit.  (Ophthalmic Drugs)   Current Outpatient Medications (Other)  Medication Sig  . albuterol (PROVENTIL HFA;VENTOLIN HFA) 108 (90 BASE) MCG/ACT inhaler Inhale 2 puffs into the lungs every 6 (six) hours as needed for wheezing (wheezing). (Patient taking differently: Inhale 2 puffs into the lungs every 6 (six) hours as needed for wheezing. )  . benzonatate (TESSALON) 100 MG capsule Take 100 mg by mouth every 8 (eight) hours as needed for cough.   . divalproex (DEPAKOTE SPRINKLES) 125 MG capsule Take 1 capsule at bedtime (Patient taking  differently: Take 125 mg by mouth at bedtime. )  . enalapril (VASOTEC) 20 MG tablet Take 1 tablet (20 mg total) by mouth daily. (Patient not taking: Reported on 09/10/2018)  . ferrous sulfate 325 (65 FE) MG tablet Take 1 tablet (325 mg total) by mouth 2 (two) times daily with a meal. (Patient not taking: Reported on 09/10/2018)  . fluticasone (FLONASE) 50 MCG/ACT nasal spray Place 2 sprays into both nostrils daily as needed for allergies.   . hydrochlorothiazide (HYDRODIURIL) 25 MG tablet Take 1 tablet (25 mg total) by mouth daily. (Patient not taking: Reported on 09/10/2018)  . HYDROcodone-acetaminophen (NORCO/VICODIN) 5-325 MG tablet Take 1 tablet by mouth every 4 (four) hours as needed for moderate pain.  Marland Kitchen ibuprofen (ADVIL,MOTRIN) 800 MG tablet Take 1 tablet (800 mg total) by mouth every 8 (eight) hours as needed for moderate pain. (Patient taking differently: Take 800 mg by mouth 3 (three) times daily. )  . levETIRAcetam (KEPPRA) 500 MG tablet Take 1 tablet (500 mg total) by mouth 2 (two) times daily. (Patient not taking: Reported on 09/10/2018)  . ondansetron (ZOFRAN) 4 MG tablet Take 1 tablet (4 mg total) by mouth every 6 (six) hours. (Patient not taking: Reported on 09/10/2018)  . oxyCODONE-acetaminophen (ROXICET) 5-325 MG per tablet Take 1 tablet by mouth every 6 (six) hours as needed for moderate pain or severe pain. (Patient not taking: Reported on 09/10/2018)  . predniSONE (DELTASONE)  20 MG tablet Take 3 tabs on day 1, 2-1/2 tabs on day 2, 2 tabs on day 3, 1-1/2 tabs on day 4, 1 tab on day 5, 1/2 tab on days 6 and 7, then stop. (Patient not taking: Reported on 09/10/2018)  . tiZANidine (ZANAFLEX) 4 MG tablet Take 4 mg by mouth 3 (three) times daily as needed for muscle spasms.   . Vitamin D, Ergocalciferol, (DRISDOL) 1.25 MG (50000 UT) CAPS capsule Take 50,000 Units by mouth every 7 (seven) days.    Current Facility-Administered Medications (Other)  Medication Route  . diphenhydrAMINE (BENADRYL)  injection 25 mg Intramuscular  . metoCLOPramide (REGLAN) 10 mg in dextrose 5 % 50 mL IVPB Intramuscular      REVIEW OF SYSTEMS: ROS    Positive for: Neurological, Musculoskeletal, Eyes, Respiratory   Negative for: Constitutional, Gastrointestinal, Skin, Genitourinary, HENT, Endocrine, Cardiovascular, Psychiatric, Allergic/Imm, Heme/Lymph   Last edited by Celine Mans, COA on 09/12/2018 10:53 AM. (History)       ALLERGIES Allergies  Allergen Reactions  . Shrimp [Shellfish Allergy] Anaphylaxis    Can eat other shellfish  . Codeine Other (See Comments)    GI upset  . Dust Mite Extract Other (See Comments)    Unknown reaction  . Mold Extract [Trichophyton] Other (See Comments)    Unknown reaction  . Penicillins Other (See Comments)    Has patient had a PCN reaction causing immediate rash, facial/tongue/throat swelling, SOB or lightheadedness with hypotension: No Has patient had a PCN reaction causing severe rash involving mucus membranes or skin necrosis: No Has patient had a PCN reaction that required hospitalization: Yes Has patient had a PCN reaction occurring within the last 10 years: No If all of the above answers are "NO", then may proceed with Cephalosporin use.  Unknown reaction; pt has gotten cefazolin  . Soap Other (See Comments)    Unknown reaction    PAST MEDICAL HISTORY Past Medical History:  Diagnosis Date  . Anxiety   . Asthma   . Back pain    unknown   . Cerebral palsy (HCC)   . Headache(784.0)    frequently  . Hypertension   . Liver disease    Denies 09/10/2018  . Low sodium diet   . Muscle spasm    takes Flexeril daily  . Neuromuscular disorder (HCC)    cerebral palsy  . Numbness    bilateral feet  . Peripheral edema    occasionally  . PONV (postoperative nausea and vomiting)   . Rhegmatogenous retinal detachment of both eyes   . Seizure disorder (HCC)   . Seizures (HCC)    last one in high school;takes Depakote daily  . Wears glasses     Past Surgical History:  Procedure Laterality Date  . CESAREAN SECTION  2007  . CESAREAN SECTION WITH BILATERAL TUBAL LIGATION Bilateral 03/03/2015   Procedure: CESAREAN SECTION WITH BILATERAL TUBAL LIGATION;  Surgeon: Willodean Rosenthal, MD;  Location: [facility];  Service: Obstetrics;  Laterality: Bilateral;  Requested 03/03/15 @ 3:30p  Ok per Cleo/Myra-TM  . GAS/FLUID EXCHANGE Right 09/11/2018   Procedure: Gas/Fluid Exchange RIGHT EYE;  Surgeon: Rennis Chris, MD;  Location: [facility] OR;  Service: Ophthalmology;  Laterality: Right;  C3F8  . LASER PHOTO ABLATION Left 09/11/2018   Procedure: LASER PHOTO ABLATION LEFT EYE;  Surgeon: Rennis Chris, MD;  Location: [facility] OR;  Service: Ophthalmology;  Laterality: Left;  . LEG SURGERY Bilateral   . PARS PLANA VITRECTOMY Right 09/11/2018   Procedure: PARS PLANA  VITRECTOMY WITH 25 GAUGE RIGHT EYE;  Surgeon: Rennis Chris, MD;  Location: [facility] OR;  Service: Ophthalmology;  Laterality: Right;  . PERFLUORONE INJECTION Right 09/11/2018   Procedure: Perfluorone Injection RIGHT EYE;  Surgeon: Rennis Chris, MD;  Location: [facility] OR;  Service: Ophthalmology;  Laterality: Right;  . PHOTOCOAGULATION WITH LASER Right 09/11/2018   Procedure: Photocoagulation With Laser RIGHT EYE;  Surgeon: Rennis Chris, MD;  Location: [facility] OR;  Service: Ophthalmology;  Laterality: Right;  . SCLERAL BUCKLE Right 09/11/2018   Procedure: Scleral Buckle RIGHT EYE;  Surgeon: Rennis Chris, MD;  Location: [facility] OR;  Service: Ophthalmology;  Laterality: Right;  . TOOTH EXTRACTION N/A 02/16/2013   Procedure: EXTRACTION 16, 17, 32;  Surgeon: Georgia Lopes, DDS;  Location: [facility] OR;  Service: Oral Surgery;  Laterality: N/A;    FAMILY HISTORY Family History  Problem Relation Age of Onset  . Diabetes Maternal Grandfather   . Hypertension Maternal Grandfather     SOCIAL HISTORY Social History   Tobacco Use  . Smoking status: Former Smoker    Packs/day: 0.25    Years: 7.00    Pack years: 1.75    Types:  Cigarettes    Last attempt to quit: 11/05/2014    Years since quitting: 3.8  . Smokeless tobacco: Never Used  Substance Use Topics  . Alcohol use: No    Alcohol/week: 0.0 standard drinks  . Drug use: Yes    Types: Marijuana    Comment: feb 2020 last use         OPHTHALMIC EXAM:  Base Eye Exam    Visual Acuity (Snellen - Linear)      Right Left   Dist [place] CF 20/150   Dist ph [place] NI 20/80 -2       Tonometry (Tonopen, 10:56 AM)      Right Left   Pressure 23 15       Pupils      Dark Light Shape React APD   Right 6 5 Round Minimal None   Left 6 5 Round Minimal None       Visual Fields (Counting fingers)      Left Right    Full    Restrictions  Total superior temporal, inferior temporal, superior nasal, inferior nasal deficiencies       Extraocular Movement      Right Left    Full, Ortho Full, Ortho       Neuro/Psych    Oriented x3:  Yes   Mood/Affect:  Normal       Dilation    Both eyes:  1.0% Mydriacyl, 2.5% Phenylephrine @ 10:56 AM        Slit Lamp and Fundus Exam    Slit Lamp Exam      Right Left   Lids/Lashes Normal, periorbital edema Normal   Conjunctiva/Sclera Subconjunctival hemorrhage, sutures intact White and quiet   Cornea Clear Clear; 1+ PEE   Anterior Chamber moderate depth, 3+ Cell, mild fibrin raction deep; clear   Iris Round, dilated Round, dilated   Lens 1+NSC; 1+CC, 2-3+PC feathering 1+NSC; 1+CC   Vitreous post vitrectomy, good gas fill +PVD       Fundus Exam      Right Left   Disc Compact, trace Pallor Compact, trace Pallor   C/D Ratio 0.5 0.5   Macula attached; mild ERM; RPE mottling Flat, Good foveal reflex   Vessels attenuated Vascular attenuation, Tortuous   Periphery Retina attached over buckle, good buckle height, good 360 laser  Focal RD from 1-2 oclock with retinal hole at 0130; focal detachment from 3-530 w/  3 IT tears; focal detachment from 0700-0730 w/ reitnal hole at 0730. Patches of lattice degeneration, good early 360  laser--posterior to all pathologies          IMAGING AND PROCEDURES  Imaging and Procedures for @TODAY @           ASSESSMENT/PLAN:    ICD-10-CM   1. 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 Retinal edema H35.81   4. Posterior vitreous detachment of both eyes H43.813   5. Nuclear sclerosis of both eyes H25.13   6. 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 Bilateral retinal detachments -- OD > OS  - both with fovea on, OD with mild inf nasal macular involvement  - pt subjectively reports vision issues as far back as "years ago"  - both eyes suggestive of chronic detachments  - OD with extensive nasal detachment with subretinal bands and early PVR  - OS with more peripheral focal rhegmatogenous retinal detachments with some pigmented demarcation lines and limited posterior extension - OD now POD1 s/p SBP + PPV/PFC/EL/FAX/14% C3F8 OD, 2.06.20             - doing well this morning             - retina attached and in good position -- good buckle height and laser around breaks             - IOP mildly elevatedto 23 and + cell/fibrin in AC OD             - start   PF 6x/day OD                         zymaxid QID OD                         Atropine BID OD                                             Cosopt BID OD                         PSO ung QID OD             - cont face down positioning x3 days; avoid laying flat on back             - eye shield when sleeping             - post op drop and positioning instructions reviewed             - tylenol/ibuprofen for pain             - Rx given for breakthrough pain  - f/u Friday am for POW1 - OS now POD1 s/p 360 barricade laser retinopexy OS, 2.06.2020  - good early laser changes in place  - will continue to monitor for progression  3. Lattice degeneration w/ atrophic holes, both eyes  - significant patches of lattice w/ atrophic holes related  to extensive RDs OU as described above  - discussed findings, prognosis, and treatment options  - lattice  addressed during surgery above  4. PVD OU  5. Early cataract OU  - The symptoms of cataract, surgical options, and treatments and risks were discussed with patient.  - discussed diagnosis and progression and likelihood of cataract progression OD following SBP/PPV OD  - OD with significant PC feathering today  - monitor for now  6. History of myopia w/ astigmatism OU  - discussed increased incidence of RT/RD with myopia   Ophthalmic Meds Ordered this visit:  No orders of the defined types were placed in this encounter.     Return in about 1 week (around 09/19/2018) for f/u POW1, RD OD.  There are no Patient Instructions on file for this visit.   Explained the diagnoses, plan, and follow up with the patient and they expressed understanding.  Patient expressed understanding of the importance of proper follow up care.   This document serves as a record of services personally performed by Karie Chimera, MD, PhD. It was created on their behalf by Laurian Brim, OA, an ophthalmic assistant. The creation of this record is the provider's dictation and/or activities during the visit.    Electronically signed by: Laurian Brim, OA  02.07.2020 12:00 AM    Karie Chimera, M.D., Ph.D. Diseases & Surgery of the Retina and Vitreous [facility]  I have reviewed the above documentation for accuracy and completeness, and I agree with the above. Karie Chimera, M.D., Ph.D. 09/15/18 12:00 AM   Abbreviations: M myopia (nearsighted); A astigmatism; H hyperopia (farsighted); P presbyopia; Mrx spectacle prescription;  CTL contact lenses; OD right eye; OS left eye; OU both eyes  XT exotropia; ET esotropia; PEK punctate epithelial keratitis; PEE punctate epithelial erosions; DES dry eye syndrome; MGD meibomian gland dysfunction; ATs artificial tears; PFAT's preservative free  artificial tears; NSC nuclear sclerotic cataract; PSC posterior subcapsular cataract; ERM epi-retinal membrane; PVD posterior vitreous detachment; RD retinal detachment; DM diabetes mellitus; DR diabetic retinopathy; NPDR non-proliferative diabetic retinopathy; PDR proliferative diabetic retinopathy; CSME clinically significant macular edema; DME diabetic macular edema; dbh dot blot hemorrhages; CWS cotton wool spot; POAG primary open angle glaucoma; C/D cup-to-disc ratio; HVF humphrey visual field; GVF goldmann visual field; OCT optical coherence tomography; IOP intraocular pressure; BRVO Branch retinal vein occlusion; CRVO central retinal vein occlusion; CRAO central retinal artery occlusion; BRAO branch retinal artery occlusion; RT retinal tear; SB scleral buckle; PPV pars plana vitrectomy; VH Vitreous hemorrhage; PRP panretinal laser photocoagulation; IVK intravitreal kenalog; VMT vitreomacular traction; MH Macular hole;  NVD neovascularization of the disc; NVE neovascularization elsewhere; AREDS age related eye disease study; ARMD age related macular degeneration; POAG primary open angle glaucoma; EBMD epithelial/anterior basement membrane dystrophy; ACIOL anterior chamber intraocular lens; IOL intraocular lens; PCIOL posterior chamber intraocular lens; Phaco/IOL phacoemulsification with intraocular lens placement; PRK photorefractive keratectomy; LASIK laser assisted in situ keratomileusis; HTN hypertension; DM diabetes mellitus; COPD chronic obstructive pulmonary disease

## 2018-09-14 ENCOUNTER — Encounter (INDEPENDENT_AMBULATORY_CARE_PROVIDER_SITE_OTHER): Payer: Self-pay | Admitting: Ophthalmology

## 2018-09-15 ENCOUNTER — Encounter (INDEPENDENT_AMBULATORY_CARE_PROVIDER_SITE_OTHER): Payer: Medicare Other | Admitting: Ophthalmology

## 2018-09-15 ENCOUNTER — Encounter (INDEPENDENT_AMBULATORY_CARE_PROVIDER_SITE_OTHER): Payer: Self-pay

## 2018-09-18 NOTE — Progress Notes (Signed)
[facility] - Clinic Note  09/19/2018     CHIEF COMPLAINT Patient presents for Post-op Follow-up   HISTORY OF PRESENT ILLNESS: Dana Parks Staunton is a 33 y.o. female who presents to the clinic today for:   HPI    Post-op Follow-up    In both eyes.  Discomfort includes discharge.  Vision is improved.  I, the attending physician,  performed the HPI with the patient and updated documentation appropriately.          Comments    33 y/o female pt here for po visit.  S/p SBP+PPV/PFC/EL/FAX/14% C3F8 OD on 2.6.20 and s/p 360 barricade laser retinopexy OS on 2.6.20.  Pt doing well overall.  VA OD stable.  VA OS much improved.  Denies pain, flashes, floaters, but reports some minor discomfort OD at night.  PF 6x/day OD Zymaxid QID OD Atropine BID OD Cosopt BID OD PSO ung QID OD       Last edited by Rennis ChrisZamora, Maude Hettich, MD on 09/21/2018  5:00 PM. (History)    pt has been doing well this week, she states they have been able to keep her eye cleaner, step mom she is having a hard time getting pt to keep her head in the correct position, pt states her eye feels "heavy"  Referring physician: Fleet ContrasAvbuere, Edwin, MD [address] [place], [place] [id number]  HISTORICAL INFORMATION:   Selected notes from the MEDICAL RECORD NUMBER Referred by Dr. Aura CampsMichael Spencer for concern of BRAO OD LEE: 01.14.20 Kirk Ruths(M. Spencer) [BCVA: OD: 20/30- OS: 20/25 Ocular Hx- PMH-anxiety, arthritis, cerebral palsy, headaches, seizures    CURRENT MEDICATIONS: Current Outpatient Medications (Ophthalmic Drugs)  Medication Sig  . prednisoLONE acetate (PRED FORTE) 1 % ophthalmic suspension Place 1 drop into the right eye 4 (four) times daily.   No current facility-administered medications for this visit.  (Ophthalmic Drugs)   Current Outpatient Medications (Other)  Medication Sig  . albuterol (PROVENTIL HFA;VENTOLIN HFA) 108 (90 BASE) MCG/ACT inhaler Inhale 2 puffs into the lungs every 6 (six) hours as needed  for wheezing (wheezing). (Patient taking differently: Inhale 2 puffs into the lungs every 6 (six) hours as needed for wheezing. )  . benzonatate (TESSALON) 100 MG capsule Take 100 mg by mouth every 8 (eight) hours as needed for cough.   . divalproex (DEPAKOTE SPRINKLES) 125 MG capsule Take 1 capsule at bedtime (Patient taking differently: Take 125 mg by mouth at bedtime. )  . enalapril (VASOTEC) 20 MG tablet Take 1 tablet (20 mg total) by mouth daily. (Patient not taking: Reported on 09/10/2018)  . ferrous sulfate 325 (65 FE) MG tablet Take 1 tablet (325 mg total) by mouth 2 (two) times daily with a meal. (Patient not taking: Reported on 09/10/2018)  . fluticasone (FLONASE) 50 MCG/ACT nasal spray Place 2 sprays into both nostrils daily as needed for allergies.   . hydrochlorothiazide (HYDRODIURIL) 25 MG tablet Take 1 tablet (25 mg total) by mouth daily. (Patient not taking: Reported on 09/10/2018)  . HYDROcodone-acetaminophen (NORCO/VICODIN) 5-325 MG tablet Take 1 tablet by mouth every 4 (four) hours as needed for moderate pain.  Marland Kitchen. ibuprofen (ADVIL,MOTRIN) 800 MG tablet Take 1 tablet (800 mg total) by mouth every 8 (eight) hours as needed for moderate pain. (Patient taking differently: Take 800 mg by mouth 3 (three) times daily. )  . levETIRAcetam (KEPPRA) 500 MG tablet Take 1 tablet (500 mg total) by mouth 2 (two) times daily. (Patient not taking: Reported on 09/10/2018)  .  ondansetron (ZOFRAN) 4 MG tablet Take 1 tablet (4 mg total) by mouth every 6 (six) hours. (Patient not taking: Reported on 09/10/2018)  . oxyCODONE-acetaminophen (ROXICET) 5-325 MG per tablet Take 1 tablet by mouth every 6 (six) hours as needed for moderate pain or severe pain. (Patient not taking: Reported on 09/10/2018)  . predniSONE (DELTASONE) 20 MG tablet Take 3 tabs on day 1, 2-1/2 tabs on day 2, 2 tabs on day 3, 1-1/2 tabs on day 4, 1 tab on day 5, 1/2 tab on days 6 and 7, then stop. (Patient not taking: Reported on 09/10/2018)  .  tiZANidine (ZANAFLEX) 4 MG tablet Take 4 mg by mouth 3 (three) times daily as needed for muscle spasms.   . Vitamin D, Ergocalciferol, (DRISDOL) 1.25 MG (50000 UT) CAPS capsule Take 50,000 Units by mouth every 7 (seven) days.    Current Facility-Administered Medications (Other)  Medication Route  . diphenhydrAMINE (BENADRYL) injection 25 mg Intramuscular  . metoCLOPramide (REGLAN) 10 mg in dextrose 5 % 50 mL IVPB Intramuscular      REVIEW OF SYSTEMS: ROS    Positive for: Neurological, Musculoskeletal, Eyes, Respiratory   Negative for: Constitutional, Gastrointestinal, Skin, Genitourinary, HENT, Endocrine, Cardiovascular, Psychiatric, Allergic/Imm, Heme/Lymph   Last edited by Celine Mans, COA on 09/19/2018 10:18 AM. (History)       ALLERGIES Allergies  Allergen Reactions  . Shrimp [Shellfish Allergy] Anaphylaxis    Can eat other shellfish  . Codeine Other (See Comments)    GI upset  . Dust Mite Extract Other (See Comments)    Unknown reaction  . Mold Extract [Trichophyton] Other (See Comments)    Unknown reaction  . Penicillins Other (See Comments)    Has patient had a PCN reaction causing immediate rash, facial/tongue/throat swelling, SOB or lightheadedness with hypotension: No Has patient had a PCN reaction causing severe rash involving mucus membranes or skin necrosis: No Has patient had a PCN reaction that required hospitalization: Yes Has patient had a PCN reaction occurring within the last 10 years: No If all of the above answers are "NO", then may proceed with Cephalosporin use.  Unknown reaction; pt has gotten cefazolin  . Soap Other (See Comments)    Unknown reaction    PAST MEDICAL HISTORY Past Medical History:  Diagnosis Date  . Anxiety   . Asthma   . Back pain    unknown   . Cerebral palsy (HCC)   . Headache(784.0)    frequently  . Hypertension   . Liver disease    Denies 09/10/2018  . Low sodium diet   . Muscle spasm    takes Flexeril daily  .  Neuromuscular disorder (HCC)    cerebral palsy  . Numbness    bilateral feet  . Peripheral edema    occasionally  . PONV (postoperative nausea and vomiting)   . Rhegmatogenous retinal detachment of both eyes   . Seizure disorder (HCC)   . Seizures (HCC)    last one in high school;takes Depakote daily  . Wears glasses    Past Surgical History:  Procedure Laterality Date  . CESAREAN SECTION  2007  . CESAREAN SECTION WITH BILATERAL TUBAL LIGATION Bilateral 03/03/2015   Procedure: CESAREAN SECTION WITH BILATERAL TUBAL LIGATION;  Surgeon: Willodean Rosenthal, MD;  Location: [facility]S;  Service: Obstetrics;  Laterality: Bilateral;  Requested 03/03/15 @ 3:30p  Ok per Cleo/Myra-TM  . GAS/FLUID EXCHANGE Right 09/11/2018   Procedure: Gas/Fluid Exchange RIGHT EYE;  Surgeon: Rennis Chris, MD;  Location:  [facility] OR;  Service: Ophthalmology;  Laterality: Right;  C3F8  . LASER PHOTO ABLATION Left 09/11/2018   Procedure: LASER PHOTO ABLATION LEFT EYE;  Surgeon: Rennis Chris, MD;  Location: [facility] OR;  Service: Ophthalmology;  Laterality: Left;  . LEG SURGERY Bilateral   . PARS PLANA VITRECTOMY Right 09/11/2018   Procedure: PARS PLANA VITRECTOMY WITH 25 GAUGE RIGHT EYE;  Surgeon: Rennis Chris, MD;  Location: [facility] OR;  Service: Ophthalmology;  Laterality: Right;  . PERFLUORONE INJECTION Right 09/11/2018   Procedure: Perfluorone Injection RIGHT EYE;  Surgeon: Rennis Chris, MD;  Location[facility] OR;  Service: Ophthalmology;  Laterality: Right;  . PHOTOCOAGULATION WITH LASER Right 09/11/2018   Procedure: Photocoagulation With Laser RIGHT EYE;  Surgeon: Rennis Chris, MD;  Location: [facility] OR;  Service: Ophthalmology;  Laterality: Right;  . SCLERAL BUCKLE Right 09/11/2018   Procedure: Scleral Buckle RIGHT EYE;  Surgeon: Rennis Chris, MD;  Location: [facility] OR;  Service: Ophthalmology;  Laterality: Right;  . TOOTH EXTRACTION N/A 02/16/2013   Procedure: EXTRACTION 16, 17, 32;  Surgeon: Georgia Lopes, DDS;  Location: [facility] OR;  Service: Oral  Surgery;  Laterality: N/A;    FAMILY HISTORY Family History  Problem Relation Age of Onset  . Diabetes Maternal Grandfather   . Hypertension Maternal Grandfather     SOCIAL HISTORY Social History   Tobacco Use  . Smoking status: Former Smoker    Packs/day: 0.25    Years: 7.00    Pack years: 1.75    Types: Cigarettes    Last attempt to quit: 11/05/2014    Years since quitting: 3.8  . Smokeless tobacco: Never Used  Substance Use Topics  . Alcohol use: No    Alcohol/week: 0.0 standard drinks  . Drug use: Yes    Types: Marijuana    Comment: feb 2020 last use         OPHTHALMIC EXAM:  Base Eye Exam    Visual Acuity (Snellen - Linear)      Right Left   Dist cc CF 20/40   Dist ph cc NI 20/30 -2   Correction:  Glasses       Tonometry (Tonopen, 10:22 AM)      Right Left   Pressure 20 15  Squeezing       Pupils      Dark Light Shape React APD   Right 5 5 Round Minimal None   Left 5 5 Round Minimal None  Pharm dil OD       Visual Fields (Counting fingers)      Left Right    Full    Restrictions  Partial outer superior temporal, inferior temporal, superior nasal, inferior nasal deficiencies       Extraocular Movement      Right Left    Full, Ortho Full, Ortho       Neuro/Psych    Oriented x3:  Yes   Mood/Affect:  Normal       Dilation    Both eyes:  1.0% Mydriacyl, 2.5% Phenylephrine @ 10:23 AM        Slit Lamp and Fundus Exam    Slit Lamp Exam      Right Left   Lids/Lashes Normal, periorbital edema Normal   Conjunctiva/Sclera Subconjunctival hemorrhage - improving, sutures intact White and quiet   Cornea 2+ Punctate epithelial erosions, ointment Clear; 1+ PEE   Anterior Chamber Deep and quiet deep; clear   Iris Round, dilated Round, dilated   Lens Trace PSC 1+NSC; 1+CC  Vitreous post vitrectomy, gas bubble at 70% +PVD       Fundus Exam      Right Left   Disc Compact, trace Pallor Compact, trace Pallor   C/D Ratio 0.5 0.5   Macula  attached; mild ERM; RPE mottling Flat, Good foveal reflex   Vessels attenuated Vascular attenuation, Tortuous   Periphery Retina attached over buckle, good buckle height, good 360 laser Focal RD from 1-2 oclock with retinal hole at 0130; focal detachment from 3-530 w/  3 IT tears; focal detachment from 0700-0730 w/ reitnal hole at 0730. Patches of lattice degeneration, good 360 laser--posterior to all pathologies -- good laser around all breaks          IMAGING AND PROCEDURES  Imaging and Procedures for @TODAY @           ASSESSMENT/PLAN:    ICD-10-CM   1. 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 Retinal edema H35.81   4. Posterior vitreous detachment of both eyes H43.813   5. Nuclear sclerosis of both eyes H25.13   6. 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 Bilateral retinal detachments -- OD > OS  - both with fovea on, OD with mild inf nasal macular involvement  - pt subjectively reports vision issues as far back as "years ago"  - both eyes suggestive of chronic detachments  - OD with extensive nasal detachment with subretinal bands and early PVR  - OS with more peripheral focal rhegmatogenous retinal detachments with some pigmented demarcation lines and limited posterior extension - OD now POW1 s/p SBP + PPV/PFC/EL/FAX/14% C3F8 OD, 2.06.20             - doing well -- questionable compliance with face down positioning             - retina attached and in good position -- good buckle height and laser around breaks             - IOP okay at 20  - gas bubble at 70%             - cont   PF 4x/day OD                         zymaxid QID OD -- stop when bottle runs out                         Atropine BID OD -- stop when bottle runs out                                            Cosopt BID OD                         PSO ung QID OD             - cont face down positioning 50% of time; avoid laying  flat on back             - eye shield when sleeping             - post op drop and positioning instructions reviewed             -  tylenol/ibuprofen for pain             - Rx given for breakthrough pain - OS now POW1 s/p 360 barricade laser retinopexy OS, 2.06.2020  - good laser changes in place  - will continue to monitor for progression - f/u 2 wks  3. Lattice degeneration w/ atrophic holes, both eyes  - significant patches of lattice w/ atrophic holes related to extensive RDs OU as described above  - discussed findings, prognosis, and treatment options  - lattice addressed during surgery above  4. PVD OU  5. Early cataract OU  - The symptoms of cataract, surgical options, and treatments and risks were discussed with patient.  - discussed diagnosis and progression and likelihood of cataract progression OD following SBP/PPV OD  - OD with significant PC feathering today  - monitor for now  6. History of myopia w/ astigmatism OU  -discussed increased incidence of RT/RD with myopia    Ophthalmic Meds Ordered this visit:  Meds ordered this encounter  Medications  . prednisoLONE acetate (PRED FORTE) 1 % ophthalmic suspension    Sig: Place 1 drop into the right eye 4 (four) times daily.    Dispense:  15 mL    Refill:  0      Return in about 2 weeks (around 10/03/2018) for POV.  There are no Patient Instructions on file for this visit.   Explained the diagnoses, plan, and follow up with the patient and they expressed understanding.  Patient expressed understanding of the importance of proper follow up care.   This document serves as a record of services personally performed by Karie ChimeraBrian G. Sebert Stollings, MD, PhD. It was created on their behalf by Laurian BrimAmanda Brown, OA, an ophthalmic assistant. The creation of this record is the provider's dictation and/or activities during the visit.    Electronically signed by: Laurian BrimAmanda Brown, OA  02.13.2020 5:05 PM    Karie ChimeraBrian G. Arye Weyenberg, M.D.,  Ph.D. Diseases & Surgery of the Retina and Vitreous [facility]   I have reviewed the above documentation for accuracy and completeness, and I agree with the above. Karie ChimeraBrian G. Renton Berkley, M.D., Ph.D. 09/21/18 5:10 PM    Abbreviations: M myopia (nearsighted); A astigmatism; H hyperopia (farsighted); P presbyopia; Mrx spectacle prescription;  CTL contact lenses; OD right eye; OS left eye; OU both eyes  XT exotropia; ET esotropia; PEK punctate epithelial keratitis; PEE punctate epithelial erosions; DES dry eye syndrome; MGD meibomian gland dysfunction; ATs artificial tears; PFAT's preservative free artificial tears; NSC nuclear sclerotic cataract; PSC posterior subcapsular cataract; ERM epi-retinal membrane; PVD posterior vitreous detachment; RD retinal detachment; DM diabetes mellitus; DR diabetic retinopathy; NPDR non-proliferative diabetic retinopathy; PDR proliferative diabetic retinopathy; CSME clinically significant macular edema; DME diabetic macular edema; dbh dot blot hemorrhages; CWS cotton wool spot; POAG primary open angle glaucoma; C/D cup-to-disc ratio; HVF humphrey visual field; GVF goldmann visual field; OCT optical coherence tomography; IOP intraocular pressure; BRVO Branch retinal vein occlusion; CRVO central retinal vein occlusion; CRAO central retinal artery occlusion; BRAO branch retinal artery occlusion; RT retinal tear; SB scleral buckle; PPV pars plana vitrectomy; VH Vitreous hemorrhage; PRP panretinal laser photocoagulation; IVK intravitreal kenalog; VMT vitreomacular traction; MH Macular hole;  NVD neovascularization of the disc; NVE neovascularization elsewhere; AREDS age related eye disease study; ARMD age related macular degeneration; POAG primary open angle glaucoma; EBMD epithelial/anterior basement membrane dystrophy; ACIOL anterior chamber intraocular lens; IOL intraocular lens; PCIOL posterior chamber intraocular lens; Phaco/IOL phacoemulsification with  intraocular lens  placement; [place] photorefractive keratectomy; LASIK laser assisted in situ keratomileusis; HTN hypertension; DM diabetes mellitus; COPD chronic obstructive pulmonary disease

## 2018-09-19 ENCOUNTER — Encounter (INDEPENDENT_AMBULATORY_CARE_PROVIDER_SITE_OTHER): Payer: Self-pay | Admitting: Ophthalmology

## 2018-09-19 ENCOUNTER — Ambulatory Visit (INDEPENDENT_AMBULATORY_CARE_PROVIDER_SITE_OTHER): Payer: Medicare Other | Admitting: Ophthalmology

## 2018-09-19 DIAGNOSIS — H52203 Unspecified astigmatism, bilateral: Secondary | ICD-10-CM

## 2018-09-19 DIAGNOSIS — H43813 Vitreous degeneration, bilateral: Secondary | ICD-10-CM

## 2018-09-19 DIAGNOSIS — H35413 Lattice degeneration of retina, bilateral: Secondary | ICD-10-CM

## 2018-09-19 DIAGNOSIS — H2513 Age-related nuclear cataract, bilateral: Secondary | ICD-10-CM

## 2018-09-19 DIAGNOSIS — H5213 Myopia, bilateral: Secondary | ICD-10-CM

## 2018-09-19 DIAGNOSIS — H3581 Retinal edema: Secondary | ICD-10-CM

## 2018-09-19 DIAGNOSIS — H3323 Serous retinal detachment, bilateral: Secondary | ICD-10-CM

## 2018-09-19 MED ORDER — PREDNISOLONE ACETATE 1 % OP SUSP: 1.0000 [drp] | Freq: Four times a day (QID) | OPHTHALMIC | 0 refills | Status: DC

## 2018-09-21 ENCOUNTER — Encounter (INDEPENDENT_AMBULATORY_CARE_PROVIDER_SITE_OTHER): Payer: Self-pay | Admitting: Ophthalmology

## 2018-09-23 MED ORDER — BACITRACIN-POLYMYXIN B 500-10000 UNIT/GM OP OINT: TOPICAL_OINTMENT | Freq: Four times a day (QID) | OPHTHALMIC | 3 refills | Status: DC

## 2018-09-23 NOTE — Addendum Note (Signed)
Addended by: Karie Chimera on: 09/23/2018 11:37 AM   Modules accepted: Orders

## 2018-10-03 ENCOUNTER — Ambulatory Visit: Payer: Medicare Other | Admitting: Neurology

## 2018-10-04 NOTE — Progress Notes (Addendum)
[facility] - Clinic Note  10/06/2018     CHIEF COMPLAINT Patient presents for Retina Follow Up   HISTORY OF PRESENT ILLNESS: Dana Parks is a 33 y.o. female who presents to the clinic today for:   HPI    Retina Follow Up    Patient presents with  Retinal Break/Detachment.  In right eye.  This started 2 weeks ago.  Severity is moderate.  Duration of 2 weeks.  Since onset it is gradually improving.  I, the attending physician,  performed the HPI with the patient and updated documentation appropriately.          Comments    Patient here for 2 weeks follow up for  RD OD S/P SBP 02.06 OD, laser Retinopexy OS. Patient states vision doing good. Has pain at night uses Ibuprofen for pain.        Last edited by Rennis Chris, MD on 10/06/2018 12:12 PM. (History)    pt states her eye has been draining a lot, she states she has some pain at night and takes ibuprofen,   Referring physician: Fleet Contras, MD [address] [place], [place] [address]  HISTORICAL INFORMATION:   Selected notes from the MEDICAL RECORD NUMBER Referred by Dr. Aura Camps for concern of BRAO OD LEE: 01.14.20 (MKarleen Hampshire) [BCVA: OD: 20/30- OS: 20/25 Ocular Hx- PMH-anxiety, arthritis, cerebral palsy, headaches, seizures    CURRENT MEDICATIONS: Current Outpatient Medications (Ophthalmic Drugs)  Medication Sig  . bacitracin-polymyxin b (POLYSPORIN) ophthalmic ointment Place into the right eye 4 (four) times daily. Place a 1/2 inch ribbon of ointment into the lower eyelid.  . prednisoLONE acetate (PRED FORTE) 1 % ophthalmic suspension Place 1 drop into the right eye 4 (four) times daily.   No current facility-administered medications for this visit.  (Ophthalmic Drugs)   Current Outpatient Medications (Other)  Medication Sig  . albuterol (PROVENTIL HFA;VENTOLIN HFA) 108 (90 BASE) MCG/ACT inhaler Inhale 2 puffs into the lungs every 6 (six) hours as needed for wheezing (wheezing).  (Patient taking differently: Inhale 2 puffs into the lungs every 6 (six) hours as needed for wheezing. )  . benzonatate (TESSALON) 100 MG capsule Take 100 mg by mouth every 8 (eight) hours as needed for cough.   . divalproex (DEPAKOTE SPRINKLES) 125 MG capsule Take 1 capsule at bedtime (Patient taking differently: Take 125 mg by mouth at bedtime. )  . enalapril (VASOTEC) 20 MG tablet Take 1 tablet (20 mg total) by mouth daily. (Patient not taking: Reported on 09/10/2018)  . ferrous sulfate 325 (65 FE) MG tablet Take 1 tablet (325 mg total) by mouth 2 (two) times daily with a meal. (Patient not taking: Reported on 09/10/2018)  . fluticasone (FLONASE) 50 MCG/ACT nasal spray Place 2 sprays into both nostrils daily as needed for allergies.   . hydrochlorothiazide (HYDRODIURIL) 25 MG tablet Take 1 tablet (25 mg total) by mouth daily. (Patient not taking: Reported on 09/10/2018)  . HYDROcodone-acetaminophen (NORCO/VICODIN) 5-325 MG tablet Take 1 tablet by mouth every 4 (four) hours as needed for moderate pain.  Marland Kitchen ibuprofen (ADVIL,MOTRIN) 800 MG tablet Take 1 tablet (800 mg total) by mouth every 8 (eight) hours as needed for moderate pain. (Patient taking differently: Take 800 mg by mouth 3 (three) times daily. )  . levETIRAcetam (KEPPRA) 500 MG tablet Take 1 tablet (500 mg total) by mouth 2 (two) times daily. (Patient not taking: Reported on 09/10/2018)  . ondansetron (ZOFRAN) 4 MG tablet Take 1 tablet (  4 mg total) by mouth every 6 (six) hours. (Patient not taking: Reported on 09/10/2018)  . oxyCODONE-acetaminophen (ROXICET) 5-325 MG per tablet Take 1 tablet by mouth every 6 (six) hours as needed for moderate pain or severe pain. (Patient not taking: Reported on 09/10/2018)  . predniSONE (DELTASONE) 20 MG tablet Take 3 tabs on day 1, 2-1/2 tabs on day 2, 2 tabs on day 3, 1-1/2 tabs on day 4, 1 tab on day 5, 1/2 tab on days 6 and 7, then stop. (Patient not taking: Reported on 09/10/2018)  . tiZANidine (ZANAFLEX) 4 MG tablet  Take 4 mg by mouth 3 (three) times daily as needed for muscle spasms.   . Vitamin D, Ergocalciferol, (DRISDOL) 1.25 MG (50000 UT) CAPS capsule Take 50,000 Units by mouth every 7 (seven) days.    Current Facility-Administered Medications (Other)  Medication Route  . diphenhydrAMINE (BENADRYL) injection 25 mg Intramuscular  . metoCLOPramide (REGLAN) 10 mg in dextrose 5 % 50 mL IVPB Intramuscular      REVIEW OF SYSTEMS: ROS    Positive for: Neurological, Musculoskeletal, Eyes, Respiratory   Negative for: Constitutional, Gastrointestinal, Skin, Genitourinary, HENT, Endocrine, Cardiovascular, Psychiatric, Allergic/Imm, Heme/Lymph   Last edited by Laddie Aquas on 10/06/2018 10:52 AM. (History)       ALLERGIES Allergies  Allergen Reactions  . Shrimp [Shellfish Allergy] Anaphylaxis    Can eat other shellfish  . Codeine Other (See Comments)    GI upset  . Dust Mite Extract Other (See Comments)    Unknown reaction  . Mold Extract [Trichophyton] Other (See Comments)    Unknown reaction  . Penicillins Other (See Comments)    Has patient had a PCN reaction causing immediate rash, facial/tongue/throat swelling, SOB or lightheadedness with hypotension: No Has patient had a PCN reaction causing severe rash involving mucus membranes or skin necrosis: No Has patient had a PCN reaction that required hospitalization: Yes Has patient had a PCN reaction occurring within the last 10 years: No If all of the above answers are "NO", then may proceed with Cephalosporin use.  Unknown reaction; pt has gotten cefazolin  . Soap Other (See Comments)    Unknown reaction    PAST MEDICAL HISTORY Past Medical History:  Diagnosis Date  . Anxiety   . Asthma   . Back pain    unknown   . Cerebral palsy (HCC)   . Headache(784.0)    frequently  . Hypertension   . Liver disease    Denies 09/10/2018  . Low sodium diet   . Muscle spasm    takes Flexeril daily  . Neuromuscular disorder (HCC)     cerebral palsy  . Numbness    bilateral feet  . Peripheral edema    occasionally  . PONV (postoperative nausea and vomiting)   . Rhegmatogenous retinal detachment of both eyes   . Seizure disorder (HCC)   . Seizures (HCC)    last one in high school;takes Depakote daily  . Wears glasses    Past Surgical History:  Procedure Laterality Date  . CESAREAN SECTION  2007  . CESAREAN SECTION WITH BILATERAL TUBAL LIGATION Bilateral 03/03/2015   Procedure: CESAREAN SECTION WITH BILATERAL TUBAL LIGATION;  Surgeon: Willodean Rosenthal, MD;  Location: [facility] ORS;  Service: Obstetrics;  Laterality: Bilateral;  Requested 03/03/15 @ 3:30p  Ok per Cleo/Myra-TM  . GAS/FLUID EXCHANGE Right 09/11/2018   Procedure: Gas/Fluid Exchange RIGHT EYE;  Surgeon: Rennis Chris, MD;  Location: [facility] OR;  Service: Ophthalmology;  Laterality: Right;  C3F8  . LASER PHOTO ABLATION Left 09/11/2018   Procedure: LASER PHOTO ABLATION LEFT EYE;  Surgeon: Rennis Chris, MD;  Location: [facility] OR;  Service: Ophthalmology;  Laterality: Left;  . LEG SURGERY Bilateral   . PARS PLANA VITRECTOMY Right 09/11/2018   Procedure: PARS PLANA VITRECTOMY WITH 25 GAUGE RIGHT EYE;  Surgeon: Rennis Chris, MD;  [facility];  Service: Ophthalmology;  Laterality: Right;  . PERFLUORONE INJECTION Right 09/11/2018   Procedure: Perfluorone Injection RIGHT EYE;  Surgeon: Rennis Chris, MD;  Location: [facility] OR;  Service: Ophthalmology;  Laterality: Right;  . PHOTOCOAGULATION WITH LASER Right 09/11/2018   Procedure: Photocoagulation With Laser RIGHT EYE;  Surgeon: Rennis Chris, MD;  Location: [facility] OR;  Service: Ophthalmology;  Laterality: Right;  . SCLERAL BUCKLE Right 09/11/2018   Procedure: Scleral Buckle RIGHT EYE;  Surgeon: Rennis Chris, MD;  Location: [facility] OR;  Service: Ophthalmology;  Laterality: Right;  . TOOTH EXTRACTION N/A 02/16/2013   Procedure: EXTRACTION 16, 17, 32;  Surgeon: Georgia Lopes, DDS;  Location: [facility] OR;  Service: Oral Surgery;  Laterality: N/A;     FAMILY HISTORY Family History  Problem Relation Age of Onset  . Diabetes Maternal Grandfather   . Hypertension Maternal Grandfather     SOCIAL HISTORY Social History   Tobacco Use  . Smoking status: Former Smoker    Packs/day: 0.25    Years: 7.00    Pack years: 1.75    Types: Cigarettes    Last attempt to quit: 11/05/2014    Years since quitting: 3.9  . Smokeless tobacco: Never Used  Substance Use Topics  . Alcohol use: No    Alcohol/week: 0.0 standard drinks  . Drug use: Yes    Types: Marijuana    Comment: feb 2020 last use         OPHTHALMIC EXAM:  Base Eye Exam    Visual Acuity (Snellen - Linear)      Right Left   Dist cc 20/250 +1 20/25   Dist ph cc 20/70 -1 NI   Correction:  Glasses       Tonometry (Tonopen, 10:48 AM)      Right Left   Pressure 17 21       Pupils      Dark Light Shape React APD   Right 5 5 Round Minimal None   Left 5 5 Round Minimal None       Visual Fields (Counting fingers)      Left Right    Full    Restrictions  Partial outer superior temporal, inferior temporal, superior nasal, inferior nasal deficiencies       Extraocular Movement      Right Left    Full, Ortho Full, Ortho       Neuro/Psych    Oriented x3:  Yes   Mood/Affect:  Normal       Dilation    Both eyes:  1.0% Mydriacyl, 2.5% Phenylephrine @ 10:48 AM        Slit Lamp and Fundus Exam    Slit Lamp Exam      Right Left   Lids/Lashes Normal Normal   Conjunctiva/Sclera White and quiet, sutures dissolving White and quiet   Cornea 2-3+ diffuse Punctate epithelial erosions, ointment Clear; 1+ PEE   Anterior Chamber Deep and quiet deep; clear   Iris Round, dilated Round, dilated   Lens 1+ Nuclear sclerosis, 1+ Cortical cataract, 1+noncentral  Posterior subcapsular cataract 1+NSC; 1+CC   Vitreous post vitrectomy, gas bubble at  35-40% +PVD       Fundus Exam      Right Left   Disc Pink and Sharp Compact, trace Pallor   C/D Ratio 0.5 0.5   Macula Good  foveal reflex, IN IRF resolved, retina re-attached Flat, Good foveal reflex   Vessels attenuated Vascular attenuation, Tortuous   Periphery Retina attached over buckle, good buckle height, good 360 laser Focal RD from 1-2 oclock with retinal hole at 0130; focal detachment from 3-530 w/  3 IT tears; focal detachment from 0700-0730 w/ retinal hole at 0730. Patches of lattice degeneration, good 360 laser--posterior to all pathologies -- good laser around all breaks        Refraction    Wearing Rx      Sphere Cylinder Axis   Right -4.00 +1.50 127   Left -4.25 +0.75 061   Type:  SVL          IMAGING AND PROCEDURES  Imaging and Procedures for @  OCT, Retina - OU - Both Eyes       Right Eye Quality was borderline. Central Foveal Thickness: 322. Progression has improved. Findings include normal foveal contour, no IRF (Interval improvement in nasal SRF, retina re-attached).   Left Eye Central Foveal Thickness: 297. Progression has been stable. Findings include normal foveal contour, no SRF, no IRF.   Notes *Images captured and stored on drive  Diagnosis / Impression:  OD: Interval resolution of inferonasal SRF, retina re-attached OS: NFP, no IRF/SRF centrally   Clinical management:  See below  Abbreviations: NFP - Normal foveal profile. CME - cystoid macular edema. PED - pigment epithelial detachment. IRF - intraretinal fluid. SRF - subretinal fluid. EZ - ellipsoid zone. ERM - epiretinal membrane. ORA - outer retinal atrophy. ORT - outer retinal tubulation. SRHM - subretinal hyper-reflective material                 ASSESSMENT/PLAN:    ICD-10-CM   1. 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 Retinal edema H35.81 OCT, Retina - OU - Both Eyes  4. Posterior vitreous detachment of both eyes H43.813   5. Nuclear sclerosis of both eyes H25.13   6. 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  Bilateral retinal detachments -- OD > OS  - both with fovea on, OD with mild inf nasal macular involvement  - pt subjectively reports vision issues as far back as "years ago"  - both eyes suggestive of chronic detachments  - OD with extensive nasal detachment with subretinal bands and early PVR  - OS with more peripheral focal rhegmatogenous retinal detachments with some pigmented demarcation lines and limited posterior extension - OD now POW3 s/p SBP + PPV/PFC/EL/FAX/14% C3F8 OD, 2.06.20             - doing well             - retina attached and in good position -- good buckle height and laser around breaks             - IOP okay at 17  - gas bubble at 35-40%  - OCT shows resolution of SRF -- retina nicely attached             - cont   PF 4x/day OD - decrease to TID  Cosopt BID OD - decrease to Qdaily                         PSO ung QID OD- only at bedtime  - add Soothe QID OD             - can d/c face down positioning; avoid laying flat on back             - post op drop and positioning instructions reviewed             - tylenol/ibuprofen for pain             - Rx given for breakthrough pain - OS now POW3 s/p 360 barricade laser retinopexy OS, 2.06.2020  - good laser changes in place  - will continue to monitor for progression - f/u 4 wks  3. Lattice degeneration w/ atrophic holes, both eyes  - significant patches of lattice w/ atrophic holes related to extensive RDs OU as described above  - discussed findings, prognosis, and treatment options  - lattice addressed during surgery above -- good laser in place  4. PVD OS  5. Early cataract OU  - The symptoms of cataract, surgical options, and treatments and risks were discussed with patient.  - discussed diagnosis and progression and likelihood of cataract progression OD following SBP/PPV OD  - OD with improved PC feathering today  - monitor for now  6. History of myopia w/ astigmatism  OU  - discussed increased incidence of RT/RD with myopia    Ophthalmic Meds Ordered this visit:  No orders of the defined types were placed in this encounter.     Return in about 4 weeks (around 11/03/2018) for f/u RD OD.  There are no Patient Instructions on file for this visit.   Explained the diagnoses, plan, and follow up with the patient and they expressed understanding.  Patient expressed understanding of the importance of proper follow up care.   This document serves as a record of services personally performed by Karie Chimera, MD, PhD. It was created on their behalf by Laurian Brim, OA, an ophthalmic assistant. The creation of this record is the provider's dictation and/or activities during the visit.    Electronically signed by: Laurian Brim, OA  02.29.2020 1:21 PM    Karie Chimera, M.D., Ph.D. Diseases & Surgery of the Retina and Vitreous [facility]   I have reviewed the above documentation for accuracy and completeness, and I agree with the above. Karie Chimera, M.D., Ph.D. 10/06/18 1:21 PM    Abbreviations: M myopia (nearsighted); A astigmatism; H hyperopia (farsighted); P presbyopia; Mrx spectacle prescription;  CTL contact lenses; OD right eye; OS left eye; OU both eyes  XT exotropia; ET esotropia; PEK punctate epithelial keratitis; PEE punctate epithelial erosions; DES dry eye syndrome; MGD meibomian gland dysfunction; ATs artificial tears; PFAT's preservative free artificial tears; NSC nuclear sclerotic cataract; PSC posterior subcapsular cataract; ERM epi-retinal membrane; PVD posterior vitreous detachment; RD retinal detachment; DM diabetes mellitus; DR diabetic retinopathy; NPDR non-proliferative diabetic retinopathy; PDR proliferative diabetic retinopathy; CSME clinically significant macular edema; DME diabetic macular edema; dbh dot blot hemorrhages; CWS cotton wool spot; POAG primary open angle glaucoma; C/D cup-to-disc ratio; HVF  humphrey visual field; GVF goldmann visual field; OCT optical coherence tomography; IOP intraocular pressure; BRVO Branch retinal vein occlusion; CRVO central retinal vein occlusion; CRAO central retinal artery occlusion; BRAO branch retinal artery occlusion;  RT retinal tear; SB scleral buckle; PPV pars plana vitrectomy; VH Vitreous hemorrhage; PRP panretinal laser photocoagulation; IVK intravitreal kenalog; VMT vitreomacular traction; MH Macular hole;  NVD neovascularization of the disc; NVE neovascularization elsewhere; AREDS age related eye disease study; ARMD age related macular degeneration; POAG primary open angle glaucoma; EBMD epithelial/anterior basement membrane dystrophy; ACIOL anterior chamber intraocular lens; IOL intraocular lens; PCIOL posterior chamber intraocular lens; Phaco/IOL phacoemulsification with intraocular lens placement; [place] photorefractive keratectomy; LASIK laser assisted in situ keratomileusis; HTN hypertension; DM diabetes mellitus; COPD chronic obstructive pulmonary disease

## 2018-10-06 ENCOUNTER — Encounter (INDEPENDENT_AMBULATORY_CARE_PROVIDER_SITE_OTHER): Payer: Self-pay | Admitting: Ophthalmology

## 2018-10-06 ENCOUNTER — Encounter (INDEPENDENT_AMBULATORY_CARE_PROVIDER_SITE_OTHER): Payer: Medicare Other | Admitting: Ophthalmology

## 2018-10-06 ENCOUNTER — Ambulatory Visit (INDEPENDENT_AMBULATORY_CARE_PROVIDER_SITE_OTHER): Payer: Medicare Other | Admitting: Ophthalmology

## 2018-10-06 DIAGNOSIS — H35413 Lattice degeneration of retina, bilateral: Secondary | ICD-10-CM

## 2018-10-06 DIAGNOSIS — H52203 Unspecified astigmatism, bilateral: Secondary | ICD-10-CM

## 2018-10-06 DIAGNOSIS — H5213 Myopia, bilateral: Secondary | ICD-10-CM

## 2018-10-06 DIAGNOSIS — H3323 Serous retinal detachment, bilateral: Secondary | ICD-10-CM

## 2018-10-06 DIAGNOSIS — H3581 Retinal edema: Secondary | ICD-10-CM | POA: Diagnosis not present

## 2018-10-06 DIAGNOSIS — H43813 Vitreous degeneration, bilateral: Secondary | ICD-10-CM

## 2018-10-06 DIAGNOSIS — H2513 Age-related nuclear cataract, bilateral: Secondary | ICD-10-CM

## 2018-10-17 ENCOUNTER — Other Ambulatory Visit (INDEPENDENT_AMBULATORY_CARE_PROVIDER_SITE_OTHER): Payer: Self-pay

## 2018-10-17 ENCOUNTER — Encounter (INDEPENDENT_AMBULATORY_CARE_PROVIDER_SITE_OTHER): Payer: Medicare Other | Admitting: Ophthalmology

## 2018-10-17 MED ORDER — DORZOLAMIDE HCL-TIMOLOL MAL 2-0.5 % OP SOLN: 1.0000 [drp] | Freq: Two times a day (BID) | OPHTHALMIC | 1 refills | Status: DC

## 2018-11-12 ENCOUNTER — Encounter (INDEPENDENT_AMBULATORY_CARE_PROVIDER_SITE_OTHER): Payer: Medicare Other | Admitting: Ophthalmology

## 2018-11-14 ENCOUNTER — Ambulatory Visit (INDEPENDENT_AMBULATORY_CARE_PROVIDER_SITE_OTHER): Payer: Medicare Other | Admitting: Ophthalmology

## 2018-11-14 ENCOUNTER — Other Ambulatory Visit: Payer: Self-pay

## 2018-11-14 ENCOUNTER — Encounter (INDEPENDENT_AMBULATORY_CARE_PROVIDER_SITE_OTHER): Payer: Self-pay | Admitting: Ophthalmology

## 2018-11-14 DIAGNOSIS — H3581 Retinal edema: Secondary | ICD-10-CM | POA: Diagnosis not present

## 2018-11-14 DIAGNOSIS — H2513 Age-related nuclear cataract, bilateral: Secondary | ICD-10-CM

## 2018-11-14 DIAGNOSIS — H5213 Myopia, bilateral: Secondary | ICD-10-CM

## 2018-11-14 DIAGNOSIS — H35413 Lattice degeneration of retina, bilateral: Secondary | ICD-10-CM

## 2018-11-14 DIAGNOSIS — H43812 Vitreous degeneration, left eye: Secondary | ICD-10-CM

## 2018-11-14 DIAGNOSIS — H3323 Serous retinal detachment, bilateral: Secondary | ICD-10-CM

## 2018-11-14 DIAGNOSIS — H52203 Unspecified astigmatism, bilateral: Secondary | ICD-10-CM

## 2018-11-14 NOTE — Progress Notes (Addendum)
[facility] - Clinic Note  11/14/2018     CHIEF COMPLAINT Patient presents for Post-op Follow-up   HISTORY OF PRESENT ILLNESS: Dana Parks is a 33 y.o. female who presents to the clinic today for:   HPI    Post-op Follow-up    In both eyes.  Discomfort includes pain and discharge.  Vision is improved.  I, the attending physician,  performed the HPI with the patient and updated documentation appropriately.          Comments    33 y/o female pt here for 2 mo p.o.  S/p SBP+PPV OD 02.06.20 and s/p 360 laser retinopexy 02.06.20 OS.  VA seems a little better OD.  No change in [place] OS.  Hard to tell if gas bubble is still present or not OD.  Has occasional minor pain OD about once or twice a week that is accompanied by a sensation of pressure, but eases with use of gtts.  Denies flashes, floaters, but has frequent white discharge OU.  Eyes often feel dry.  PF TID OD Cosopt QD OD PSO QHS OD Soothe QID OU       Last edited by Rennis Chris, MD on 11/14/2018  1:54 PM. (History)    pt states she is using drops as directed, she states her gas bubble disappeared last week  Referring physician: Fleet Contras, MD [address] [place], [place] [address]  HISTORICAL INFORMATION:   Selected notes from the MEDICAL RECORD NUMBER Referred by Dr. Aura Camps for concern of BRAO OD LEE: 01.14.20 (MKarleen Hampshire) [BCVA: OD: 20/30- OS: 20/25 Ocular Hx- PMH-anxiety, arthritis, cerebral palsy, headaches, seizures    CURRENT MEDICATIONS: Current Outpatient Medications (Ophthalmic Drugs)  Medication Sig  . bacitracin-polymyxin b (POLYSPORIN) ophthalmic ointment Place into the right eye 4 (four) times daily. Place a 1/2 inch ribbon of ointment into the lower eyelid.  Marland Kitchen dorzolamide-timolol (COSOPT) 22.3-6.8 MG/ML ophthalmic solution Place 1 drop into the right eye 2 (two) times daily.  . prednisoLONE acetate (PRED FORTE) 1 % ophthalmic suspension Place 1 drop into the right  eye 4 (four) times daily.   No current facility-administered medications for this visit.  (Ophthalmic Drugs)   Current Outpatient Medications (Other)  Medication Sig  . albuterol (PROVENTIL HFA;VENTOLIN HFA) 108 (90 BASE) MCG/ACT inhaler Inhale 2 puffs into the lungs every 6 (six) hours as needed for wheezing (wheezing). (Patient taking differently: Inhale 2 puffs into the lungs every 6 (six) hours as needed for wheezing. )  . benzonatate (TESSALON) 100 MG capsule Take 100 mg by mouth every 8 (eight) hours as needed for cough.   . divalproex (DEPAKOTE SPRINKLES) 125 MG capsule Take 1 capsule at bedtime (Patient taking differently: Take 125 mg by mouth at bedtime. )  . enalapril (VASOTEC) 20 MG tablet Take 1 tablet (20 mg total) by mouth daily. (Patient not taking: Reported on 09/10/2018)  . ferrous sulfate 325 (65 FE) MG tablet Take 1 tablet (325 mg total) by mouth 2 (two) times daily with a meal. (Patient not taking: Reported on 09/10/2018)  . fluticasone (FLONASE) 50 MCG/ACT nasal spray Place 2 sprays into both nostrils daily as needed for allergies.   . hydrochlorothiazide (HYDRODIURIL) 25 MG tablet Take 1 tablet (25 mg total) by mouth daily. (Patient not taking: Reported on 09/10/2018)  . HYDROcodone-acetaminophen (NORCO/VICODIN) 5-325 MG tablet Take 1 tablet by mouth every 4 (four) hours as needed for moderate pain.  Marland Kitchen ibuprofen (ADVIL,MOTRIN) 800 MG tablet Take 1  tablet (800 mg total) by mouth every 8 (eight) hours as needed for moderate pain. (Patient taking differently: Take 800 mg by mouth 3 (three) times daily. )  . levETIRAcetam (KEPPRA) 500 MG tablet Take 1 tablet (500 mg total) by mouth 2 (two) times daily. (Patient not taking: Reported on 09/10/2018)  . ondansetron (ZOFRAN) 4 MG tablet Take 1 tablet (4 mg total) by mouth every 6 (six) hours. (Patient not taking: Reported on 09/10/2018)  . oxyCODONE-acetaminophen (ROXICET) 5-325 MG per tablet Take 1 tablet by mouth every 6 (six) hours as needed  for moderate pain or severe pain. (Patient not taking: Reported on 09/10/2018)  . predniSONE (DELTASONE) 20 MG tablet Take 3 tabs on day 1, 2-1/2 tabs on day 2, 2 tabs on day 3, 1-1/2 tabs on day 4, 1 tab on day 5, 1/2 tab on days 6 and 7, then stop. (Patient not taking: Reported on 09/10/2018)  . tiZANidine (ZANAFLEX) 4 MG tablet Take 4 mg by mouth 3 (three) times daily as needed for muscle spasms.   . Vitamin D, Ergocalciferol, (DRISDOL) 1.25 MG (50000 UT) CAPS capsule Take 50,000 Units by mouth every 7 (seven) days.    Current Facility-Administered Medications (Other)  Medication Route  . diphenhydrAMINE (BENADRYL) injection 25 mg Intramuscular  . metoCLOPramide (REGLAN) 10 mg in dextrose 5 % 50 mL IVPB Intramuscular      REVIEW OF SYSTEMS: ROS    Positive for: Neurological, Musculoskeletal, Eyes   Negative for: Constitutional, Gastrointestinal, Skin, Genitourinary, HENT, Endocrine, Cardiovascular, Respiratory, Psychiatric, Allergic/Imm, Heme/Lymph   Last edited by Celine Mans, COA on 11/14/2018  1:03 PM. (History)       ALLERGIES Allergies  Allergen Reactions  . Shrimp [Shellfish Allergy] Anaphylaxis    Can eat other shellfish  . Codeine Other (See Comments)    GI upset  . Dust Mite Extract Other (See Comments)    Unknown reaction  . Mold Extract [Trichophyton] Other (See Comments)    Unknown reaction  . Penicillins Other (See Comments)    Has patient had a PCN reaction causing immediate rash, facial/tongue/throat swelling, SOB or lightheadedness with hypotension: No Has patient had a PCN reaction causing severe rash involving mucus membranes or skin necrosis: No Has patient had a PCN reaction that required hospitalization: Yes Has patient had a PCN reaction occurring within the last 10 years: No If all of the above answers are "NO", then may proceed with Cephalosporin use.  Unknown reaction; pt has gotten cefazolin  . Soap Other (See Comments)    Unknown reaction     PAST MEDICAL HISTORY Past Medical History:  Diagnosis Date  . Anxiety   . Asthma   . Back pain    unknown   . Cerebral palsy (HCC)   . Headache(784.0)    frequently  . Hypertension   . Liver disease    Denies 09/10/2018  . Low sodium diet   . Muscle spasm    takes Flexeril daily  . Neuromuscular disorder (HCC)    cerebral palsy  . Numbness    bilateral feet  . Peripheral edema    occasionally  . PONV (postoperative nausea and vomiting)   . Rhegmatogenous retinal detachment of both eyes   . Seizure disorder (HCC)   . Seizures (HCC)    last one in high school;takes Depakote daily  . Wears glasses    Past Surgical History:  Procedure Laterality Date  . CESAREAN SECTION  2007  . CESAREAN SECTION WITH BILATERAL TUBAL  LIGATION Bilateral 03/03/2015   Procedure: CESAREAN SECTION WITH BILATERAL TUBAL LIGATION;  Surgeon: Willodean Rosenthal, MD;  Location: [facility] ORS;  Service: Obstetrics;  Laterality: Bilateral;  Requested 03/03/15 @ 3:30p  Ok per Cleo/Myra-TM  . GAS/FLUID EXCHANGE Right 09/11/2018   Procedure: Gas/Fluid Exchange RIGHT EYE;  Surgeon: Rennis Chris, MD;  Location: [facility] OR;  Service: Ophthalmology;  Laterality: Right;  C3F8  . LASER PHOTO ABLATION Left 09/11/2018   Procedure: LASER PHOTO ABLATION LEFT EYE;  Surgeon: Rennis Chris, MD;  Location: [facility] OR;  Service: Ophthalmology;  Laterality: Left;  . LEG SURGERY Bilateral   . PARS PLANA VITRECTOMY Right 09/11/2018   Procedure: PARS PLANA VITRECTOMY WITH 25 GAUGE RIGHT EYE;  Surgeon: Rennis Chris, MD;  Location: [facility] OR;  Service: Ophthalmology;  Laterality: Right;  . PERFLUORONE INJECTION Right 09/11/2018   Procedure: Perfluorone Injection RIGHT EYE;  Surgeon: Rennis Chris, MD;  Location: [facility] OR;  Service: Ophthalmology;  Laterality: Right;  . PHOTOCOAGULATION WITH LASER Right 09/11/2018   Procedure: Photocoagulation With Laser RIGHT EYE;  Surgeon: Rennis Chris, MD;  Location: [facility] OR;  Service: Ophthalmology;  Laterality: Right;   . SCLERAL BUCKLE Right 09/11/2018   Procedure: Scleral Buckle RIGHT EYE;  Surgeon: Rennis Chris, MD;  Location: [facility] OR;  Service: Ophthalmology;  Laterality: Right;  . TOOTH EXTRACTION N/A 02/16/2013   Procedure: EXTRACTION 16, 17, 32;  Surgeon: Georgia Lopes, DDS;  Location: [facility] OR;  Service: Oral Surgery;  Laterality: N/A;    FAMILY HISTORY Family History  Problem Relation Age of Onset  . Diabetes Maternal Grandfather   . Hypertension Maternal Grandfather     SOCIAL HISTORY Social History   Tobacco Use  . Smoking status: Former Smoker    Packs/day: 0.25    Years: 7.00    Pack years: 1.75    Types: Cigarettes    Last attempt to quit: 11/05/2014    Years since quitting: 4.0  . Smokeless tobacco: Never Used  Substance Use Topics  . Alcohol use: No    Alcohol/week: 0.0 standard drinks  . Drug use: Yes    Types: Marijuana    Comment: feb 2020 last use         OPHTHALMIC EXAM:  Base Eye Exam    Visual Acuity (Snellen - Linear)      Right Left   Dist cc 20/200 - 20/25   Dist ph cc 20/100 - NI   Correction:  Glasses       Tonometry (Tonopen, 1:07 PM)      Right Left   Pressure 15 14       Pupils      Dark Light Shape React APD   Right 5 5 Round Minimal None   Left 5 5 Round Minimal None       Visual Fields (Counting fingers)      Left Right    Full    Restrictions  Partial outer superior temporal, inferior temporal, superior nasal, inferior nasal deficiencies       Extraocular Movement      Right Left    Full, Ortho Full, Ortho       Neuro/Psych    Oriented x3:  Yes   Mood/Affect:  Normal       Dilation    Both eyes:  1.0% Mydriacyl, 2.5% Phenylephrine @ 1:07 PM        Slit Lamp and Fundus Exam    Slit Lamp Exam      Right Left   Lids/Lashes  Normal Normal   Conjunctiva/Sclera White and quiet mild Melanosis   Cornea 2-3+ diffuse Punctate epithelial erosions, ointment 1-2+ PEE   Anterior Chamber Deep and quiet deep; clear   Iris Round, dilated  Round, dilated   Lens 1+ Nuclear sclerosis, 1+ Cortical cataract, 1+ Posterior subcapsular cataract 1+NSC; 1+CC   Vitreous post vitrectomy +PVD       Fundus Exam      Right Left   Disc Pink and Sharp Compact, trace Pallor   C/D Ratio 0.5 0.5   Macula Good foveal reflex, No heme or edema Flat, Good foveal reflex   Vessels attenuated, Tortuous, sclerotic vessels nasal periphery Vascular attenuation, Tortuous   Periphery Retina attached over buckle, good buckle height, good 360 laser Focal RD from 1-2 oclock with retinal hole at 0130; focal detachment from 3-530 w/  3 IT tears; focal detachment from 0700-0730 w/ retinal hole at 0730. Patches of lattice degeneration, good 360 laser--posterior to all pathologies -- good laser around all breaks          IMAGING AND PROCEDURES  Imaging and Procedures for @  OCT, Retina - OU - Both Eyes       Right Eye Quality was good. Central Foveal Thickness: 322. Progression has been stable. Findings include normal foveal contour, no IRF, no SRF (retina re-attached).   Left Eye Central Foveal Thickness: 298. Progression has been stable. Findings include normal foveal contour, no SRF, no IRF.   Notes *Images captured and stored on drive  Diagnosis / Impression:  OD: retina re-attached OS: NFP, no IRF/SRF   Clinical management:  See below  Abbreviations: NFP - Normal foveal profile. CME - cystoid macular edema. PED - pigment epithelial detachment. IRF - intraretinal fluid. SRF - subretinal fluid. EZ - ellipsoid zone. ERM - epiretinal membrane. ORA - outer retinal atrophy. ORT - outer retinal tubulation. SRHM - subretinal hyper-reflective material                 ASSESSMENT/PLAN:    ICD-10-CM   1. 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 Posterior vitreous detachment of left eye H43.812   5. Nuclear sclerosis of both eyes  H25.13   6. 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 Bilateral retinal detachments -- OD > OS  - both with fovea on, OD with mild inf nasal macular involvement  - pt subjectively reports vision issues as far back as "years ago"  - both eyes suggestive of chronic detachments  - OD with extensive nasal detachment with subretinal bands and early PVR  - OS with more peripheral focal rhegmatogenous retinal detachments with some pigmented demarcation lines and limited posterior extension - OD now POM2 s/p SBP + PPV/PFC/EL/FAX/14% C3F8 OD, 2.06.20             - doing well             - retina attached and in good position -- good buckle height and laser around breaks             - IOP okay at 15  - gas bubble now gone -- can remove green gas bracelet  - OCT shows retina nicely attached             - cont PF taper -- 2,1 drops daily, decreaseevery  weekly  -  Cosopt BID OD - okay to stop             - PSO ung QID OD- okay to stop  - cont Soothe QID OD  - mild PSC remains post RD repair  - BCVA 20/100 - OS now POM2 s/p 360 barricade laser retinopexy OS, 2.06.2020  - good laser changes in place  - will continue to monitor for progression - f/u 8 wks  3. Lattice degeneration w/ atrophic holes, both eyes  - significant patches of lattice w/ atrophic holes related to extensive RDs OU as described above  - discussed findings, prognosis, and treatment options  - lattice addressed during surgery above -- good laser in place  4. PVD OS  5. Early cataract OU  - The symptoms of cataract, surgical options, and treatments and risks were discussed with patient.  - discussed diagnosis and progression and likelihood of cataract progression OD following SBP/PPV OD  - OD with residual PSC  - monitor  6. History of myopia w/ astigmatism OU  - discussed increased incidence of RT/RD with myopia    Ophthalmic Meds Ordered this visit:  No orders of the defined types were placed  in this encounter.     Return in about 8 weeks (around 01/09/2019) for f/u RD OU, DFE, OCT.  There are no Patient Instructions on file for this visit.   Explained the diagnoses, plan, and follow up with the patient and they expressed understanding.  Patient expressed understanding of the importance of proper follow up care.   This document serves as a record of services personally performed by Karie ChimeraBrian G. Takeysha Bonk, MD, PhD. It was created on their behalf by Laurian BrimAmanda Brown, OA, an ophthalmic assistant. The creation of this record is the provider's dictation and/or activities during the visit.    Electronically signed by: Laurian BrimAmanda Brown, OA  04.10.2020 2:57 PM    Karie ChimeraBrian G. Kymari Lollis, M.D., Ph.D. Diseases & Surgery of the Retina and Vitreous [facility]  I have reviewed the above documentation for accuracy and completeness, and I agree with the above. Karie ChimeraBrian G. Vint Pola, M.D., Ph.D. 11/14/18 2:57 PM    Abbreviations: M myopia (nearsighted); A astigmatism; H hyperopia (farsighted); P presbyopia; Mrx spectacle prescription;  CTL contact lenses; OD right eye; OS left eye; OU both eyes  XT exotropia; ET esotropia; PEK punctate epithelial keratitis; PEE punctate epithelial erosions; DES dry eye syndrome; MGD meibomian gland dysfunction; ATs artificial tears; PFAT's preservative free artificial tears; NSC nuclear sclerotic cataract; PSC posterior subcapsular cataract; ERM epi-retinal membrane; PVD posterior vitreous detachment; RD retinal detachment; DM diabetes mellitus; DR diabetic retinopathy; NPDR non-proliferative diabetic retinopathy; PDR proliferative diabetic retinopathy; CSME clinically significant macular edema; DME diabetic macular edema; dbh dot blot hemorrhages; CWS cotton wool spot; POAG primary open angle glaucoma; C/D cup-to-disc ratio; HVF humphrey visual field; GVF goldmann visual field; OCT optical coherence tomography; IOP intraocular pressure; BRVO Branch retinal vein  occlusion; CRVO central retinal vein occlusion; CRAO central retinal artery occlusion; BRAO branch retinal artery occlusion; RT retinal tear; SB scleral buckle; PPV pars plana vitrectomy; VH Vitreous hemorrhage; PRP panretinal laser photocoagulation; IVK intravitreal kenalog; VMT vitreomacular traction; MH Macular hole;  NVD neovascularization of the disc; NVE neovascularization elsewhere; AREDS age related eye disease study; ARMD age related macular degeneration; POAG primary open angle glaucoma; EBMD epithelial/anterior basement membrane dystrophy; ACIOL anterior chamber intraocular lens; IOL intraocular lens; PCIOL posterior chamber intraocular lens; Phaco/IOL phacoemulsification with intraocular lens placement; PRK photorefractive keratectomy; LASIK  laser assisted in situ keratomileusis; HTN hypertension; DM diabetes mellitus; COPD chronic obstructive pulmonary disease

## 2019-01-09 ENCOUNTER — Encounter (INDEPENDENT_AMBULATORY_CARE_PROVIDER_SITE_OTHER): Payer: Medicare Other | Admitting: Ophthalmology

## 2019-02-17 ENCOUNTER — Telehealth: Payer: Self-pay

## 2019-02-17 NOTE — Telephone Encounter (Signed)
Received refill request from [facility] for Divalproex 125mg  sprinkles. 1 cap qhs.  Faxed back pharmacy with message stating pt needs to schedule an OV due to pt not being seen since 01/2017. Refill denied.

## 2020-03-02 NOTE — Progress Notes (Deleted)
[id number]    Provider:  Dr Lucia GaskinsAhern Requesting Provider: Fleet ContrasAvbuere, Edwin, MD Primary Care Provider:  Fleet ContrasAvbuere, Edwin, MD  CC:  ***  HPI:  Dana Parks is a 34 y.o. female here as requested by Fleet ContrasAvbuere, Edwin, MD for ***.  ***.  Past medical history cerebral palsy, asthma, seizure disorder, anxiety, incontinence of urine, anemia of chronic disease, lumbago, hypertension.  I reviewed Dr. Albertina ParrAvbuere's notes and patient was last seen complaining of headache, dizziness and loss of sleep.  Every day smoker 2 cigars/day, 1-pack-year history, she had an ophthalmological exam on September 09, 2018, patient referred to us for seizure disorder which is well controlled, on Keppra.  Patient was last seen in 2016 when she was pregnant.  At that time she said she had seizures every "once in a while", as a child sounded like she had generalized tonic-clonic seizures, seizures start with vision changes and passing out, she does not remember what happens during her loss of consciousness, she was a poor historian and difficult to understand her history and current seizure activity.  At the time she was not taking Depakote and had a seizure the prior week.  She was started on Keppra at the time.  Reviewed notes, labs and imaging from outside physicians, which showed ***  MRI brain 12/21/2016: BRAIN: No reduced diffusion to suggest acute ischemia, hyperacute demyelination or hypercellular tumor. No susceptibility artifact to suggest hemorrhage. Thinned appearance of the posterior corpus callosum body, with symmetric undulating squared appearance of the occipital horns and, periventricular FLAIR T2 hyperintense signal posteriorly. No hydrocephalus . No suspicious parenchymal signal, masses or mass effect. No abnormal extra-axial fluid collections.  VASCULAR: Normal major intracranial vascular flow voids present at skull base.  SKULL AND UPPER CERVICAL SPINE: No abnormal sellar expansion.  No suspicious calvarial bone marrow signal. Craniocervical junction maintained.  SINUSES/ORBITS: The mastoid air-cells and included paranasal sinuses are well-aerated. The included ocular globes and orbital contents are non-suspicious.  OTHER: None.  IMPRESSION: No acute intracranial process on this mildly motion degraded examination.  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    Review of Systems: Patient complains of symptoms per HPI as well as the following symptoms ***. Pertinent negatives and positives per HPI. All others negative.   Social History   Socioeconomic History  . Marital status: Single    Spouse name: Not on file  . Number of children: 1  . Years of education: high schoo  . Highest education level: Not on file  Occupational History  . Occupation: Disabled  Tobacco Use  . Smoking status: Former Smoker    Packs/day: 0.25    Years: 7.00    Pack years: 1.75    Types: Cigarettes    Quit date: 11/05/2014    Years since quitting: 5.3  . Smokeless tobacco: Never Used  Vaping Use  . Vaping Use: Never used  Substance and Sexual Activity  . Alcohol use: No    Alcohol/week: 0.0 standard drinks  . Drug use: Yes    Types: Marijuana    Comment: feb 2020 last use  . Sexual activity: Yes    Partners: Male    Birth control/protection: None  Other Topics Concern  . Not on file  Social History Narrative   Lives at home with her sister.   Single.    Has 2 children. Currently pregnant with 1.   Social Determinants of Health   Financial  Resource Strain:   . Difficulty of Paying Living Expenses:   Food Insecurity:   . Worried About Programme researcher, broadcasting/film/video in the Last Year:   . Barista in the Last Year:   Transportation Needs:   . Freight forwarder (Medical):   Marland Kitchen Lack of Transportation (Non-Medical):   Physical Activity:   . Days of  Exercise per Week:   . Minutes of Exercise per Session:   Stress:   . Feeling of Stress :   Social Connections:   . Frequency of Communication with Friends and Family:   . Frequency of Social Gatherings with Friends and Family:   . Attends Religious Services:   . Active Member of Clubs or Organizations:   . Attends Banker Meetings:   Marland Kitchen Marital Status:   Intimate Partner Violence:   . Fear of Current or Ex-Partner:   . Emotionally Abused:   Marland Kitchen Physically Abused:   . Sexually Abused:     Family History  Problem Relation Age of Onset  . Diabetes Maternal Grandfather   . Hypertension Maternal Grandfather     Past Medical History:  Diagnosis Date  . Anxiety   . Asthma   . Back pain    unknown   . Cerebral palsy (HCC)   . Headache(784.0)    frequently  . Hypertension   . Liver disease    Denies 09/10/2018  . Low sodium diet   . Muscle spasm    takes Flexeril daily  . Neuromuscular disorder (HCC)    cerebral palsy  . Numbness    bilateral feet  . Peripheral edema    occasionally  . PONV (postoperative nausea and vomiting)   . Rhegmatogenous retinal detachment of both eyes   . Seizure disorder (HCC)   . Seizures (HCC)    last one in high school;takes Depakote daily  . Wears glasses     Patient Active Problem List   Diagnosis Date Noted  . Intractable migraine with aura with status migrainosus 01/08/2017  . Post-operative state 03/03/2015  . 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  . Asthma, mild intermittent, well-controlled 12/13/2014  . Low lying placenta without hemorrhage, antepartum   . Previous cesarean section complicating pregnancy, antepartum condition or complication 09/09/2014  . Epilepsy affecting pregnancy, 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  . Asthma complicating pregnancy, antepartum 09/09/2014  . Cerebral palsy (HCC)   . Seizure disorder [facility])     Past Surgical History:  Procedure Laterality Date  . CESAREAN SECTION  2007  . CESAREAN SECTION WITH  BILATERAL TUBAL LIGATION Bilateral 03/03/2015   Procedure: CESAREAN SECTION WITH BILATERAL TUBAL LIGATION;  Surgeon: Willodean Rosenthal, MD;  Location: [facility] ORS;  Service: Obstetrics;  Laterality: Bilateral;  Requested 03/03/15 @ 3:30p  Ok per Cleo/Myra-TM  . GAS/FLUID EXCHANGE Right 09/11/2018   Procedure: Gas/Fluid Exchange RIGHT EYE;  Surgeon: Rennis Chris, MD;  Location[facility] OR;  Service: Ophthalmology;  Laterality: Right;  [id number]  . LASER PHOTO ABLATION Left 09/11/2018   Procedure: LASER PHOTO ABLATION LEFT EYE;  Surgeon: Rennis Chris, MD;  Location: [facility] OR;  Service: Ophthalmology;  Laterality: Left;  . LEG SURGERY Bilateral   . PARS PLANA VITRECTOMY Right 09/11/2018   Procedure: PARS PLANA VITRECTOMY WITH 25 GAUGE RIGHT EYE;  Surgeon: Rennis Chris, MD;  Location: [facility] OR;  Service: Ophthalmology;  Laterality: Right;  . PERFLUORONE INJECTION Right 09/11/2018   Procedure: Perfluorone Injection RIGHT EYE;  Surgeon: Rennis Chris, MD;  Location: [facility] OR;  Service: Ophthalmology;  Laterality: Right;  . PHOTOCOAGULATION WITH LASER Right 09/11/2018   Procedure: Photocoagulation With Laser RIGHT EYE;  Surgeon: Rennis Chris, MD;  Location: [facility] OR;  Service: Ophthalmology;  Laterality: Right;  . SCLERAL BUCKLE Right 09/11/2018   Procedure: Scleral Buckle RIGHT EYE;  Surgeon: Rennis Chris, MD;  Location: [facility] OR;  Service: Ophthalmology;  Laterality: Right;  . TOOTH EXTRACTION N/A 02/16/2013   Procedure: EXTRACTION 16, 17, 32;  Surgeon: Georgia Lopes, DDS;  Location: [facility] OR;  Service: Oral Surgery;  Laterality: N/A;    Current Outpatient Medications  Medication Sig Dispense Refill  . albuterol (PROVENTIL HFA;VENTOLIN HFA) 108 (90 BASE) MCG/ACT inhaler Inhale 2 puffs into the lungs every 6 (six) hours as needed for wheezing (wheezing). (Patient taking differently: Inhale 2 puffs into the lungs every 6 (six) hours as needed for wheezing. ) 1 Inhaler 5  . bacitracin-polymyxin b (POLYSPORIN) ophthalmic ointment Place  into the right eye 4 (four) times daily. Place a 1/2 inch ribbon of ointment into the lower eyelid. 3.5 g 3  . benzonatate (TESSALON) 100 MG capsule Take 100 mg by mouth every 8 (eight) hours as needed for cough.     . divalproex (DEPAKOTE SPRINKLES) 125 MG capsule Take 1 capsule at bedtime (Patient taking differently: Take 125 mg by mouth at bedtime. ) 30 capsule 6  . dorzolamide-timolol (COSOPT) 22.3-6.8 MG/ML ophthalmic solution Place 1 drop into the right eye 2 (two) times daily. 10 mL 1  . enalapril (VASOTEC) 20 MG tablet Take 1 tablet (20 mg total) by mouth daily. (Patient not taking: Reported on 09/10/2018) 30 tablet 1  . ferrous sulfate 325 (65 FE) MG tablet Take 1 tablet (325 mg total) by mouth 2 (two) times daily with a meal. (Patient not taking: Reported on 09/10/2018) 60 tablet 3  . fluticasone (FLONASE) 50 MCG/ACT nasal spray Place 2 sprays into both nostrils daily as needed for allergies.     . hydrochlorothiazide (HYDRODIURIL) 25 MG tablet Take 1 tablet (25 mg total) by mouth daily. (Patient not taking: Reported on 09/10/2018) 30 tablet 3  . ibuprofen (ADVIL,MOTRIN) 800 MG tablet Take 1 tablet (800 mg total) by mouth every 8 (eight) hours as needed for moderate pain. (Patient taking differently: Take 800 mg by mouth 3 (three) times daily. ) 21 tablet 0  . levETIRAcetam (KEPPRA) 500 MG tablet Take 1 tablet (500 mg total) by mouth 2 (two) times daily. (Patient not taking: Reported on 09/10/2018) 60 tablet 0  . ondansetron (ZOFRAN) 4 MG tablet Take 1 tablet (4 mg total) by mouth every 6 (six) hours. (Patient not taking: Reported on 09/10/2018) 12 tablet 0  . oxyCODONE-acetaminophen (ROXICET) 5-325 MG per tablet Take 1 tablet by mouth every 6 (six) hours as needed for moderate pain or severe pain. (Patient not taking: Reported on 09/10/2018) 30 tablet 0  . prednisoLONE acetate (PRED FORTE) 1 % ophthalmic suspension Place 1 drop into the right eye 4 (four) times daily. 15 mL 0  . predniSONE (DELTASONE) 20  MG tablet Take 3 tabs on day 1, 2-1/2 tabs on day 2, 2 tabs on day 3, 1-1/2 tabs on day 4, 1 tab on day 5, 1/2 tab on days 6 and 7, then stop. (Patient not taking: Reported on 09/10/2018) 11 tablet 0  . tiZANidine (ZANAFLEX) 4 MG tablet Take 4 mg by mouth 3 (three) times daily as needed for muscle spasms.     . Vitamin D, Ergocalciferol, (DRISDOL) 1.25 MG (50000 UT) CAPS capsule Take 50,000  Units by mouth every 7 (seven) days.      Current Facility-Administered Medications  Medication Dose Route Frequency Provider Last Rate Last Admin  . diphenhydrAMINE (BENADRYL) injection 25 mg  25 mg Intramuscular Once Van Clines, MD      . metoCLOPramide (REGLAN) 10 mg in dextrose 5 % 50 mL IVPB  10 mg Intramuscular Once Van Clines, MD        Allergies as of 03/03/2020 - Review Complete 11/14/2018  Allergen Reaction Noted  . Shrimp [shellfish allergy] Anaphylaxis 09/09/2014  . Codeine Other (See Comments) 11/03/2012  . Dust mite extract Other (See Comments) 11/03/2012  . Mold extract [trichophyton] Other (See Comments) 11/03/2012  . Penicillins Other (See Comments) 11/03/2012  . Soap Other (See Comments) 11/03/2012    Vitals: There were no vitals taken for this visit. Last Weight:  Wt Readings from Last 1 Encounters:  09/11/18 140 lb (63.5 kg)   Last Height:   Ht Readings from Last 1 Encounters:  09/11/18 4\' 11"  (1.499 m)     Physical exam: Exam: Gen: NAD, conversant, well nourised, obese, well groomed                     CV: RRR, no MRG. No Carotid Bruits. No peripheral edema, warm, nontender Eyes: Conjunctivae clear without exudates or hemorrhage  Neuro: Detailed Neurologic Exam  Speech:    Speech is normal; fluent and spontaneous with normal comprehension.  Cognition:    The patient is oriented to person, place, and time;     recent and remote memory intact;     language fluent;     normal attention, concentration,     fund of knowledge Cranial Nerves:    The pupils  are equal, round, and reactive to light. The fundi are normal and spontaneous venous pulsations are present. Visual fields are full to finger confrontation. Extraocular movements are intact. Trigeminal sensation is intact and the muscles of mastication are normal. The face is symmetric. The palate elevates in the midline. Hearing intact. Voice is normal. Shoulder shrug is normal. The tongue has normal motion without fasciculations.   Coordination:    Normal finger to nose and heel to shin. Normal rapid alternating movements.   Gait:    Heel-toe and tandem gait are normal.   Motor Observation:    No asymmetry, no atrophy, and no involuntary movements noted. Tone:    Normal muscle tone.    Posture:    Posture is normal. normal erect    Strength:    Strength is V/V in the upper and lower limbs.      Sensation: intact to LT     Reflex Exam:  DTR's:    Deep tendon reflexes in the upper and lower extremities are normal bilaterally.   Toes:    The toes are downgoing bilaterally.   Clonus:    Clonus is absent.    Assessment/Plan:    No orders of the defined types were placed in this encounter.  No orders of the defined types were placed in this encounter.   Cc: , MD,  Fleet Contras, MD  Fleet Contras, MD  [facility] [address] [place], [place]  Phone [phone] Fax [phone]

## 2020-03-03 ENCOUNTER — Encounter: Payer: Self-pay | Admitting: *Deleted

## 2020-03-03 ENCOUNTER — Institutional Professional Consult (permissible substitution): Payer: Medicare Other | Admitting: Neurology

## 2020-03-30 ENCOUNTER — Ambulatory Visit (INDEPENDENT_AMBULATORY_CARE_PROVIDER_SITE_OTHER): Payer: Medicare Other | Admitting: Neurology

## 2020-03-30 ENCOUNTER — Encounter: Payer: Self-pay | Admitting: Neurology

## 2020-03-30 ENCOUNTER — Other Ambulatory Visit: Payer: Self-pay

## 2020-03-30 VITALS — BP 151/97 | HR 92 | Ht 59.0 in | Wt 161.0 lb

## 2020-03-30 DIAGNOSIS — Z79899 Other long term (current) drug therapy: Secondary | ICD-10-CM

## 2020-03-30 DIAGNOSIS — G40909 Epilepsy, unspecified, not intractable, without status epilepticus: Secondary | ICD-10-CM

## 2020-03-30 MED ORDER — LEVETIRACETAM 1000 MG PO TABS: 1000.0000 mg | ORAL_TABLET | Freq: Two times a day (BID) | ORAL | 3 refills | Status: DC

## 2020-03-30 NOTE — Patient Instructions (Signed)
Restart Keppra (levetiracetam) at higher dose  Per [facility] statutes, patients with seizures are not allowed to drive until they have been seizure-free for six months.    Use caution when using heavy equipment or power tools. Avoid working on ladders or at heights. Take showers instead of baths. Ensure the water temperature is not too high on the home water heater. Do not go swimming alone. Do not lock yourself in a room alone (i.e. bathroom). When caring for infants or small children, sit down when holding, feeding, or changing them to minimize risk of injury to the child in the event you have a seizure. Maintain good sleep hygiene. Avoid alcohol.    If patient has another seizure, call 911 and bring them back to the ED if: A.  The seizure lasts longer than 5 minutes.      B.  The patient doesn't wake shortly after the seizure or has new problems such as difficulty seeing, speaking or moving following the seizure C.  The patient was injured during the seizure D.  The patient has a temperature over 102 F (39C) E.  The patient vomited during the seizure and now is having trouble breathing  Per [facility] statutes, patients with seizures are not allowed to drive until they have been seizure-free for six months.  Other recommendations include using caution when using heavy equipment or power tools. Avoid working on ladders or at heights. Take showers instead of baths.  Do not swim alone.  Ensure the water temperature is not too high on the home water heater. Do not go swimming alone. Do not lock yourself in a room alone (i.e. bathroom). When caring for infants or small children, sit down when holding, feeding, or changing them to minimize risk of injury to the child in the event you have a seizure. Maintain good sleep hygiene. Avoid alcohol.  Also recommend adequate sleep, hydration, good diet and minimize stress.  During the Seizure  - First, ensure adequate ventilation and place  patients on the floor on their left side  Loosen clothing around the neck and ensure the airway is patent. If the patient is clenching the teeth, do not force the mouth open with any object as this can cause severe damage - Remove all items from the surrounding that can be hazardous. The patient may be oblivious to what's happening and may not even know what he or she is doing. If the patient is confused and wandering, either gently guide him/her away and block access to outside areas - Reassure the individual and be comforting - Call 911. In most cases, the seizure ends before EMS arrives. However, there are cases when seizures may last over 3 to 5 minutes. Or the individual may have developed breathing difficulties or severe injuries. If a pregnant patient or a person with diabetes develops a seizure, it is prudent to call an ambulance. - Finally, if the patient does not regain full consciousness, then call EMS. Most patients will remain confused for about 45 to 90 minutes after a seizure, so you must use judgment in calling for help. - Avoid restraints but make sure the patient is in a bed with padded side rails - Place the individual in a lateral position with the neck slightly flexed; this will help the saliva drain from the mouth and prevent the tongue from falling backward - Remove all nearby furniture and other hazards from the area - Provide verbal assurance as the individual is regaining  consciousness - Provide the patient with privacy if possible - Call for help and start treatment as ordered by the caregiver   fter the Seizure (Postictal Stage)  After a seizure, most patients experience confusion, fatigue, muscle pain and/or a headache. Thus, one should permit the individual to sleep. For the next few days, reassurance is essential. Being calm and helping reorient the person is also of importance.  Most seizures are painless and end spontaneously. Seizures are not harmful to others but  can lead to complications such as stress on the lungs, brain and the heart. Individuals with prior lung problems may develop labored breathing and respiratory distress.   Levetiracetam tablets What is this medicine? LEVETIRACETAM (lee ve tye RA se tam) is an antiepileptic drug. It is used with other medicines to treat certain types of seizures. This medicine may be used for other purposes; ask your health care provider or pharmacist if you have questions. COMMON BRAND NAME(S): Keppra, Roweepra What should I tell my health care provider before I take this medicine? They need to know if you have any of these conditions:  kidney disease  suicidal thoughts, plans, or attempt; a previous suicide attempt by you or a family member  an unusual or allergic reaction to levetiracetam, other medicines, foods, dyes, or preservatives  pregnant or trying to get pregnant  breast-feeding How should I use this medicine? Take this medicine by mouth with a glass of water. Follow the directions on the prescription label. Swallow the tablets whole. Do not crush or chew this medicine. You may take this medicine with or without food. Take your doses at regular intervals. Do not take your medicine more often than directed. Do not stop taking this medicine or any of your seizure medicines unless instructed by your doctor or health care professional. Stopping your medicine suddenly can increase your seizures or their severity. A special MedGuide will be given to you by the pharmacist with each prescription and refill. Be sure to read this information carefully each time. Contact your pediatrician or health care professional regarding the use of this medication in children. While this drug may be prescribed for children as young as 69 years of age for selected conditions, precautions do apply. Overdosage: If you think you have taken too much of this medicine contact a poison control center or emergency room at  once. NOTE: This medicine is only for you. Do not share this medicine with others. What if I miss a dose? If you miss a dose, take it as soon as you can. If it is almost time for your next dose, take only that dose. Do not take double or extra doses. What may interact with this medicine? This medicine may interact with the following medications:  carbamazepine  colesevelam  probenecid  sevelamer This list may not describe all possible interactions. Give your health care provider a list of all the medicines, herbs, non-prescription drugs, or dietary supplements you use. Also tell them if you smoke, drink alcohol, or use illegal drugs. Some items may interact with your medicine. What should I watch for while using this medicine? Visit your doctor or health care provider for a regular check on your progress. Wear a medical identification bracelet or chain to say you have epilepsy, and carry a card that lists all your medications. This medicine may cause serious skin reactions. They can happen weeks to months after starting the medicine. Contact your health care provider right away if you notice fevers or flu-like  symptoms with a rash. The rash may be red or purple and then turn into blisters or peeling of the skin. Or, you might notice a red rash with swelling of the face, lips or lymph nodes in your neck or under your arms. It is important to take this medicine exactly as instructed by your health care provider. When first starting treatment, your dose may need to be adjusted. It may take weeks or months before your dose is stable. You should contact your doctor or health care provider if your seizures get worse or if you have any new types of seizures. You may get drowsy or dizzy. Do not drive, use machinery, or do anything that needs mental alertness until you know how this medicine affects you. Do not stand or sit up quickly, especially if you are an older patient. This reduces the risk of dizzy  or fainting spells. Alcohol may interfere with the effect of this medicine. Avoid alcoholic drinks. The use of this medicine may increase the chance of suicidal thoughts or actions. Pay special attention to how you are responding while on this medicine. Any worsening of mood, or thoughts of suicide or dying should be reported to your health care provider right away. Women who become pregnant while using this medicine may enroll in the [place] Antiepileptic Drug Pregnancy Registry by calling [phone]. This registry collects information about the safety of antiepileptic drug use during pregnancy. What side effects may I notice from receiving this medicine? Side effects that you should report to your doctor or health care professional as soon as possible:  allergic reactions like skin rash, itching or hives, swelling of the face, lips, or tongue  breathing problems  dark urine  general ill feeling or flu-like symptoms  problems with balance, talking, walking  rash, fever, and swollen lymph nodes  redness, blistering, peeling or loosening of the skin, including inside the mouth  unusually weak or tired  worsening of mood, thoughts or actions of suicide or dying  yellowing of the eyes or skin Side effects that usually do not require medical attention (report to your doctor or health care professional if they continue or are bothersome):  diarrhea  dizzy, drowsy  headache  loss of appetite This list may not describe all possible side effects. Call your doctor for medical advice about side effects. You may report side effects to FDA at 1-800-FDA-1088. Where should I keep my medicine? Keep out of reach of children. Store at room temperature between 15 and 30 degrees C (59 and 86 degrees F). Throw away any unused medicine after the expiration date. NOTE: This sheet is a summary. It may not cover all possible information. If you have questions about this medicine, talk to  your doctor, pharmacist, or health care provider.  2020 Elsevier/Gold Standard (2018-10-24 15:23:36)

## 2020-03-30 NOTE — Progress Notes (Signed)
[facility]    Provider:  Dr Lucia Gaskins Requesting Provider: Fleet Contras, MD Primary Care Provider:  Fleet Contras, MD  CC: Seizures  HPI:  Dana Parks is a 34 y.o. female here as requested by Fleet Contras, MD for seizures.  She has a past medical history of cerebral palsy, cognitive impairment, and a history of seizures, she has had seizures since a child, infancy, the history appears to be generalized tonic-clonic seizures, staring, loss of consciousness followed by convulsive activity, foaming at the mouth, denied any olfactory hallucinations dj vu rising epigastric sensation, focal numbness tingling weakness, myoclonic jerks she also has a history of migraines, she reports her seizures have always started with vision changes and "passing out", she loses her vision she passes out, she does remember what happens during her loss of consciousness, patient has been noncompliant in the past, poor historian, difficult to understand a complete seizure history and evolution of past and current seizure activity.  She has been in the emergency room and admitted multiple times for breakthrough seizures likely due to noncompliance.  Patient lives with her stepmother.  Very non-compliant patient here again for seizures. She saw me in 2016 and then saw Dr. Karel Jarvis at [facility] but never followed up with either of [place], she no showed or canceled multiple appointments at [facility] and now she is here today. She had an episode in 2019 that scared her so much she had to go to the hospital. I had a conversation about this with her.She states that she was taking her Keppra  twice daily but still had a seizure. She shows me an old bottle with pills left in it (filled 01/11/20 for 30 days) and she has not had any medication since then. She doesn't drive.   Reviewed notes, labs and imaging from outside physicians, which showed:  I reviewed Dr. 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  Her uncle has seizures.  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   Review of Systems: Patient complains of symptoms per HPI as well as the following symptoms: Seizures, headaches. Pertinent negatives and positives per HPI. All others negative.   Social History   Socioeconomic History  . Marital status: Single    Spouse name: Not on file  . Number of children: 3  . Years of education: high schoo  . Highest education level: Not on file  Occupational History  . Occupation: Disabled  Tobacco Use  . Smoking status: Current Every Day Smoker    Packs/day: 0.25    Years: 7.00    Pack years: 1.75    Types: Cigarettes    Last attempt to quit: 11/05/2014    Years since quitting: 5.4  . Smokeless tobacco: Never Used  . Tobacco comment: 1 cigar/day  Vaping Use  . Vaping Use: Never used  Substance and Sexual Activity  . Alcohol use: No    Alcohol/week: 0.0 standard drinks  . Drug use: Not Currently    Types:  Marijuana    Comment: feb 2020 last use  . Sexual activity: Yes    Partners: Male    Birth control/protection: Surgical  Other Topics Concern  . Not on file  Social History Narrative   Lives back and forth with sister and step mother.   Single.    Has 3 children   Social Determinants of Health   Financial Resource Strain:   . Difficulty of Paying Living Expenses: Not on file  Food Insecurity:    . Worried About Programme researcher, broadcasting/film/videounning Out of Food in the Last Year: Not on file  . Ran Out of Food in the Last Year: Not on file  Transportation Needs:   . Lack of Transportation (Medical): Not on file  . Lack of Transportation (Non-Medical): Not on file  Physical Activity:   . Days of Exercise per Week: Not on file  . Minutes of Exercise per Session: Not on file  Stress:   . Feeling of Stress : Not on file  Social Connections:   . Frequency of Communication with Friends and Family: Not on file  . Frequency of Social Gatherings with Friends and Family: Not on file  . Attends Religious Services: Not on file  . Active Member of Clubs or Organizations: Not on file  . Attends BankerClub or Organization Meetings: Not on file  . Marital Status: Not on file  Intimate Partner Violence:   . Fear of Current or Ex-Partner: Not on file  . Emotionally Abused: Not on file  . Physically Abused: Not on file  . Sexually Abused: Not on file    Family History  Problem Relation Age of Onset  . Diabetes Maternal Grandfather   . Hypertension Maternal Grandfather   . High blood pressure Mother   . High blood pressure Other        mother's side   . Seizures Maternal Uncle     Past Medical History:  Diagnosis Date  . Allergic rhinitis   . Anemia    of other chronic disease  . Anxiety   . Asthma   . Back pain    unknown   . Cerebral palsy (HCC)   . Headache(784.0)    frequently  . Hypertension   . Incontinence of urine   . Liver disease    Denies 09/10/2018  . Low sodium diet   . Lumbago 05/21/2014   pain in lower back   . Muscle spasm    takes Flexeril daily  . Neuromuscular disorder (HCC)    cerebral palsy  . Numbness    bilateral feet  . Peripheral edema    occasionally  . PONV (postoperative nausea and vomiting)   . Rhegmatogenous retinal detachment of both eyes   . Seizure disorder (HCC)   . Seizures (HCC)    last one in high school;takes Depakote daily  . Wears glasses     Patient Active  Problem List   Diagnosis Date Noted  . Intractable migraine with aura with status migrainosus 01/08/2017  . Post-operative state 03/03/2015  . 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  . Asthma, mild intermittent, well-controlled 12/13/2014  . Low lying placenta without hemorrhage, antepartum   . Previous cesarean section complicating pregnancy, antepartum condition or complication 09/09/2014  . Epilepsy affecting pregnancy, 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  . Asthma complicating pregnancy, antepartum 09/09/2014  . Cerebral palsy (HCC)   . Seizure disorder [facility](HCC)     Past Surgical History:  Procedure Laterality Date  . CESAREAN SECTION  2007  . CESAREAN SECTION  WITH BILATERAL TUBAL LIGATION Bilateral 03/03/2015   Procedure: CESAREAN SECTION WITH BILATERAL TUBAL LIGATION;  Surgeon: Willodean Rosenthal, MD;  Location: [facility] ORS;  Service: Obstetrics;  Laterality: Bilateral;  Requested 03/03/15 @ 3:30p  Ok per Cleo/Myra-TM  . GAS/FLUID EXCHANGE Right 09/11/2018   Procedure: Gas/Fluid Exchange RIGHT EYE;  Surgeon: Rennis Chris, MD;  Location: [facility] OR;  Service: Ophthalmology;  Laterality: Right;  [id number]  . LASER PHOTO ABLATION Left 09/11/2018   Procedure: LASER PHOTO ABLATION LEFT EYE;  Surgeon: Rennis Chris, MD;  Location: [facility] OR;  Service: Ophthalmology;  Laterality: Left;  . LEG SURGERY Bilateral   . PARS PLANA VITRECTOMY Right 09/11/2018   Procedure: PARS PLANA VITRECTOMY WITH 25 GAUGE RIGHT EYE;  Surgeon: Rennis Chris, MD;  Location: [facility] OR;  Service: Ophthalmology;  Laterality: Right;  . PERFLUORONE INJECTION Right 09/11/2018   Procedure: Perfluorone Injection RIGHT EYE;  Surgeon: Rennis Chris, MD;  Location: [facility] OR;  Service: Ophthalmology;  Laterality: Right;  . PHOTOCOAGULATION WITH LASER Right 09/11/2018   Procedure: Photocoagulation With Laser RIGHT EYE;  Surgeon: Rennis Chris, MD;  Location: [facility] OR;  Service: Ophthalmology;  Laterality: Right;  . SCLERAL BUCKLE Right 09/11/2018   Procedure: Scleral Buckle RIGHT  EYE;  Surgeon: Rennis Chris, MD;  Location: [facility] OR;  Service: Ophthalmology;  Laterality: Right;  . TOOTH EXTRACTION N/A 02/16/2013   Procedure: EXTRACTION 16, 17, 32;  Surgeon: Georgia Lopes, DDS;  Location: [facility] OR;  Service: Oral Surgery;  Laterality: N/A;    Current Outpatient Medications  Medication Sig Dispense Refill  . albuterol (PROVENTIL HFA;VENTOLIN HFA) 108 (90 BASE) MCG/ACT inhaler Inhale 2 puffs into the lungs every 6 (six) hours as needed for wheezing (wheezing). (Patient taking differently: Inhale 2 puffs into the lungs every 6 (six) hours as needed for wheezing. ) 1 Inhaler 5  . enalapril (VASOTEC) 20 MG tablet Take 1 tablet (20 mg total) by mouth daily. 30 tablet 1  . ferrous sulfate 325 (65 FE) MG tablet Take 1 tablet (325 mg total) by mouth 2 (two) times daily with a meal. 60 tablet 3  . fluticasone (FLONASE) 50 MCG/ACT nasal spray Place 2 sprays into both nostrils daily as needed for allergies.     Marland Kitchen ibuprofen (ADVIL,MOTRIN) 800 MG tablet Take 1 tablet (800 mg total) by mouth every 8 (eight) hours as needed for moderate pain. (Patient taking differently: Take 800 mg by mouth 3 (three) times daily as needed. ) 21 tablet 0  . montelukast (SINGULAIR) 10 MG tablet Take 10 mg by mouth at bedtime.    . Prenatal 28-0.8 MG TABS Take 1 tablet by mouth daily.    Marland Kitchen tiZANidine (ZANAFLEX) 4 MG tablet Take 4 mg by mouth 3 (three) times daily as needed for muscle spasms.     . Vitamin D, Ergocalciferol, (DRISDOL) 1.25 MG (50000 UT) CAPS capsule Take 50,000 Units by mouth every 7 (seven) days.     Marland Kitchen levETIRAcetam (KEPPRA) 1000 MG tablet Take 1 tablet (1,000 mg total) by mouth 2 (two) times daily. 180 tablet 3  . loratadine (CLARITIN) 10 MG tablet Take 10 mg by mouth daily as needed for allergies.     Current Facility-Administered Medications  Medication Dose Route Frequency Provider Last Rate Last Admin  . diphenhydrAMINE (BENADRYL) injection 25 mg  25 mg Intramuscular Once Van Clines,  MD      . metoCLOPramide (REGLAN) 10 mg in dextrose 5 % 50 mL IVPB  10 mg Intramuscular Once Van Clines, MD  Allergies as of 03/30/2020 - Review Complete 03/30/2020  Allergen Reaction Noted  . Shrimp [shellfish allergy] Anaphylaxis 09/09/2014  . Codeine Other (See Comments) 11/03/2012  . Dust mite extract Other (See Comments) 11/03/2012  . Mold extract [trichophyton] Other (See Comments) 11/03/2012  . Penicillins Hives and Other (See Comments) 11/03/2012  . Soap Other (See Comments) 11/03/2012    Vitals: BP (!) 151/97 (BP Location: Right Arm, Patient Position: Sitting)   Pulse 92   Ht 4\' 11"  (1.499 m)   Wt 161 lb (73 kg)   BMI 32.52 kg/m  Last Weight:  Wt Readings from Last 1 Encounters:  03/30/20 161 lb (73 kg)   Last Height:   Ht Readings from Last 1 Encounters:  03/30/20 4\' 11"  (1.499 m)     Physical exam: Exam: Gen: NAD, conversant, well nourised, obese, well groomed                     CV: RRR, no MRG. No Carotid Bruits. No peripheral edema, warm, nontender Eyes: Conjunctivae clear without exudates or hemorrhage  Neuro: Detailed Neurologic Exam  Speech:    Speech is dysarthric Cognition:    The patient is oriented to person, place, and time;     recent and remote memory intact;     language without aphasia    normal attention, concentration    fund of knowledge impaired Cranial Nerves:    The pupils are equal, round, and reactive to light. The fundi are normal and spontaneous venous pulsations are present. Visual fields are full to finger confrontation. Extraocular movements are intact. Trigeminal sensation is intact and the muscles of mastication are normal. The face is symmetric. The palate elevates in the midline. Hearing intact. Voice is normal. Shoulder shrug is normal. The tongue has normal motion without fasciculations.   Coordination:    Normal finger to nose  Gait:    Steppage gait right foot drop  Motor Observation:    No asymmetry,  no atrophy, and no involuntary movements noted. Tone:    Normal muscle tone.    Posture:    Posture is normal. normal erect    Strength: Right dorsiflexion 3 out of 5 otherwise strength is V/V in the upper and lower limbs.      Sensation: intact to LT     Reflex Exam:  DTR's:    Deep tendon reflexes in the upper and lower extremities are normal bilaterally.   Toes:    The toes are downgoing bilaterally.   Clonus:    Clonus is absent.    Assessment/Plan: 34 year old female with a history of cerebral palsy, migraines, seizures since childhood, reported she did have a recent seizure with generalized tonic-clonic activity, after review of records it is really unclear she is a poor historian, she reports seizures, her stepmother (who was not present today) has reported possible nonepileptic events, she comes in today with a bottle of Keppra from June of this year with several pills still in it reporting recent seizure and Keppra not being strong enough, stated she was compliant on the medication when she had the event.  In the past Dr. 20 and epileptologist suggested EEG, she never followed up for EEG or with epileptologist.  Very non-compliant patient here again for seizures. She saw me in 2016 and then saw Dr. Karel Jarvis at [facility] but never followed up with either of Karel Jarvis, she no showed or canceled multiple appointments at [facility] and now she is here today. I discussed  compliance with patient, she will have to come to scheduled appointments and follow medical management. Failure to do so will result in dismissal from clinic. I do think that patient has the capacity to understand our conversation, follow directions and understand the importance of what I am saying. Follow up 3 months and check a level. If non-compliant please discuss with physician.   Bloodwork May consider routine eeg or longer-term eeg monitoring however with her history of seizures, focal brain findings, CP will keep  her on AEDs at this time.   Orders Placed This Encounter  Procedures  . Comprehensive metabolic panel  . CBC with Differential/Platelets   Meds ordered this encounter  Medications  . levETIRAcetam (KEPPRA) 1000 MG tablet    Sig: Take 1 tablet (1,000 mg total) by mouth 2 (two) times daily.    Dispense:  180 tablet    Refill:  3   Discussed: Per [facility] statutes, patients with seizures are not allowed to drive until they have been seizure-free for six months.    Use caution when using heavy equipment or power tools. Avoid working on ladders or at heights. Take showers instead of baths. Ensure the water temperature is not too high on the home water heater. Do not go swimming alone. Do not lock yourself in a room alone (i.e. bathroom). When caring for infants or small children, sit down when holding, feeding, or changing them to minimize risk of injury to the child in the event you have a seizure. Maintain good sleep hygiene. Avoid alcohol.    If patient has another seizure, call 911 and bring them back to the ED if: A.  The seizure lasts longer than 5 minutes.      B.  The patient doesn't wake shortly after the seizure or has new problems such as difficulty seeing, speaking or moving following the seizure C.  The patient was injured during the seizure D.  The patient has a temperature over 102 F (39C) E.  The patient vomited during the seizure and now is having trouble breathing  Per [facility] statutes, patients with seizures are not allowed to drive until they have been seizure-free for six months.  Other recommendations include using caution when using heavy equipment or power tools. Avoid working on ladders or at heights. Take showers instead of baths.  Do not swim alone.  Ensure the water temperature is not too high on the home water heater. Do not go swimming alone. Do not lock yourself in a room alone (i.e. bathroom). When caring for infants or small children, sit  down when holding, feeding, or changing them to minimize risk of injury to the child in the event you have a seizure. Maintain good sleep hygiene. Avoid alcohol.  Also recommend adequate sleep, hydration, good diet and minimize stress.  During the Seizure  - First, ensure adequate ventilation and place patients on the floor on their left side  Loosen clothing around the neck and ensure the airway is patent. If the patient is clenching the teeth, do not force the mouth open with any object as this can cause severe damage - Remove all items from the surrounding that can be hazardous. The patient may be oblivious to what's happening and may not even know what he or she is doing. If the patient is confused and wandering, either gently guide him/her away and block access to outside areas - Reassure the individual and be comforting - Call 911. In most cases,  the seizure ends before EMS arrives. However, there are cases when seizures may last over 3 to 5 minutes. Or the individual may have developed breathing difficulties or severe injuries. If a pregnant patient or a person with diabetes develops a seizure, it is prudent to call an ambulance. - Finally, if the patient does not regain full consciousness, then call EMS. Most patients will remain confused for about 45 to 90 minutes after a seizure, so you must use judgment in calling for help. - Avoid restraints but make sure the patient is in a bed with padded side rails - Place the individual in a lateral position with the neck slightly flexed; this will help the saliva drain from the mouth and prevent the tongue from falling backward - Remove all nearby furniture and other hazards from the area - Provide verbal assurance as the individual is regaining consciousness - Provide the patient with privacy if possible - Call for help and start treatment as ordered by the caregiver   fter the Seizure (Postictal Stage)  After a seizure, most patients experience  confusion, fatigue, muscle pain and/or a headache. Thus, one should permit the individual to sleep. For the next few days, reassurance is essential. Being calm and helping reorient the person is also of importance.  Most seizures are painless and end spontaneously. Seizures are not harmful to others but can lead to complications such as stress on the lungs, brain and the heart. Individuals with prior lung problems may develop labored breathing and respiratory distress.   Cc: Fleet Contras, MD,  Naomie Dean, MD  [facility] [address] [place], [place] [address]  Phone [phone] Fax [phone]  I spent 90 minutes of face-to-face and non-face-to-face time with patient on the  1. Seizure disorder (HCC)   2. Encounter for long-term (current) use of medications    diagnosis.  This included previsit chart review, lab review, study review, order entry, electronic health record documentation, patient education on the different diagnostic and therapeutic options, counseling and coordination of care, risks and benefits of management, compliance, or risk factor reduction

## 2020-07-12 ENCOUNTER — Ambulatory Visit: Payer: Medicare Other | Admitting: Family Medicine

## 2020-08-09 ENCOUNTER — Ambulatory Visit: Payer: Medicare Other | Admitting: Neurology

## 2020-09-29 ENCOUNTER — Encounter (INDEPENDENT_AMBULATORY_CARE_PROVIDER_SITE_OTHER): Payer: Self-pay | Admitting: Ophthalmology

## 2020-10-12 NOTE — Progress Notes (Deleted)
[facility]    Provider:  Dr Lucia Gaskins Requesting Provider: Fleet Contras, MD Primary Care Provider:  Fleet Contras, MD  CC: Seizures  HPI 03/30/2020:  Dana Parks is a 35 y.o. female here as requested by Fleet Contras, MD for seizures.  She has a past medical history of cerebral palsy, cognitive impairment, and a history of seizures, she has had seizures since a child, infancy, the history appears to be generalized tonic-clonic seizures, staring, loss of consciousness followed by convulsive activity, foaming at the mouth, denied any olfactory hallucinations dj vu rising epigastric sensation, focal numbness tingling weakness, myoclonic jerks she also has a history of migraines, she reports her seizures have always started with vision changes and "passing out", she loses her vision she passes out, she does remember what happens during her loss of consciousness, patient has been noncompliant in the past, poor historian, difficult to understand a complete seizure history and evolution of past and current seizure activity.  She has been in the emergency room and admitted multiple times for breakthrough seizures likely due to noncompliance.  Patient lives with her stepmother.  Very non-compliant patient here again for seizures. She saw me in 2016 and then saw Dr. Karel Jarvis at [facility] but never followed up with either of [place], she no showed or canceled multiple appointments at [facility] and now she is here today. She had an episode in 2019 that scared her so much she had to go to the hospital. I had a conversation about this with her.She states that she was taking her Keppra  twice daily but still had a seizure. She shows me an old bottle with pills left in it (filled 01/11/20 for 30 days) and she has not had any medication since then. She doesn't drive.   Reviewed notes, labs and imaging from outside physicians, which showed:  I reviewed Dr. 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  Her uncle has seizures.  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   Review of Systems: Patient complains of symptoms per HPI as well as the following symptoms: Seizures, headaches. Pertinent negatives and positives per HPI. All others negative.   Social History   Socioeconomic History  . Marital status: Single    Spouse name: Not on file  . Number of children: 3  . Years of education: high schoo  . Highest education level: Not on file  Occupational History  . Occupation: Disabled  Tobacco Use  . Smoking status: Current Every Day Smoker    Packs/day: 0.25    Years: 7.00    Pack years: 1.75    Types: Cigarettes    Last attempt to quit: 11/05/2014    Years since quitting: 5.9  . Smokeless tobacco: Never Used  . Tobacco comment: 1 cigar/day  Vaping Use  . Vaping Use: Never used  Substance and Sexual Activity  . Alcohol use: No    Alcohol/week: 0.0 standard drinks  . Drug use: Not Currently  Types: Marijuana    Comment: feb 2020 last use  . Sexual activity: Yes    Partners: Male    Birth control/protection: Surgical  Other Topics Concern  . Not on file  Social History Narrative   Lives back and forth with sister and step mother.   Single.    Has 3 children   Social Determinants of Corporate investment bankerHealth   Financial Resource Strain: Not on file  Food Insecurity: Not on file  Transportation  Needs: Not on file  Physical Activity: Not on file  Stress: Not on file  Social Connections: Not on file  Intimate Partner Violence: Not on file    Family History  Problem Relation Age of Onset  . Diabetes Maternal Grandfather   . Hypertension Maternal Grandfather   . High blood pressure Mother   . High blood pressure Other        mother's side   . Seizures Maternal Uncle     Past Medical History:  Diagnosis Date  . Allergic rhinitis   . Anemia    of other chronic disease  . Anxiety   . Asthma   . Back pain    unknown   . Cerebral palsy (HCC)   . Headache(784.0)    frequently  . Hypertension   . Incontinence of urine   . Liver disease    Denies 09/10/2018  . Low sodium diet   . Lumbago 05/21/2014   pain in lower back   . Muscle spasm    takes Flexeril daily  . Neuromuscular disorder (HCC)    cerebral palsy  . Numbness    bilateral feet  . Peripheral edema    occasionally  . PONV (postoperative nausea and vomiting)   . Rhegmatogenous retinal detachment of both eyes   . Seizure disorder (HCC)   . Seizures (HCC)    last one in high school;takes Depakote daily  . Wears glasses     Patient Active Problem List   Diagnosis Date Noted  . Intractable migraine with aura with status migrainosus 01/08/2017  . Post-operative state 03/03/2015  . 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  . Asthma, mild intermittent, well-controlled 12/13/2014  . Low lying placenta without hemorrhage, antepartum   . Previous cesarean section complicating pregnancy, antepartum condition or complication 09/09/2014  . Epilepsy affecting pregnancy, 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  . Asthma complicating pregnancy, antepartum 09/09/2014  . Cerebral palsy (HCC)   . Seizure disorder [facility](HCC)     Past Surgical History:  Procedure Laterality Date  . CESAREAN SECTION  2007  . CESAREAN SECTION WITH BILATERAL TUBAL LIGATION Bilateral 03/03/2015   Procedure: CESAREAN SECTION WITH BILATERAL TUBAL LIGATION;  Surgeon:  Willodean Rosenthalarolyn Harraway-Smith, MD;  Location: [facility];  Service: Obstetrics;  Laterality: Bilateral;  Requested 03/03/15 @ 3:30p  Ok per Cleo/Myra-TM  . GAS/FLUID EXCHANGE Right 09/11/2018   Procedure: Gas/Fluid Exchange RIGHT EYE;  Surgeon: Rennis ChrisZamora, Brian, MD;  [facility];  Service: Ophthalmology;  Laterality: Right;  [id number]  . LASER PHOTO ABLATION Left 09/11/2018   Procedure: LASER PHOTO ABLATION LEFT EYE;  Surgeon: Rennis ChrisZamora, Brian, MD;  [facility];  Service: Ophthalmology;  Laterality: Left;  . LEG SURGERY Bilateral   . PARS PLANA VITRECTOMY Right 09/11/2018   Procedure: PARS PLANA VITRECTOMY WITH 25 GAUGE RIGHT EYE;  Surgeon: Rennis ChrisZamora, Brian, MD;  [facility];  Service: Ophthalmology;  Laterality: Right;  . PERFLUORONE INJECTION Right 09/11/2018   Procedure: Perfluorone Injection RIGHT EYE;  Surgeon: Rennis ChrisZamora, Brian, MD;  [facility];  Service:  Ophthalmology;  Laterality: Right;  . PHOTOCOAGULATION WITH LASER Right 09/11/2018   Procedure: Photocoagulation With Laser RIGHT EYE;  Surgeon: Rennis Chris, MD;  Location: [facility];  Service: Ophthalmology;  Laterality: Right;  . SCLERAL BUCKLE Right 09/11/2018   Procedure: Scleral Buckle RIGHT EYE;  Surgeon: Rennis Chris, MD;  Location: [facility] OR;  Service: Ophthalmology;  Laterality: Right;  . TOOTH EXTRACTION N/A 02/16/2013   Procedure: EXTRACTION 16, 17, 32;  Surgeon: Georgia Lopes, DDS;  Location: [facility] OR;  Service: Oral Surgery;  Laterality: N/A;    Current Outpatient Medications  Medication Sig Dispense Refill  . albuterol (PROVENTIL HFA;VENTOLIN HFA) 108 (90 BASE) MCG/ACT inhaler Inhale 2 puffs into the lungs every 6 (six) hours as needed for wheezing (wheezing). (Patient taking differently: Inhale 2 puffs into the lungs every 6 (six) hours as needed for wheezing. ) 1 Inhaler 5  . enalapril (VASOTEC) 20 MG tablet Take 1 tablet (20 mg total) by mouth daily. 30 tablet 1  . ferrous sulfate 325 (65 FE) MG tablet Take 1 tablet (325 mg total) by mouth 2 (two)  times daily with a meal. 60 tablet 3  . fluticasone (FLONASE) 50 MCG/ACT nasal spray Place 2 sprays into both nostrils daily as needed for allergies.     Marland Kitchen ibuprofen (ADVIL,MOTRIN) 800 MG tablet Take 1 tablet (800 mg total) by mouth every 8 (eight) hours as needed for moderate pain. (Patient taking differently: Take 800 mg by mouth 3 (three) times daily as needed. ) 21 tablet 0  . levETIRAcetam (KEPPRA) 1000 MG tablet Take 1 tablet (1,000 mg total) by mouth 2 (two) times daily. 180 tablet 3  . loratadine (CLARITIN) 10 MG tablet Take 10 mg by mouth daily as needed for allergies.    . montelukast (SINGULAIR) 10 MG tablet Take 10 mg by mouth at bedtime.    . Prenatal 28-0.8 MG TABS Take 1 tablet by mouth daily.    Marland Kitchen tiZANidine (ZANAFLEX) 4 MG tablet Take 4 mg by mouth 3 (three) times daily as needed for muscle spasms.     . Vitamin D, Ergocalciferol, (DRISDOL) 1.25 MG (50000 UT) CAPS capsule Take 50,000 Units by mouth every 7 (seven) days.      Current Facility-Administered Medications  Medication Dose Route Frequency Provider Last Rate Last Admin  . diphenhydrAMINE (BENADRYL) injection 25 mg  25 mg Intramuscular Once Van Clines, MD      . metoCLOPramide (REGLAN) 10 mg in dextrose 5 % 50 mL IVPB  10 mg Intramuscular Once Van Clines, MD        Allergies as of 10/13/2020 - Review Complete 03/30/2020  Allergen Reaction Noted  . Shrimp [shellfish allergy] Anaphylaxis 09/09/2014  . Codeine Other (See Comments) 11/03/2012  . Dust mite extract Other (See Comments) 11/03/2012  . Mold extract [trichophyton] Other (See Comments) 11/03/2012  . Penicillins Hives and Other (See Comments) 11/03/2012  . Soap Other (See Comments) 11/03/2012    Vitals: There were no vitals taken for this visit. Last Weight:  Wt Readings from Last 1 Encounters:  03/30/20 161 lb (73 kg)   Last Height:   Ht Readings from Last 1 Encounters:  03/30/20 4\' 11"  (1.499 m)     Physical exam: Exam: Gen: NAD,  conversant, well nourised, obese, well groomed                     CV: RRR, no MRG. No Carotid Bruits. No peripheral edema, warm, nontender Eyes: Conjunctivae clear  without exudates or hemorrhage  Neuro: Detailed Neurologic Exam  Speech:    Speech is dysarthric Cognition:    The patient is oriented to person, place, and time;     recent and remote memory intact;     language without aphasia    normal attention, concentration    fund of knowledge impaired Cranial Nerves:    The pupils are equal, round, and reactive to light. The fundi are normal and spontaneous venous pulsations are present. Visual fields are full to finger confrontation. Extraocular movements are intact. Trigeminal sensation is intact and the muscles of mastication are normal. The face is symmetric. The palate elevates in the midline. Hearing intact. Voice is normal. Shoulder shrug is normal. The tongue has normal motion without fasciculations.   Coordination:    Normal finger to nose  Gait:    Steppage gait right foot drop  Motor Observation:    No asymmetry, no atrophy, and no involuntary movements noted. Tone:    Normal muscle tone.    Posture:    Posture is normal. normal erect    Strength: Right dorsiflexion 3 out of 5 otherwise strength is V/V in the upper and lower limbs.      Sensation: intact to LT     Reflex Exam:  DTR's:    Deep tendon reflexes in the upper and lower extremities are normal bilaterally.   Toes:    The toes are downgoing bilaterally.   Clonus:    Clonus is absent.    Assessment/Plan: 35 year old female with a history of cerebral palsy, migraines, seizures since childhood, reported she did have a recent seizure with generalized tonic-clonic activity, after review of records it is really unclear she is a poor historian, she reports seizures, her stepmother (who was not present today) has reported possible nonepileptic events, she comes in today with a bottle of Keppra from June of  this year with several pills still in it reporting recent seizure and Keppra not being strong enough, stated she was compliant on the medication when she had the event.  In the past Dr. Karel Jarvis and epileptologist suggested EEG, she never followed up for EEG or with epileptologist.  Very non-compliant patient here again for seizures. She saw me in 2016 and then saw Dr. Karel Jarvis at [facility] but never followed up with either of [place], she no showed or canceled multiple appointments at [facility] and now she is here today. I discussed compliance with patient, she will have to come to scheduled appointments and follow medical management. Failure to do so will result in dismissal from clinic. I do think that patient has the capacity to understand our conversation, follow directions and understand the importance of what I am saying. Follow up 3 months and check a level. If non-compliant please discuss with physician.   Bloodwork May consider routine eeg or longer-term eeg monitoring however with her history of seizures, focal brain findings, CP will keep her on AEDs at this time.   No orders of the defined types were placed in this encounter.  No orders of the defined types were placed in this encounter.  Discussed: Per [facility] statutes, patients with seizures are not allowed to drive until they have been seizure-free for six months.    Use caution when using heavy equipment or power tools. Avoid working on ladders or at heights. Take showers instead of baths. Ensure the water temperature is not too high on the home water heater. Do not go swimming alone.  Do not lock yourself in a room alone (i.e. bathroom). When caring for infants or small children, sit down when holding, feeding, or changing them to minimize risk of injury to the child in the event you have a seizure. Maintain good sleep hygiene. Avoid alcohol.    If patient has another seizure, call 911 and bring them back to the ED if: A.  The  seizure lasts longer than 5 minutes.      B.  The patient doesn't wake shortly after the seizure or has new problems such as difficulty seeing, speaking or moving following the seizure C.  The patient was injured during the seizure D.  The patient has a temperature over 102 F (39C) E.  The patient vomited during the seizure and now is having trouble breathing  Per [facility] statutes, patients with seizures are not allowed to drive until they have been seizure-free for six months.  Other recommendations include using caution when using heavy equipment or power tools. Avoid working on ladders or at heights. Take showers instead of baths.  Do not swim alone.  Ensure the water temperature is not too high on the home water heater. Do not go swimming alone. Do not lock yourself in a room alone (i.e. bathroom). When caring for infants or small children, sit down when holding, feeding, or changing them to minimize risk of injury to the child in the event you have a seizure. Maintain good sleep hygiene. Avoid alcohol.  Also recommend adequate sleep, hydration, good diet and minimize stress.  During the Seizure  - First, ensure adequate ventilation and place patients on the floor on their left side  Loosen clothing around the neck and ensure the airway is patent. If the patient is clenching the teeth, do not force the mouth open with any object as this can cause severe damage - Remove all items from the surrounding that can be hazardous. The patient may be oblivious to what's happening and may not even know what he or she is doing. If the patient is confused and wandering, either gently guide him/her away and block access to outside areas - Reassure the individual and be comforting - Call 911. In most cases, the seizure ends before EMS arrives. However, there are cases when seizures may last over 3 to 5 minutes. Or the individual may have developed breathing difficulties or severe injuries. If a  pregnant patient or a person with diabetes develops a seizure, it is prudent to call an ambulance. - Finally, if the patient does not regain full consciousness, then call EMS. Most patients will remain confused for about 45 to 90 minutes after a seizure, so you must use judgment in calling for help. - Avoid restraints but make sure the patient is in a bed with padded side rails - Place the individual in a lateral position with the neck slightly flexed; this will help the saliva drain from the mouth and prevent the tongue from falling backward - Remove all nearby furniture and other hazards from the area - Provide verbal assurance as the individual is regaining consciousness - Provide the patient with privacy if possible - Call for help and start treatment as ordered by the caregiver   fter the Seizure (Postictal Stage)  After a seizure, most patients experience confusion, fatigue, muscle pain and/or a headache. Thus, one should permit the individual to sleep. For the next few days, reassurance is essential. Being calm and helping reorient the person is also of importance.  Most seizures are painless and end spontaneously. Seizures are not harmful to others but can lead to complications such as stress on the lungs, brain and the heart. Individuals with prior lung problems may develop labored breathing and respiratory distress.   Cc: Fleet Contras, MD,  Naomie Dean, MD  [facility] [address] [place], [place] [address]  Phone [phone] Fax [phone]  I spent 90 minutes of face-to-face and non-face-to-face time with patient on the  No diagnosis found. diagnosis.  This included previsit chart review, lab review, study review, order entry, electronic health record documentation, patient education on the different diagnostic and therapeutic options, counseling and coordination of care, risks and benefits of management, compliance, or risk factor  reduction

## 2020-10-13 ENCOUNTER — Ambulatory Visit: Payer: Medicare Other | Admitting: Neurology

## 2020-10-13 ENCOUNTER — Telehealth: Payer: Self-pay | Admitting: Neurology

## 2020-10-13 NOTE — Telephone Encounter (Signed)
Pt called, cancel appt for today, will not be able to come today.  Pt has rescheduled appt.

## 2020-10-13 NOTE — Telephone Encounter (Signed)
Patient has canceled at least 3 times within 24 hours. She is also non compliant, she was supposed to be seen 3 months ago but she canceled that appointment as well within 24 hours. I spoke to her last time about compliance and our policy for dismissal. Please dismiss patient from our practice.

## 2020-10-13 NOTE — Telephone Encounter (Signed)
Noted, looks like she's had one other cancellation in under 24 hours for follow-up appointment in the last year.

## 2020-10-13 NOTE — Telephone Encounter (Signed)
Apologies for missing the other cancellations.

## 2020-10-17 ENCOUNTER — Encounter: Payer: Self-pay | Admitting: Neurology

## 2020-10-25 NOTE — Progress Notes (Signed)
[facility] - Clinic Note  10/28/2020     CHIEF COMPLAINT Patient presents for Retina Follow Up   HISTORY OF PRESENT ILLNESS: Dana Parks is a 35 y.o. female who presents to the clinic today for:   HPI    Retina Follow Up    Patient presents with  Retinal Break/Detachment.  In both eyes.  This started 2 years ago.  I, the attending physician,  performed the HPI with the patient and updated documentation appropriately.          Comments    Patient here for 2 years retina follow up for RD OU. Patient states vision blurry. Has sharp pain. Used drops. OK then . Then pain came back. When OD hurts can't see.  The drops feel like freezes back of eye.Back and forth. Uses drops then ok then eye hurts. Then uses drops. Over and over.       Last edited by Rennis Chris, MD on 10/28/2020  2:28 PM. (History)    pt lost to follow up since April 2020, she states she has been using Soothe eye drops in the morning and at night, she states they were helping, but her eye is now blurry and painful  Referring physician: Fleet Contras, MD [address] [place],  [place] [address]  HISTORICAL INFORMATION:   Selected notes from the MEDICAL RECORD NUMBER Referred by Dr. Aura Camps for concern of BRAO OD LEE: 01.14.20 Kirk Ruths) [BCVA: OD: 20/30- OS: 20/25 Ocular Hx- PMH-anxiety, arthritis, cerebral palsy, headaches, seizures    CURRENT MEDICATIONS: No current outpatient medications on file. (Ophthalmic Drugs)   No current facility-administered medications for this visit. (Ophthalmic Drugs)   Current Outpatient Medications (Other)  Medication Sig  . albuterol (PROVENTIL HFA;VENTOLIN HFA) 108 (90 BASE) MCG/ACT inhaler Inhale 2 puffs into the lungs every 6 (six) hours as needed for wheezing (wheezing). (Patient taking differently: Inhale 2 puffs into the lungs every 6 (six) hours as needed for wheezing. )  . enalapril (VASOTEC) 20 MG tablet Take 1 tablet (20 mg  total) by mouth daily.  . ferrous sulfate 325 (65 FE) MG tablet Take 1 tablet (325 mg total) by mouth 2 (two) times daily with a meal.  . fluticasone (FLONASE) 50 MCG/ACT nasal spray Place 2 sprays into both nostrils daily as needed for allergies.   Marland Kitchen ibuprofen (ADVIL,MOTRIN) 800 MG tablet Take 1 tablet (800 mg total) by mouth every 8 (eight) hours as needed for moderate pain. (Patient taking differently: Take 800 mg by mouth 3 (three) times daily as needed. )  . levETIRAcetam (KEPPRA) 1000 MG tablet Take 1 tablet (1,000 mg total) by mouth 2 (two) times daily.  Marland Kitchen loratadine (CLARITIN) 10 MG tablet Take 10 mg by mouth daily as needed for allergies.  . montelukast (SINGULAIR) 10 MG tablet Take 10 mg by mouth at bedtime.  . Prenatal 28-0.8 MG TABS Take 1 tablet by mouth daily.  Marland Kitchen tiZANidine (ZANAFLEX) 4 MG tablet Take 4 mg by mouth 3 (three) times daily as needed for muscle spasms.   . Vitamin D, Ergocalciferol, (DRISDOL) 1.25 MG (50000 UT) CAPS capsule Take 50,000 Units by mouth every 7 (seven) days.    Current Facility-Administered Medications (Other)  Medication Route  . diphenhydrAMINE (BENADRYL) injection 25 mg Intramuscular  . metoCLOPramide (REGLAN) 10 mg in dextrose 5 % 50 mL IVPB Intramuscular      REVIEW OF SYSTEMS: ROS    Positive for: Neurological, Musculoskeletal, Eyes   Negative  for: Constitutional, Gastrointestinal, Skin, Genitourinary, HENT, Endocrine, Cardiovascular, Respiratory, Psychiatric, Allergic/Imm, Heme/Lymph   Last edited by Laddie Aquas, COA on 10/28/2020  1:51 PM. (History)       ALLERGIES Allergies  Allergen Reactions  . Shrimp [Shellfish Allergy] Anaphylaxis    Can eat other shellfish  . Codeine Other (See Comments)    GI upset  . Dust Mite Extract Other (See Comments)    Unknown reaction  . Mold Extract [Trichophyton] Other (See Comments)    Unknown reaction  . Penicillins Hives and Other (See Comments)    Has patient had a PCN reaction causing  immediate rash, facial/tongue/throat swelling, SOB or lightheadedness with hypotension: No Has patient had a PCN reaction causing severe rash involving mucus membranes or skin necrosis: No Has patient had a PCN reaction that required hospitalization: Yes Has patient had a PCN reaction occurring within the last 10 years: No If all of the above answers are "NO", then may proceed with Cephalosporin use.  Unknown reaction; pt has gotten cefazolin  . Soap Other (See Comments)    Unknown reaction    PAST MEDICAL HISTORY Past Medical History:  Diagnosis Date  . Allergic rhinitis   . Anemia    of other chronic disease  . Anxiety   . Asthma   . Back pain    unknown   . Cerebral palsy (HCC)   . Headache(784.0)    frequently  . Hypertension   . Incontinence of urine   . Liver disease    Denies 09/10/2018  . Low sodium diet   . Lumbago 05/21/2014   pain in lower back   . Muscle spasm    takes Flexeril daily  . Neuromuscular disorder (HCC)    cerebral palsy  . Numbness    bilateral feet  . Peripheral edema    occasionally  . PONV (postoperative nausea and vomiting)   . Rhegmatogenous retinal detachment of both eyes   . Seizure disorder (HCC)   . Seizures (HCC)    last one in high school;takes Depakote daily  . Wears glasses    Past Surgical History:  Procedure Laterality Date  . CESAREAN SECTION  2007  . CESAREAN SECTION WITH BILATERAL TUBAL LIGATION Bilateral 03/03/2015   Procedure: CESAREAN SECTION WITH BILATERAL TUBAL LIGATION;  Surgeon: Willodean Rosenthal, MD;  Location: [facility] ORS;  Service: Obstetrics;  Laterality: Bilateral;  Requested 03/03/15 @ 3:30p  Ok per Cleo/Myra-TM  . GAS/FLUID EXCHANGE Right 09/11/2018   Procedure: Gas/Fluid Exchange RIGHT EYE;  Surgeon: Rennis Chris, MD;  Location: [facility] OR;  Service: Ophthalmology;  Laterality: Right;  C3F8  . LASER PHOTO ABLATION Left 09/11/2018   Procedure: LASER PHOTO ABLATION LEFT EYE;  Surgeon: Rennis Chris, MD;  Location[facility]  OR;  Service: Ophthalmology;  Laterality: Left;  . LEG SURGERY Bilateral   . PARS PLANA VITRECTOMY Right 09/11/2018   Procedure: PARS PLANA VITRECTOMY WITH 25 GAUGE RIGHT EYE;  Surgeon: Rennis Chris, MD;  Location: [facility] OR;  Service: Ophthalmology;  Laterality: Right;  . PERFLUORONE INJECTION Right 09/11/2018   Procedure: Perfluorone Injection RIGHT EYE;  Surgeon: Rennis Chris, MD;  Location: [facility] OR;  Service: Ophthalmology;  Laterality: Right;  . PHOTOCOAGULATION WITH LASER Right 09/11/2018   Procedure: Photocoagulation With Laser RIGHT EYE;  Surgeon: Rennis Chris, MD;  Location: [facility] OR;  Service: Ophthalmology;  Laterality: Right;  . SCLERAL BUCKLE Right 09/11/2018   Procedure: Scleral Buckle RIGHT EYE;  Surgeon: Rennis Chris, MD;  Location: [facility] OR;  Service: Ophthalmology;  Laterality: Right;  . TOOTH EXTRACTION N/A 02/16/2013   Procedure: EXTRACTION 16, 17, 32;  Surgeon: Georgia Lopes, DDS;  Location: [facility] OR;  Service: Oral Surgery;  Laterality: N/A;    FAMILY HISTORY Family History  Problem Relation Age of Onset  . Diabetes Maternal Grandfather   . Hypertension Maternal Grandfather   . High blood pressure Mother   . High blood pressure Other        mother's side   . Seizures Maternal Uncle     SOCIAL HISTORY Social History   Tobacco Use  . Smoking status: Current Every Day Smoker    Packs/day: 0.25    Years: 7.00    Pack years: 1.75    Types: Cigarettes    Last attempt to quit: 11/05/2014    Years since quitting: 5.9  . Smokeless tobacco: Never Used  . Tobacco comment: 1 cigar/day  Vaping Use  . Vaping Use: Never used  Substance Use Topics  . Alcohol use: No    Alcohol/week: 0.0 standard drinks  . Drug use: Not Currently    Types: Marijuana    Comment: feb 2020 last use         OPHTHALMIC EXAM:  Base Eye Exam    Visual Acuity (Snellen - Linear)      Right Left   Dist cc 20/300 20/20   Dist ph cc 20/150 -1    Correction: Glasses       Tonometry (Tonopen, 1:48 PM)       Right Left   Pressure 18 20       Pupils      Dark Light Shape React APD   Right 5 5 Round Minimal None   Left 5 5 Round Minimal None       Visual Fields (Counting fingers)      Left Right    Full    Restrictions  Partial outer superior temporal, inferior temporal, superior nasal, inferior nasal deficiencies       Extraocular Movement      Right Left    Full Full       Neuro/Psych    Oriented x3: Yes   Mood/Affect: Normal       Dilation    Both eyes: 1.0% Mydriacyl, 2.5% Phenylephrine @ 1:47 PM        Slit Lamp and Fundus Exam    Slit Lamp Exam      Right Left   Lids/Lashes Normal Normal   Conjunctiva/Sclera mild melanosis mild Melanosis   Cornea 3+ Punctate epithelial erosions Clear, trace tear film debris   Anterior Chamber Deep and quiet deep; clear   Iris Round, dilated Round, dilated   Lens 1-2+ Nuclear sclerosis, 1-2+ Cortical cataract, 3-4+ Posterior subcapsular cataract 1+NSC; 1+CC   Vitreous post vitrectomy +PVD       Fundus Exam      Right Left   Disc mild Pallor, Sharp rim Compact, trace Pallor, Pink and Sharp   C/D Ratio 0.5 0.5   Macula Flat, Good foveal reflex, No heme or edema Flat, Good foveal reflex, No heme or edema   Vessels mild attenuation, mild tortuousity attenuated, Tortuous, mild Copper wiring   Periphery Retina attached over buckle, good buckle height, good 360 laser Focal RD from 1-2 oclock with retinal hole at 0130; focal detachment from 3-530 w/  3 IT tears; focal detachment from 0700-0730 w/ retinal hole at 0730. Patches of lattice degeneration, good 360 laser--posterior to all pathologies -- good laser around all  breaks, No new RT/RD        Refraction    Wearing Rx      Sphere Cylinder Axis   Right -4.00 +1.50 127   Left -4.25 +0.75 061   Type: SVL          IMAGING AND PROCEDURES  Imaging and Procedures for @TODAY @  OCT, Retina - OU - Both Eyes       Right Eye Quality was poor. Central Foveal Thickness: 319.  Progression has been stable. Findings include normal foveal contour, no IRF, no SRF, myopic contour (Retina stably re-attached).   Left Eye Quality was good. Central Foveal Thickness: 290. Progression has been stable. Findings include normal foveal contour, no SRF, no IRF.   Notes *Images captured and stored on drive  Diagnosis / Impression:  NFP, no IRF/SRF OU OD: retina stably re-attached  Clinical management:  See below  Abbreviations: NFP - Normal foveal profile. CME - cystoid macular edema. PED - pigment epithelial detachment. IRF - intraretinal fluid. SRF - subretinal fluid. EZ - ellipsoid zone. ERM - epiretinal membrane. ORA - outer retinal atrophy. ORT - outer retinal tubulation. SRHM - subretinal hyper-reflective material                 ASSESSMENT/PLAN:    ICD-10-CM   1. Bilateral retinal detachment  H33.23   2. 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 Posterior vitreous detachment of left eye  H43.812   5. Nuclear sclerosis of both eyes  H25.13   6. Myopia of both eyes with astigmatism  H52.13    H52.203      1,2. Bilateral retinal detachments -- OD > OS  ** lost to f/u since 4.10.2020 (almost 2 years) **  - original RDs with fovea on, OD with mild inf nasal macular involvement  - pt subjectively reports vision issues as far back as "years ago"  - both eyes suggestive of chronic detachments  - pre op:   - OD with extensive nasal detachment with subretinal bands and early PVR   - OS with more peripheral focal rhegmatogenous retinal detachments with some pigmented demarcation lines and limited posterior extension  - OD s/p SBP + PPV/PFC/EL/FAX/14% C3F8 OD, 2.06.20             - doing well             - retina attached and in good position -- good buckle height and laser around breaks             - IOP okay at 18  - OCT shows retina nicely attached  - cont Soothe QID OD  - 3-4+ PSC  post RD  repair  - BCVA 20/150  - OS s/p 360 barricade laser retinopexy OS, 2.06.2020  - good laser changes in place  - no new RT/RD OS  - f/u 1 yr  3. Lattice degeneration w/ atrophic holes, both eyes  - significant patches of lattice w/ atrophic holes related to extensive RDs OU as described above  - discussed findings, prognosis, and treatment options  - lattice addressed during surgery above -- good laser in place  4. PVD OS  5. Mixed cataract OU  - discussed diagnosis and progression and likelihood of cataract progression OD following SBP/PPV OD  - OD with worse PSC  - BCVA 20/150 from 20/100 OD  - [facility]  - clear from a retina standpoint to proceed with cataract surgery when pt and surgeon are ready  6. History of myopia w/ astigmatism OU  - discussed increased incidence of RT/RD with myopia    Ophthalmic Meds Ordered this visit:  No orders of the defined types were placed in this encounter.     Return in about 1 year (around 10/28/2021) for f/u RD OU, DFE, OCT.  There are no Patient Instructions on file for this visit.   Explained the diagnoses, plan, and follow up with the patient and they expressed understanding.  Patient expressed understanding of the importance of proper follow up care.   This document serves as a record of services personally performed by Karie Chimera, MD, PhD. It was created on their behalf by Glee Arvin. Manson Passey, OA an ophthalmic technician. The creation of this record is the provider's dictation and/or activities during the visit.    Electronically signed by: Glee Arvin. Manson Passey, [place] 03.22.2022 4:33 PM  Karie Chimera, M.D., Ph.D. Diseases & Surgery of the Retina and Vitreous [facility]  I have reviewed the above documentation for accuracy and completeness, and I agree with the above. Karie Chimera, M.D., Ph.D. 10/28/20 4:33 PM  Abbreviations: M myopia (nearsighted); A astigmatism; H  hyperopia (farsighted); P presbyopia; Mrx spectacle prescription;  CTL contact lenses; OD right eye; OS left eye; OU both eyes  XT exotropia; ET esotropia; PEK punctate epithelial keratitis; PEE punctate epithelial erosions; DES dry eye syndrome; MGD meibomian gland dysfunction; ATs artificial tears; PFAT's preservative free artificial tears; NSC nuclear sclerotic cataract; PSC posterior subcapsular cataract; ERM epi-retinal membrane; PVD posterior vitreous detachment; RD retinal detachment; DM diabetes mellitus; DR diabetic retinopathy; NPDR non-proliferative diabetic retinopathy; PDR proliferative diabetic retinopathy; CSME clinically significant macular edema; DME diabetic macular edema; dbh dot blot hemorrhages; CWS cotton wool spot; POAG primary open angle glaucoma; C/D cup-to-disc ratio; HVF humphrey visual field; GVF goldmann visual field; OCT optical coherence tomography; IOP intraocular pressure; BRVO Branch retinal vein occlusion; CRVO central retinal vein occlusion; CRAO central retinal artery occlusion; BRAO branch retinal artery occlusion; RT retinal tear; SB scleral buckle; PPV pars plana vitrectomy; VH Vitreous hemorrhage; PRP panretinal laser photocoagulation; IVK intravitreal kenalog; VMT vitreomacular traction; MH Macular hole;  NVD neovascularization of the disc; NVE neovascularization elsewhere; AREDS age related eye disease study; ARMD age related macular degeneration; POAG primary open angle glaucoma; EBMD epithelial/anterior basement membrane dystrophy; ACIOL anterior chamber intraocular lens; IOL intraocular lens; PCIOL posterior chamber intraocular lens; Phaco/IOL phacoemulsification with intraocular lens placement; PRK photorefractive keratectomy; LASIK laser assisted in situ keratomileusis; HTN hypertension; DM diabetes mellitus; COPD chronic obstructive pulmonary disease

## 2020-10-28 ENCOUNTER — Encounter (INDEPENDENT_AMBULATORY_CARE_PROVIDER_SITE_OTHER): Payer: Self-pay | Admitting: Ophthalmology

## 2020-10-28 ENCOUNTER — Ambulatory Visit (INDEPENDENT_AMBULATORY_CARE_PROVIDER_SITE_OTHER): Payer: Medicare Other | Admitting: Ophthalmology

## 2020-10-28 ENCOUNTER — Other Ambulatory Visit: Payer: Self-pay

## 2020-10-28 DIAGNOSIS — H5213 Myopia, bilateral: Secondary | ICD-10-CM

## 2020-10-28 DIAGNOSIS — H43813 Vitreous degeneration, bilateral: Secondary | ICD-10-CM

## 2020-10-28 DIAGNOSIS — H3323 Serous retinal detachment, bilateral: Secondary | ICD-10-CM | POA: Diagnosis not present

## 2020-10-28 DIAGNOSIS — H43812 Vitreous degeneration, left eye: Secondary | ICD-10-CM

## 2020-10-28 DIAGNOSIS — H3581 Retinal edema: Secondary | ICD-10-CM | POA: Diagnosis not present

## 2020-10-28 DIAGNOSIS — H35413 Lattice degeneration of retina, bilateral: Secondary | ICD-10-CM

## 2020-10-28 DIAGNOSIS — H2513 Age-related nuclear cataract, bilateral: Secondary | ICD-10-CM

## 2020-10-28 DIAGNOSIS — H52203 Unspecified astigmatism, bilateral: Secondary | ICD-10-CM

## 2020-12-23 ENCOUNTER — Ambulatory Visit (HOSPITAL_COMMUNITY)
Admission: EM | Admit: 2020-12-23 | Discharge: 2020-12-23 | Disposition: A | Discharge status: Home / Self Care | Payer: Medicare Other | Attending: Family Medicine | Admitting: Family Medicine

## 2020-12-23 ENCOUNTER — Encounter (HOSPITAL_COMMUNITY): Payer: Self-pay

## 2020-12-23 ENCOUNTER — Other Ambulatory Visit: Payer: Self-pay

## 2020-12-23 DIAGNOSIS — J069 Acute upper respiratory infection, unspecified: Secondary | ICD-10-CM

## 2020-12-23 DIAGNOSIS — J4541 Moderate persistent asthma with (acute) exacerbation: Secondary | ICD-10-CM

## 2020-12-23 DIAGNOSIS — Z7951 Long term (current) use of inhaled steroids: Secondary | ICD-10-CM | POA: Diagnosis not present

## 2020-12-23 DIAGNOSIS — J3089 Other allergic rhinitis: Secondary | ICD-10-CM

## 2020-12-23 DIAGNOSIS — R059 Cough, unspecified: Secondary | ICD-10-CM | POA: Insufficient documentation

## 2020-12-23 DIAGNOSIS — Z880 Allergy status to penicillin: Secondary | ICD-10-CM | POA: Diagnosis not present

## 2020-12-23 DIAGNOSIS — Z20822 Contact with and (suspected) exposure to covid-19: Secondary | ICD-10-CM | POA: Insufficient documentation

## 2020-12-23 DIAGNOSIS — F17210 Nicotine dependence, cigarettes, uncomplicated: Secondary | ICD-10-CM | POA: Insufficient documentation

## 2020-12-23 LAB — SARS CORONAVIRUS 2 (TAT 6-24 HRS): SARS Coronavirus 2: NEGATIVE

## 2020-12-23 MED ORDER — BUDESONIDE-FORMOTEROL FUMARATE 160-4.5 MCG/ACT IN AERO: 2.0000 | INHALATION_SPRAY | Freq: Two times a day (BID) | RESPIRATORY_TRACT | 3 refills | Status: DC

## 2020-12-23 MED ORDER — MONTELUKAST SODIUM 10 MG PO TABS: 10.0000 mg | ORAL_TABLET | Freq: Every day | ORAL | 3 refills | Status: DC

## 2020-12-23 MED ORDER — FLUTICASONE PROPIONATE 50 MCG/ACT NA SUSP: 1.0000 | Freq: Two times a day (BID) | NASAL | 3 refills | Status: DC

## 2020-12-23 MED ORDER — LORATADINE 10 MG PO TABS: 10.0000 mg | ORAL_TABLET | Freq: Every day | ORAL | 3 refills | Status: DC
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
## 2020-12-23 NOTE — ED Triage Notes (Signed)
Pt reports cough, sneezing, headache and lightheaded x 1 day.

## 2021-01-18 ENCOUNTER — Ambulatory Visit: Payer: Medicare Other | Admitting: Neurology

## 2021-04-08 ENCOUNTER — Encounter (HOSPITAL_COMMUNITY): Payer: Self-pay

## 2021-04-08 ENCOUNTER — Emergency Department (HOSPITAL_COMMUNITY)
Admission: EM | Admit: 2021-04-08 | Discharge: 2021-04-09 | Disposition: A | Discharge status: Home / Self Care | Payer: Medicare Other | Attending: Emergency Medicine | Admitting: Emergency Medicine

## 2021-04-08 ENCOUNTER — Other Ambulatory Visit: Payer: Self-pay

## 2021-04-08 DIAGNOSIS — Z79899 Other long term (current) drug therapy: Secondary | ICD-10-CM | POA: Diagnosis not present

## 2021-04-08 DIAGNOSIS — F17210 Nicotine dependence, cigarettes, uncomplicated: Secondary | ICD-10-CM | POA: Diagnosis not present

## 2021-04-08 DIAGNOSIS — J4520 Mild intermittent asthma, uncomplicated: Secondary | ICD-10-CM | POA: Insufficient documentation

## 2021-04-08 DIAGNOSIS — W228XXA Striking against or struck by other objects, initial encounter: Secondary | ICD-10-CM | POA: Insufficient documentation

## 2021-04-08 DIAGNOSIS — Z7951 Long term (current) use of inhaled steroids: Secondary | ICD-10-CM | POA: Diagnosis not present

## 2021-04-08 DIAGNOSIS — I10 Essential (primary) hypertension: Secondary | ICD-10-CM | POA: Diagnosis not present

## 2021-04-08 DIAGNOSIS — S0083XA Contusion of other part of head, initial encounter: Secondary | ICD-10-CM | POA: Diagnosis not present

## 2021-04-08 DIAGNOSIS — S0990XA Unspecified injury of head, initial encounter: Secondary | ICD-10-CM | POA: Diagnosis present
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
## 2021-04-09 ENCOUNTER — Emergency Department (HOSPITAL_COMMUNITY): Payer: Medicare Other

## 2021-04-09 DIAGNOSIS — S0083XA Contusion of other part of head, initial encounter: Secondary | ICD-10-CM | POA: Diagnosis not present

## 2021-04-09 NOTE — Discharge Instructions (Signed)
[facility]  [address] [place], [place], [place] [address] [phone]  [facility] [place] [address] [place], [place] [address] [phone] Main [phone] Direct  [facility] [place] [address] [place], [place] [address] [phone] Direct   Walk-In Appointment Hours Monday through Friday 8:30 to 4:30 pm   DV Shelters/Women's Shelters  [facility] [address]  [phone]  [facility] Social services organization [address]
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
## 2021-10-01 ENCOUNTER — Emergency Department (HOSPITAL_COMMUNITY): Payer: Medicare Other

## 2021-10-01 ENCOUNTER — Encounter (HOSPITAL_COMMUNITY): Payer: Self-pay | Admitting: Emergency Medicine

## 2021-10-01 ENCOUNTER — Other Ambulatory Visit: Payer: Self-pay

## 2021-10-01 ENCOUNTER — Emergency Department (HOSPITAL_COMMUNITY)
Admission: EM | Admit: 2021-10-01 | Discharge: 2021-10-01 | Disposition: A | Discharge status: Home / Self Care | Payer: Medicare Other | Attending: Emergency Medicine | Admitting: Emergency Medicine

## 2021-10-01 DIAGNOSIS — R519 Headache, unspecified: Secondary | ICD-10-CM | POA: Diagnosis present

## 2021-10-01 DIAGNOSIS — G44209 Tension-type headache, unspecified, not intractable: Secondary | ICD-10-CM | POA: Insufficient documentation

## 2021-10-01 DIAGNOSIS — Z79899 Other long term (current) drug therapy: Secondary | ICD-10-CM | POA: Insufficient documentation

## 2021-10-01 DIAGNOSIS — R4182 Altered mental status, unspecified: Secondary | ICD-10-CM | POA: Insufficient documentation

## 2021-10-01 LAB — CBC
HCT: 36 % (ref 36.0–46.0)
Hemoglobin: 10.5 g/dL — ABNORMAL LOW (ref 12.0–15.0)
MCH: 24.2 pg — ABNORMAL LOW (ref 26.0–34.0)
MCHC: 29.2 g/dL — ABNORMAL LOW (ref 30.0–36.0)
MCV: 82.9 fL (ref 80.0–100.0)
Platelets: 366 10*3/uL (ref 150–400)
RBC: 4.34 MIL/uL (ref 3.87–5.11)
RDW: 14.4 % (ref 11.5–15.5)
WBC: 4 10*3/uL (ref 4.0–10.5)
nRBC: 0 % (ref 0.0–0.2)

## 2021-10-01 LAB — COMPREHENSIVE METABOLIC PANEL
ALT: 13 U/L (ref 0–44)
AST: 19 U/L (ref 15–41)
Albumin: 3.4 g/dL — ABNORMAL LOW (ref 3.5–5.0)
Alkaline Phosphatase: 75 U/L (ref 38–126)
Anion gap: 8 (ref 5–15)
BUN: 10 mg/dL (ref 6–20)
CO2: 23 mmol/L (ref 22–32)
Calcium: 8.8 mg/dL — ABNORMAL LOW (ref 8.9–10.3)
Chloride: 104 mmol/L (ref 98–111)
Creatinine, Ser: 0.73 mg/dL (ref 0.44–1.00)
GFR, Estimated: >60 mL/min (ref >60)
Glucose, Bld: 99 mg/dL (ref 70–99)
Potassium: 3.8 mmol/L (ref 3.5–5.1)
Sodium: 135 mmol/L (ref 135–145)
Total Bilirubin: 0.3 mg/dL (ref 0.3–1.2)
Total Protein: 8.1 g/dL (ref 6.5–8.1)

## 2021-10-01 LAB — I-STAT BETA HCG BLOOD, ED (MC, WL, AP ONLY): I-stat hCG, quantitative: <5 m[IU]/mL (ref <5)

## 2021-10-01 LAB — CBG MONITORING, ED: Glucose-Capillary: 74 mg/dL (ref 70–99)

## 2021-10-01 MED ORDER — DIPHENHYDRAMINE HCL 50 MG/ML IJ SOLN
25.0000 mg | Freq: Once | INTRAMUSCULAR | Status: AC
  Administered 2021-10-01: 25 mg via INTRAVENOUS
  Filled 2021-10-01: qty 1

## 2021-10-01 MED ORDER — SODIUM CHLORIDE 0.9 % IV BOLUS
500.0000 mL | Freq: Once | INTRAVENOUS | Status: AC
  Administered 2021-10-01: 500 mL via INTRAVENOUS

## 2021-10-01 MED ORDER — PROCHLORPERAZINE EDISYLATE 10 MG/2ML IJ SOLN
10.0000 mg | Freq: Once | INTRAMUSCULAR | Status: AC
  Administered 2021-10-01: 10 mg via INTRAVENOUS
  Filled 2021-10-01: qty 2

## 2021-10-01 MED ORDER — LEVETIRACETAM 1000 MG PO TABS: 1000.0000 mg | ORAL_TABLET | Freq: Two times a day (BID) | ORAL | 0 refills | Status: DC

## 2021-10-01 NOTE — Discharge Instructions (Signed)
Please follow-up with your primary care doctor as well as with your neurologist.  If you develop uncontrolled headache, any loss of vision, speech change, numbness, weakness, chest pain or difficulty breathing, come back to ER for reassessment.

## 2021-10-01 NOTE — ED Provider Notes (Signed)
[facility] EMERGENCY DEPARTMENT Provider Note   CSN: [id number] Arrival date & time: 10/01/21  1841     History  Chief Complaint  Patient presents with   Headache   Altered Mental Status    Dana Parks is a 36 y.o. female.  Presented to the emergency department with concern for confusion, headache.  Patient states that earlier today she started having headache, was not sudden onset, has been progressively worse throughout the day today.  Frontal, nonradiating, throbbing, no alleviating or aggravating factors.  Has tried Motrin without improvement.  Denies any recent seizure activity.  States that she has been compliant with her seizure medications.  Additional history was obtained from chart review, patient seen by neurology, Dr. Jaynee Eagles.  Has history of cerebral palsy, cognitive impairment, seizure disorder.  Contacted emergency contact listed in epic, step mother Wilfrid Lund -states that she was unaware the patient was at the hospital today, states that she has been living with her sister. HPI     Home Medications Prior to Admission medications   Medication Sig Start Date End Date Taking? Authorizing Provider  albuterol (PROVENTIL HFA;VENTOLIN HFA) 108 (90 BASE) MCG/ACT inhaler Inhale 2 puffs into the lungs every 6 (six) hours as needed for wheezing (wheezing). Patient taking differently: Inhale 2 puffs into the lungs every 6 (six) hours as needed for wheezing. 09/09/14   Anyanwu, Sallyanne Havers, MD  budesonide-formoterol (SYMBICORT) 160-4.5 MCG/ACT inhaler Inhale 2 puffs into the lungs 2 (two) times daily. Rinse mouth with water after each use 12/23/20   Volney American, PA-C  enalapril (VASOTEC) 20 MG tablet Take 1 tablet (20 mg total) by mouth daily. 03/14/15   Truett Mainland, DO  ferrous sulfate 325 (65 FE) MG tablet Take 1 tablet (325 mg total) by mouth 2 (two) times daily with a meal. Patient not taking: No sig reported 03/05/15   Marcene Corning, MD   fluticasone [employer]) 50 MCG/ACT nasal spray Place 1 spray into both nostrils in the morning and at bedtime. Patient taking differently: Place 1 spray into both nostrils 2 (two) times daily as needed for allergies. 12/23/20   Volney American, PA-C  ibuprofen (ADVIL,MOTRIN) 800 MG tablet Take 1 tablet (800 mg total) by mouth every 8 (eight) hours as needed for moderate pain. Patient taking differently: Take 800 mg by mouth 2 (two) times daily. 05/13/17   Ward, Ozella Almond, PA-C  levETIRAcetam (KEPPRA) 1000 MG tablet Take 1 tablet (1,000 mg total) by mouth 2 (two) times daily. 10/01/21   Lucrezia Starch, MD  levETIRAcetam (KEPPRA) 500 MG tablet Take 500 mg by mouth 2 (two) times daily. 10/21/20   [provider]  loratadine (CLARITIN) 10 MG tablet Take 1 tablet (10 mg total) by mouth daily. Patient not taking: Reported on 04/09/2021 12/23/20   Volney American, PA-C  montelukast (SINGULAIR) 10 MG tablet Take 1 tablet (10 mg total) by mouth at bedtime. 12/23/20   Volney American, PA-C      Allergies    Shrimp [shellfish allergy], Codeine, Dust mite extract, Mold extract [trichophyton], Penicillins, and Soap    Review of Systems   Review of Systems  Constitutional:  Negative for chills and fever.  HENT:  Negative for ear pain and sore throat.   Eyes:  Negative for pain and visual disturbance.  Respiratory:  Negative for cough and shortness of breath.   Cardiovascular:  Negative for chest pain and palpitations.  Gastrointestinal:  Negative for abdominal  pain and vomiting.  Genitourinary:  Negative for dysuria and hematuria.  Musculoskeletal:  Negative for arthralgias and back pain.  Skin:  Negative for color change and rash.  Neurological:  Positive for headaches. Negative for seizures and syncope.  All other systems reviewed and are negative.  Physical Exam Updated Vital Signs BP (!) 151/100 (BP Location: Right Arm)    Pulse 91    Temp 98.7 F (37.1 C) (Oral)     Resp 16    SpO2 97%  Physical Exam Vitals and nursing note reviewed.  Constitutional:      General: She is not in acute distress.    Appearance: She is well-developed.  HENT:     Head: Normocephalic and atraumatic.  Eyes:     Conjunctiva/sclera: Conjunctivae normal.  Cardiovascular:     Rate and Rhythm: Normal rate and regular rhythm.     Heart sounds: No murmur heard. Pulmonary:     Effort: Pulmonary effort is normal. No respiratory distress.     Breath sounds: Normal breath sounds.  Abdominal:     Palpations: Abdomen is soft.     Tenderness: There is no abdominal tenderness.  Musculoskeletal:        General: No swelling.     Cervical back: Neck supple.  Skin:    General: Skin is warm and dry.     Capillary Refill: Capillary refill takes less than 2 seconds.  Neurological:     Mental Status: She is alert.     Comments: AAOx3 CN 2-12 intact, speech clear visual fields intact 5/5 strength in b/l UE and LE Sensation to light touch intact in b/l UE and LE Normal FNF Normal gait  Psychiatric:        Mood and Affect: Mood normal.    ED Results / Procedures / Treatments   Labs (all labs ordered are listed, but only abnormal results are displayed) Labs Reviewed  COMPREHENSIVE METABOLIC PANEL - Abnormal; Notable for the following components:      Result Value   Calcium 8.8 (*)    Albumin 3.4 (*)    All other components within normal limits  CBC - Abnormal; Notable for the following components:   Hemoglobin 10.5 (*)    MCH 24.2 (*)    MCHC 29.2 (*)    All other components within normal limits  CBG MONITORING, ED  I-STAT BETA HCG BLOOD, ED ([facility], [facility], [facility] ONLY)    EKG EKG Interpretation  Date/Time:  Sunday October 01 2021 19:55:20 EST Ventricular Rate:  81 PR Interval:  112 QRS Duration: 64 QT Interval:  366 QTC Calculation: 425 R Axis:   68 Text Interpretation: Normal sinus rhythm with sinus arrhythmia Normal ECG When compared with ECG of 11-Sep-2018 10:47,  Confirmed by Madalyn Rob [phone]) on 10/01/2021 8:35:59 PM  Radiology DG Chest 2 View  Result Date: 10/01/2021 CLINICAL DATA:  Shortness of breath. EXAM: CHEST - 2 VIEW COMPARISON:  PA Lat 04/17/2015 FINDINGS: There is mild cardiomegaly with normal caliber central vasculature. Chest wall attenuation somewhat limits visualization of the left-greater-than-right lung bases on the PA, but no focal pneumonia is suspected. No pleural effusion is seen. Mild thoracic spondylosis. IMPRESSION: No evidence of acute chest process.  Mild cardiomegaly. Electronically Signed   By: Telford Nab M.D.   On: 10/01/2021 20:51   CT Head Wo Contrast  Result Date: 10/01/2021 CLINICAL DATA:  Headache, sudden, severe EXAM: CT HEAD WITHOUT CONTRAST TECHNIQUE: Contiguous axial images were obtained from the base of the  skull through the vertex without intravenous contrast. RADIATION DOSE REDUCTION: This exam was performed according to the departmental dose-optimization program which includes automated exposure control, adjustment of the mA and/or kV according to patient size and/or use of iterative reconstruction technique. COMPARISON:  04/09/2021 FINDINGS: Brain: No evidence of acute infarction, hemorrhage, hydrocephalus, extra-axial collection or mass lesion/mass effect. Vascular: No hyperdense vessel or unexpected calcification. Skull: Normal. Negative for fracture or focal lesion. Sinuses/Orbits: No acute finding. Other: None. IMPRESSION: No acute intracranial findings. Electronically Signed   By: Davina Poke D.O.   On: 10/01/2021 20:38    Procedures Procedures    Medications Ordered in ED Medications  diphenhydrAMINE (BENADRYL) injection 25 mg (25 mg Intravenous Given 10/01/21 2010)  prochlorperazine (COMPAZINE) injection 10 mg (10 mg Intravenous Given 10/01/21 2010)  sodium chloride 0.9 % bolus 500 mL (0 mLs Intravenous Stopped 10/01/21 2232)    ED Course/ Medical Decision Making/ A&P                            Medical Decision Making Amount and/or Complexity of Data Reviewed Labs: ordered. Radiology: ordered.  Risk Prescription drug management.   36 year old lady presented to ER with concern for bad headache and confusion.  On physical exam she appears well in no acute distress.  No focal neurologic deficit appreciated, answering questions appropriately, speech clear.  Does have history of cerebral palsy, cognitive impairment, seizure disorder.  CT head was checked and negative for acute pathology, no SAH, mass, stroke.  Patient was provided headache cocktail.  On reassessment she had complete resolution of her symptoms.  Given patient has no ongoing symptoms and has no neurologic deficit or complaints, feel she can be discharged home.  Suspect most likely tension type headache versus migraine.  Advised that she follow-up with her neurologist.  Additional history obtained from chart review, review of past neurology notes, discussion with emergency contact listed in epic, stepmother.        Final Clinical Impression(s) / ED Diagnoses Final diagnoses:  Nonintractable headache, unspecified chronicity pattern, unspecified headache type    Rx / DC Orders ED Discharge Orders          Ordered    levETIRAcetam (KEPPRA) 1000 MG tablet  2 times daily        10/01/21 2236              Lucrezia Starch, MD 10/02/21 1505

## 2021-10-01 NOTE — ED Triage Notes (Addendum)
Pt reports confusion and headache that started today after taking seizure medications.  States she is unsure if she had a seizure.  Denies numbness/weakness.

## 2021-10-01 NOTE — ED Notes (Signed)
Pt to CT

## 2021-10-24 NOTE — Progress Notes (Shared)
?[facility] - Clinic Note ? ?10/27/2021 ? ?  ? ?CHIEF COMPLAINT ?Patient presents for No chief complaint on file. ? ? ?HISTORY OF PRESENT ILLNESS: ?Dana Parks is a 36 y.o. female who presents to the clinic today for:  ? ? ?pt lost to follow up since April 2020, she states she has been using Soothe eye drops in the morning and at night, she states they were helping, but her eye is now blurry and painful ? ?Referring physician: ?Fleet Contras, MD ?[address] ?[place],  [place] [address] ? ?HISTORICAL INFORMATION:  ? ?Selected notes from the MEDICAL RECORD NUMBER ?Referred by Dr. Aura Camps for concern of BRAO OD ?LEE: 01.14.20 Judie Petit. [place]) [BCVA: OD: 20/30- OS: 20/25 ?Ocular Hx- ?PMH-anxiety, arthritis, cerebral palsy, headaches, seizures ?  ? ?CURRENT MEDICATIONS: ?No current outpatient medications on file. (Ophthalmic Drugs)  ? ?No current facility-administered medications for this visit. (Ophthalmic Drugs)  ? ?Current Outpatient Medications (Other)  ?Medication Sig  ? albuterol (PROVENTIL HFA;VENTOLIN HFA) 108 (90 BASE) MCG/ACT inhaler Inhale 2 puffs into the lungs every 6 (six) hours as needed for wheezing (wheezing). (Patient taking differently: Inhale 2 puffs into the lungs every 6 (six) hours as needed for wheezing.)  ? budesonide-formoterol (SYMBICORT) 160-4.5 MCG/ACT inhaler Inhale 2 puffs into the lungs 2 (two) times daily. Rinse mouth with water after each use  ? enalapril (VASOTEC) 20 MG tablet Take 1 tablet (20 mg total) by mouth daily.  ? ferrous sulfate 325 (65 FE) MG tablet Take 1 tablet (325 mg total) by mouth 2 (two) times daily with a meal. (Patient not taking: No sig reported)  ? fluticasone (FLONASE) 50 MCG/ACT nasal spray Place 1 spray into both nostrils in the morning and at bedtime. (Patient taking differently: Place 1 spray into both nostrils 2 (two) times daily as needed for allergies.)  ? ibuprofen (ADVIL,MOTRIN) 800 MG tablet Take 1 tablet (800 mg total) by  mouth every 8 (eight) hours as needed for moderate pain. (Patient taking differently: Take 800 mg by mouth 2 (two) times daily.)  ? levETIRAcetam (KEPPRA) 1000 MG tablet Take 1 tablet (1,000 mg total) by mouth 2 (two) times daily.  ? levETIRAcetam (KEPPRA) 500 MG tablet Take 500 mg by mouth 2 (two) times daily.  ? loratadine (CLARITIN) 10 MG tablet Take 1 tablet (10 mg total) by mouth daily. (Patient not taking: Reported on 04/09/2021)  ? montelukast (SINGULAIR) 10 MG tablet Take 1 tablet (10 mg total) by mouth at bedtime.  ? ?Current Facility-Administered Medications (Other)  ?Medication Route  ? diphenhydrAMINE (BENADRYL) injection 25 mg Intramuscular  ? metoCLOPramide (REGLAN) 10 mg in dextrose 5 % 50 mL IVPB Intramuscular  ? ? ? ? ?REVIEW OF SYSTEMS: ? ? ? ? ?ALLERGIES ?Allergies  ?Allergen Reactions  ? Shrimp [Shellfish Allergy] Anaphylaxis  ?  Can eat other shellfish  ? Codeine Other (See Comments)  ?  GI upset  ? Dust Mite Extract Other (See Comments)  ?  Unknown reaction  ? Mold Extract [Trichophyton] Other (See Comments)  ?  Unknown reaction  ? Penicillins Hives and Other (See Comments)  ?  Has patient had a PCN reaction causing immediate rash, facial/tongue/throat swelling, SOB or lightheadedness with hypotension: No ?Has patient had a PCN reaction causing severe rash involving mucus membranes or skin necrosis: No ?Has patient had a PCN reaction that required hospitalization: Yes ?Has patient had a PCN reaction occurring within the last 10 years: No ?If all of the  above answers are "NO", then may proceed with Cephalosporin use. ? ?Unknown reaction; pt has gotten cefazolin  ? Soap Other (See Comments)  ?  Unknown reaction  ? ? ?PAST MEDICAL HISTORY ?Past Medical History:  ?Diagnosis Date  ? Allergic rhinitis   ? Anemia   ? of other chronic disease  ? Anxiety   ? Asthma   ? Back pain   ? unknown   ? Cerebral palsy (HCC)   ? Headache(784.0)   ? frequently  ? Hypertension   ? Incontinence of urine   ? Liver  disease   ? Denies 09/10/2018  ? Low sodium diet   ? Lumbago 05/21/2014  ? pain in lower back   ? Muscle spasm   ? takes Flexeril daily  ? Neuromuscular disorder (HCC)   ? cerebral palsy  ? Numbness   ? bilateral feet  ? Peripheral edema   ? occasionally  ? PONV (postoperative nausea and vomiting)   ? Rhegmatogenous retinal detachment of both eyes   ? Seizure disorder (HCC)   ? Seizures (HCC)   ? last one in high school;takes Depakote daily  ? Wears glasses   ? ?Past Surgical History:  ?Procedure Laterality Date  ? CESAREAN SECTION  2007  ? CESAREAN SECTION WITH BILATERAL TUBAL LIGATION Bilateral 03/03/2015  ? Procedure: CESAREAN SECTION WITH BILATERAL TUBAL LIGATION;  Surgeon: Willodean Rosenthal, MD;  Location: [facility] ORS;  Service: Obstetrics;  Laterality: Bilateral;  Requested 03/03/15 @ 3:30p ? ?Ok per Cleo/Myra-TM  ? GAS/FLUID EXCHANGE Right 09/11/2018  ? Procedure: Gas/Fluid Exchange RIGHT EYE;  Surgeon: Rennis Chris, MD;  Location: [facility] OR;  Service: Ophthalmology;  Laterality: Right;  C3F8  ? LASER PHOTO ABLATION Left 09/11/2018  ? Procedure: LASER PHOTO ABLATION LEFT EYE;  Surgeon: Rennis Chris, MD;  Location: [facility] OR;  Service: Ophthalmology;  Laterality: Left;  ? LEG SURGERY Bilateral   ? PARS PLANA VITRECTOMY Right 09/11/2018  ? Procedure: PARS PLANA VITRECTOMY WITH 25 GAUGE RIGHT EYE;  Surgeon: Rennis Chris, MD;  Location: [facility] OR;  Service: Ophthalmology;  Laterality: Right;  ? PERFLUORONE INJECTION Right 09/11/2018  ? Procedure: Perfluorone Injection RIGHT EYE;  Surgeon: Rennis Chris, MD;  Location: [facility] OR;  Service: Ophthalmology;  Laterality: Right;  ? PHOTOCOAGULATION WITH LASER Right 09/11/2018  ? Procedure: Photocoagulation With Laser RIGHT EYE;  Surgeon: Rennis Chris, MD;  Location: [facility] OR;  Service: Ophthalmology;  Laterality: Right;  ? SCLERAL BUCKLE Right 09/11/2018  ? Procedure: Scleral Buckle RIGHT EYE;  Surgeon: Rennis Chris, MD;  Location: [facility] OR;  Service: Ophthalmology;  Laterality: Right;  ? TOOTH  EXTRACTION N/A 02/16/2013  ? Procedure: EXTRACTION 16, 17, 32;  Surgeon: Georgia Lopes, DDS;  Location: [facility] OR;  Service: Oral Surgery;  Laterality: N/A;  ? ? ?FAMILY HISTORY ?Family History  ?Problem Relation Age of Onset  ? Diabetes Maternal Grandfather   ? Hypertension Maternal Grandfather   ? High blood pressure Mother   ? High blood pressure Other   ?     mother's side   ? Seizures Maternal Uncle   ? ? ?SOCIAL HISTORY ?Social History  ? ?Tobacco Use  ? Smoking status: Every Day  ?  Packs/day: 0.25  ?  Years: 7.00  ?  Pack years: 1.75  ?  Types: Cigarettes  ?  Last attempt to quit: 11/05/2014  ?  Years since quitting: 6.9  ? Smokeless tobacco: Never  ? Tobacco comments:  ?  1 cigar/day  ?Vaping Use  ?  Vaping Use: Never used  ?Substance Use Topics  ? Alcohol use: No  ?  Alcohol/week: 0.0 standard drinks  ? Drug use: Not Currently  ?  Types: Marijuana  ?  Comment: feb 2020 last use  ? ?  ? ?  ? ?OPHTHALMIC EXAM: ? ?Not recorded ?  ? ? ?IMAGING AND PROCEDURES  ?Imaging and Procedures for @TODAY @ ? ? ? ? ?  ?  ? ?  ?ASSESSMENT/PLAN: ? ?  ICD-10-CM   ?1. 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  ?3. Posterior vitreous detachment of left eye  H43.812   ?  ?4. 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  ?5. Myopia of both eyes with astigmatism  H52.13   ? H52.203   ?  ? ?  ? ?1. Bilateral retinal detachments -- OD > OS ? ** lost to f/u since 4.10.2020 (almost 2 years) ** ? - original RDs with fovea on, OD with mild inf nasal macular involvement ? - pt subjectively reports vision issues as far back as "years ago" ? - both eyes suggestive of chronic detachments ? - pre op: ?  - OD with extensive nasal detachment with subretinal bands and early PVR ?  - OS with more peripheral focal rhegmatogenous retinal detachments with some pigmented demarcation lines and limited posterior extension ? - OD s/p SBP + PPV/PFC/EL/FAX/14% C3F8 OD, 2.06.20 ?            - doing well ?             - retina attached and in good position -- good buckle height and laser around breaks ?            - IOP okay at 18 ? - OCT shows retina nicely attached ? - cont Soothe QID OD ? - 3-4+ PSC  post RD repair

## 2021-10-27 ENCOUNTER — Encounter (INDEPENDENT_AMBULATORY_CARE_PROVIDER_SITE_OTHER): Payer: Medicare Other | Admitting: Ophthalmology

## 2021-10-27 DIAGNOSIS — H5213 Myopia, bilateral: Secondary | ICD-10-CM

## 2021-10-27 DIAGNOSIS — H43812 Vitreous degeneration, left eye: Secondary | ICD-10-CM

## 2021-10-27 DIAGNOSIS — H35413 Lattice degeneration of retina, bilateral: Secondary | ICD-10-CM

## 2021-10-27 DIAGNOSIS — H3323 Serous retinal detachment, bilateral: Secondary | ICD-10-CM

## 2021-10-27 DIAGNOSIS — H2513 Age-related nuclear cataract, bilateral: Secondary | ICD-10-CM

## 2022-01-02 ENCOUNTER — Encounter (HOSPITAL_COMMUNITY): Payer: Self-pay

## 2022-01-02 ENCOUNTER — Emergency Department (HOSPITAL_COMMUNITY): Payer: Medicare Other

## 2022-01-02 ENCOUNTER — Other Ambulatory Visit: Payer: Self-pay

## 2022-01-02 ENCOUNTER — Emergency Department (HOSPITAL_COMMUNITY)
Admission: EM | Admit: 2022-01-02 | Discharge: 2022-01-02 | Disposition: A | Discharge status: Home / Self Care | Payer: Medicare Other | Attending: Emergency Medicine | Admitting: Emergency Medicine

## 2022-01-02 DIAGNOSIS — R1084 Generalized abdominal pain: Secondary | ICD-10-CM | POA: Diagnosis not present

## 2022-01-02 DIAGNOSIS — R110 Nausea: Secondary | ICD-10-CM | POA: Diagnosis not present

## 2022-01-02 DIAGNOSIS — R350 Frequency of micturition: Secondary | ICD-10-CM | POA: Insufficient documentation

## 2022-01-02 DIAGNOSIS — Z7251 High risk heterosexual behavior: Secondary | ICD-10-CM | POA: Insufficient documentation

## 2022-01-02 DIAGNOSIS — R519 Headache, unspecified: Secondary | ICD-10-CM | POA: Diagnosis not present

## 2022-01-02 DIAGNOSIS — R42 Dizziness and giddiness: Secondary | ICD-10-CM | POA: Diagnosis not present

## 2022-01-02 DIAGNOSIS — Z79899 Other long term (current) drug therapy: Secondary | ICD-10-CM | POA: Insufficient documentation

## 2022-01-02 DIAGNOSIS — R1030 Lower abdominal pain, unspecified: Secondary | ICD-10-CM

## 2022-01-02 DIAGNOSIS — N83202 Unspecified ovarian cyst, left side: Secondary | ICD-10-CM

## 2022-01-02 LAB — COMPREHENSIVE METABOLIC PANEL
ALT: 20 U/L (ref 0–44)
AST: 29 U/L (ref 15–41)
Albumin: 3.6 g/dL (ref 3.5–5.0)
Alkaline Phosphatase: 75 U/L (ref 38–126)
Anion gap: 8 (ref 5–15)
BUN: 12 mg/dL (ref 6–20)
CO2: 21 mmol/L — ABNORMAL LOW (ref 22–32)
Calcium: 9.1 mg/dL (ref 8.9–10.3)
Chloride: 109 mmol/L (ref 98–111)
Creatinine, Ser: 0.63 mg/dL (ref 0.44–1.00)
GFR, Estimated: >60 mL/min (ref >60)
Glucose, Bld: 87 mg/dL (ref 70–99)
Potassium: 4.1 mmol/L (ref 3.5–5.1)
Sodium: 138 mmol/L (ref 135–145)
Total Bilirubin: 0.6 mg/dL (ref 0.3–1.2)
Total Protein: 8.8 g/dL — ABNORMAL HIGH (ref 6.5–8.1)

## 2022-01-02 LAB — WET PREP, GENITAL
Sperm: NONE SEEN
Trich, Wet Prep: NONE SEEN
WBC, Wet Prep HPF POC: <10 (ref <10)
Yeast Wet Prep HPF POC: NONE SEEN

## 2022-01-02 LAB — CBC
HCT: 37.7 % (ref 36.0–46.0)
Hemoglobin: 10.8 g/dL — ABNORMAL LOW (ref 12.0–15.0)
MCH: 23.6 pg — ABNORMAL LOW (ref 26.0–34.0)
MCHC: 28.6 g/dL — ABNORMAL LOW (ref 30.0–36.0)
MCV: 82.3 fL (ref 80.0–100.0)
Platelets: 305 10*3/uL (ref 150–400)
RBC: 4.58 MIL/uL (ref 3.87–5.11)
RDW: 17.2 % — ABNORMAL HIGH (ref 11.5–15.5)
WBC: 4.1 10*3/uL (ref 4.0–10.5)
nRBC: 0 % (ref 0.0–0.2)

## 2022-01-02 LAB — URINALYSIS, ROUTINE W REFLEX MICROSCOPIC
Bilirubin Urine: NEGATIVE
Glucose, UA: NEGATIVE mg/dL
Hgb urine dipstick: NEGATIVE
Ketones, ur: NEGATIVE mg/dL
Leukocytes,Ua: NEGATIVE
Nitrite: NEGATIVE
Protein, ur: NEGATIVE mg/dL
Specific Gravity, Urine: 1.013 (ref 1.005–1.030)
pH: 6 (ref 5.0–8.0)

## 2022-01-02 LAB — LIPASE, BLOOD: Lipase: 31 U/L (ref 11–51)

## 2022-01-02 LAB — HCG, QUANTITATIVE, PREGNANCY: hCG, Beta Chain, Quant, S: <1 m[IU]/mL (ref <5)

## 2022-01-02 MED ORDER — DEXTROSE 5 % IV SOLN: 500.0000 mg | Freq: Once | INTRAVENOUS | Status: DC

## 2022-01-02 MED ORDER — STERILE WATER FOR INJECTION IJ SOLN
INTRAMUSCULAR | Status: AC
  Administered 2022-01-02: 2.1 mL
  Filled 2022-01-02: qty 10

## 2022-01-02 MED ORDER — CEFTRIAXONE SODIUM 1 G IJ SOLR
500.0000 mg | Freq: Once | INTRAMUSCULAR | Status: AC
  Administered 2022-01-02: 500 mg via INTRAMUSCULAR
  Filled 2022-01-02: qty 10

## 2022-01-02 MED ORDER — OXYCODONE-ACETAMINOPHEN 5-325 MG PO TABS
2.0000 | ORAL_TABLET | Freq: Once | ORAL | Status: AC
  Administered 2022-01-02: 2 via ORAL
  Filled 2022-01-02: qty 2

## 2022-01-02 MED ORDER — IOHEXOL 300 MG/ML  SOLN
100.0000 mL | Freq: Once | INTRAMUSCULAR | Status: AC | PRN
  Administered 2022-01-02: 100 mL via INTRAVENOUS

## 2022-01-02 NOTE — Discharge Instructions (Signed)
As discussed, your evaluation today has been largely reassuring.  But, it is important that you monitor your condition carefully, and do not hesitate to return to the ED if you develop new, or concerning changes in your condition. ? ?Otherwise, please follow-up with your physician for appropriate ongoing care. ? ?

## 2022-01-02 NOTE — ED Triage Notes (Addendum)
Patient has multiple complaints. Patient c/o headache, abdominal cramping, sore breasts, nausea, urinary frequency, and dizziness x 8 days. Patient states symptoms worse last night.  Patient states she began having "stabbing pains in my private area last night."

## 2022-01-02 NOTE — ED Provider Notes (Signed)
[facility]-EMERGENCY DEPT Provider Note   CSN: [id number] Arrival date & time: 01/02/22  1321     History Chief Complaint  Patient presents with   Nausea   Dizziness   Headache   Abdominal Pain   Urinary Frequency    Dana Parks is a 36 y.o. female.  Patient presents with abdominal pain, increased urinary frequency and nausea that has been ongoing for more than a week. She comes in today because she thought her abdominal pain was due to menstruation, LMP 5/17, but her symptoms have not improved. Intermittent abdominal pain feels like a sharp, stabbing pain more prominently along the lower quadrants bilaterally which radiates to the back. Aggravating factors including sleeping on her side and relieving factors include sleeping on her back. Had one episode of emesis yesterday. Denies hematuria, hematemesis, chest pain, fever and chills. Also complains of dizziness that only occurs when she is getting up but not every time. Denies any history of sexually transmitted infections but had unprotected intercourse about a week ago when her symptoms started which is also when she started to have white dischage.   The history is provided by the patient. No language interpreter was used.  Dizziness Associated symptoms: headaches   Headache Associated symptoms: abdominal pain and dizziness   Abdominal Pain Urinary Frequency Associated symptoms include abdominal pain and headaches.      Home Medications Prior to Admission medications   Medication Sig Start Date End Date Taking? Authorizing Provider  albuterol (PROVENTIL HFA;VENTOLIN HFA) 108 (90 BASE) MCG/ACT inhaler Inhale 2 puffs into the lungs every 6 (six) hours as needed for wheezing (wheezing). Patient taking differently: Inhale 2 puffs into the lungs every 6 (six) hours as needed for wheezing. 09/09/14   Anyanwu, Jethro BastosUgonna A, MD  budesonide-formoterol (SYMBICORT) 160-4.5 MCG/ACT inhaler Inhale 2 puffs into the lungs  2 (two) times daily. Rinse mouth with water after each use 12/23/20   Particia NearingLane, Rachel Elizabeth, PA-C  enalapril (VASOTEC) 20 MG tablet Take 1 tablet (20 mg total) by mouth daily. 03/14/15   Levie HeritageStinson, Jacob J, DO  ferrous sulfate 325 (65 FE) MG tablet Take 1 tablet (325 mg total) by mouth 2 (two) times daily with a meal. Patient not taking: No sig reported 03/05/15   Lynnae PrudeAllen, Daniel Landon, MD  fluticasone [facility](FLONASE) 50 MCG/ACT nasal spray Place 1 spray into both nostrils in the morning and at bedtime. Patient taking differently: Place 1 spray into both nostrils 2 (two) times daily as needed for allergies. 12/23/20   Particia NearingLane, Rachel Elizabeth, PA-C  ibuprofen (ADVIL,MOTRIN) 800 MG tablet Take 1 tablet (800 mg total) by mouth every 8 (eight) hours as needed for moderate pain. Patient taking differently: Take 800 mg by mouth 2 (two) times daily. 05/13/17   Ward, Chase PicketJaime Pilcher, PA-C  levETIRAcetam (KEPPRA) 1000 MG tablet Take 1 tablet (1,000 mg total) by mouth 2 (two) times daily. 10/01/21   Milagros Lollykstra, Richard S, MD  levETIRAcetam (KEPPRA) 500 MG tablet Take 500 mg by mouth 2 (two) times daily. 10/21/20   [provider]  loratadine (CLARITIN) 10 MG tablet Take 1 tablet (10 mg total) by mouth daily. Patient not taking: Reported on 04/09/2021 12/23/20   Particia NearingLane, Rachel Elizabeth, PA-C  montelukast (SINGULAIR) 10 MG tablet Take 1 tablet (10 mg total) by mouth at bedtime. 12/23/20   Particia NearingLane, Rachel Elizabeth, PA-C      Allergies    Shrimp [shellfish allergy], Codeine, Dust mite extract, Mold extract [trichophyton], Penicillins, and Soap  Review of Systems   Review of Systems  Gastrointestinal:  Positive for abdominal pain.  Genitourinary:  Positive for frequency.  Neurological:  Positive for dizziness and headaches.  All other systems reviewed and are negative.  Physical Exam Updated Vital Signs BP (!) 147/98 (BP Location: Left Arm)   Pulse 92   Temp 98.7 F (37.1 C) (Oral)   Resp 20   Ht 4\' 11"  (1.499 m)    Wt 70.3 kg   LMP 12/20/2021   SpO2 100%   BMI 31.31 kg/m  Physical Exam Exam conducted with a chaperone present.  Constitutional:      General: She is not in acute distress.    Appearance: She is well-developed. She is not ill-appearing.  HENT:     Head: Normocephalic and atraumatic.     Mouth/Throat:     Mouth: Mucous membranes are moist.     Pharynx: Oropharynx is clear.  Eyes:     General: No scleral icterus.    Extraocular Movements: Extraocular movements intact.     Pupils: Pupils are equal, round, and reactive to light. Pupils are equal.  Cardiovascular:     Rate and Rhythm: Normal rate and regular rhythm.     Heart sounds: Normal heart sounds. No murmur heard.   No gallop.  Pulmonary:     Effort: Pulmonary effort is normal. No respiratory distress.     Breath sounds: Normal breath sounds. No stridor. No wheezing or rales.  Abdominal:     General: Bowel sounds are normal.     Palpations: Abdomen is soft.     Tenderness: There is abdominal tenderness (generalized tenderness noted upon deep palpation).     Hernia: There is no hernia in the left inguinal area or right inguinal area.  Genitourinary:    General: Normal vulva.     Exam position: Lithotomy position.     Pubic Area: No rash or pubic lice.      Labia:        Right: No rash, tenderness or lesion.        Left: No rash, tenderness or lesion.      Comments: Presence of adnexal tenderness bilaterally, no vaginal or cervical discharge noted on exam  Musculoskeletal:        General: No swelling or tenderness. Normal range of motion.     Cervical back: Normal range of motion and neck supple.  Lymphadenopathy:     Cervical: No cervical adenopathy.     Lower Body: No right inguinal adenopathy. No left inguinal adenopathy.  Skin:    General: Skin is warm and dry.     Coloration: Skin is not cyanotic or pale.     Findings: No erythema.  Neurological:     Mental Status: She is alert.    ED Results / Procedures /  Treatments   Labs (all labs ordered are listed, but only abnormal results are displayed) Labs Reviewed  WET PREP, GENITAL - Abnormal; Notable for the following components:      Result Value   Clue Cells Wet Prep HPF POC PRESENT (*)    All other components within normal limits  CBC - Abnormal; Notable for the following components:   Hemoglobin 10.8 (*)    MCH 23.6 (*)    MCHC 28.6 (*)    RDW 17.2 (*)    All other components within normal limits  URINALYSIS, ROUTINE W REFLEX MICROSCOPIC  COMPREHENSIVE METABOLIC PANEL  LIPASE, BLOOD  HCG, QUANTITATIVE, PREGNANCY  GC/CHLAMYDIA PROBE  AMP ([place]) NOT AT [facility]    EKG None  Radiology No results found.  Procedures Procedures  None  Medications Ordered in ED Medications  cefTRIAXone (ROCEPHIN) 500 mg in dextrose 5 % 50 mL IVPB (has no administration in time range)  oxyCODONE-acetaminophen (PERCOCET/ROXICET) 5-325 MG per tablet 2 tablet (2 tablets Oral Given 01/02/22 1622)    ED Course/ Medical Decision Making/ A&P                           Medical Decision Making Amount and/or Complexity of Data Reviewed Labs: ordered. Radiology: ordered.  Risk Prescription drug management.   Patient presents with abdominal pain, urinary frequency and a multitude of other symptoms. She reports that she attempted to reach out to her PCP but was not able to be seen today. Exam significant for generalized abdominal tenderness upon deep palpation resembling pain out of proportion. Dizziness possibly due to orthostatic hypotension and inadequate dehydration possibly causing tension headache, orthostatic vitals positive. Differentials for abdominal pain remain broad including ectopic pregnancy, renal stones, PID, ovarian torsion, tuboovarian abscess, pancreatitis and appendicitis. Percocet x1 for pain. UA noncontributory and no leukocytosis. Bilateral adnexal tenderness noted on pelvic exam. Based on pelvic exam, symptoms seem most consistent  with GU etiology. Wet prep performed in the presence of chaperone and noted to have presence of clue cells. Awaiting blood work including lipase. Pending quant beta hCG and then may proceed with CT abdomen/pelvis to evaluate for possible GU etiology. Ongoing workup pending at time of shift change, patient signed out to oncoming provider. Anticipate she will need antibiotic treatment.    Final Clinical Impression(s) / ED Diagnoses Final diagnoses:  Lower abdominal pain    Rx / DC Orders ED Discharge Orders          Ordered    Beta HCG, Quant        01/02/22 1600              Reece Leader, DO 01/02/22 1633    Lorre Nick, MD 01/03/22 1452

## 2022-01-02 NOTE — ED Provider Notes (Signed)
9:07 PM Care of the patient assumed at signout.  Ultrasound results now available, discussed with the patient, concern for ovarian cyst likely contributing to her pain.  Patient is awake, alert, receptive to the news, will follow-up as an outpatient as needed.   Gerhard Munch, MD 01/02/22 2107

## 2022-01-03 LAB — GC/CHLAMYDIA PROBE AMP (~~LOC~~) NOT AT ARMC
Chlamydia: NEGATIVE
Comment: NEGATIVE
Comment: NORMAL
Neisseria Gonorrhea: NEGATIVE

## 2022-02-19 ENCOUNTER — Telehealth: Payer: Self-pay | Admitting: General Practice

## 2022-02-19 NOTE — Telephone Encounter (Signed)
Patient called into office and left voicemail message stating she has a cyst on her left ovary and wants to know what can be done about that. She states she has been having pain and a fever.   Called patient and discussed with her that the cyst that was seen on her CT scan back in May is a normal cyst and would have resolved by now. Discussed with patient that the symptoms she is having is unrelated to the cyst and recommended she call her family doctor/PCP for follow up. Patient verbalized understanding.

## 2022-03-08 ENCOUNTER — Other Ambulatory Visit (HOSPITAL_COMMUNITY)
Admission: RE | Admit: 2022-03-08 | Discharge: 2022-03-08 | Disposition: A | Discharge status: Home / Self Care | Payer: Medicare Other | Source: Ambulatory Visit | Attending: Obstetrics & Gynecology | Admitting: Obstetrics & Gynecology

## 2022-03-08 ENCOUNTER — Ambulatory Visit (INDEPENDENT_AMBULATORY_CARE_PROVIDER_SITE_OTHER): Payer: Medicare Other | Admitting: Obstetrics & Gynecology

## 2022-03-08 ENCOUNTER — Encounter: Payer: Self-pay | Admitting: Obstetrics & Gynecology

## 2022-03-08 VITALS — BP 161/107 | HR 82 | Wt 150.6 lb

## 2022-03-08 DIAGNOSIS — I10 Essential (primary) hypertension: Secondary | ICD-10-CM

## 2022-03-08 DIAGNOSIS — G8929 Other chronic pain: Secondary | ICD-10-CM | POA: Diagnosis present

## 2022-03-08 DIAGNOSIS — Z113 Encounter for screening for infections with a predominantly sexual mode of transmission: Secondary | ICD-10-CM

## 2022-03-08 DIAGNOSIS — Z01419 Encounter for gynecological examination (general) (routine) without abnormal findings: Secondary | ICD-10-CM

## 2022-03-08 DIAGNOSIS — B9689 Other specified bacterial agents as the cause of diseases classified elsewhere: Secondary | ICD-10-CM

## 2022-03-08 DIAGNOSIS — N760 Acute vaginitis: Secondary | ICD-10-CM

## 2022-03-08 DIAGNOSIS — R102 Pelvic and perineal pain: Secondary | ICD-10-CM

## 2022-03-08 DIAGNOSIS — Z1151 Encounter for screening for human papillomavirus (HPV): Secondary | ICD-10-CM | POA: Insufficient documentation

## 2022-03-08 MED ORDER — ENALAPRIL MALEATE 20 MG PO TABS: 20.0000 mg | ORAL_TABLET | Freq: Every day | ORAL | 1 refills | Status: DC

## 2022-03-08 NOTE — Progress Notes (Signed)
GYNECOLOGY ANNUAL PREVENTATIVE CARE ENCOUNTER NOTE  History:     Dana Parks is a 36 y.o. G10P2002 female here for a routine annual gynecologic exam.  Current complaints: occasional left sided pelvic pain. Had a 18 mm collapsing left ovarian cyst seen on CT scan in May 2023.  Also had one month where she had two periods. She is worried about the ovarian cyst.  Denies any current abnormal vaginal bleeding, discharge, pelvic pain, problems with intercourse or other gynecologic concerns. Desires annual STI screen today.  Of note, patient reports occasional left facial numbness and pain, her HTN is untreated. She does have history of CP, no concerning weakness of her limbs, headaches, facial droopiness, speech difficulties or other concerns.    Gynecologic History No LMP recorded. Contraception: tubal ligation Last Pap: 2016. Result was normal.   Obstetric History OB History  Gravida Para Term Preterm AB Living  2 2 2  0 0 2  SAB IAB Ectopic Multiple Live Births  0 0 0 0 2    # Outcome Date GA Lbr Len/2nd Weight Sex Delivery Anes PTL Lv  2 Term 03/03/15 [redacted]w[redacted]d  7 lb 13.6 oz (3.56 kg) F CS-LTranv Spinal  LIV  1 Term 12/19/05 [redacted]w[redacted]d  7 lb 11 oz (3.487 kg) F CS-Unspec Spinal  LIV     Complications: Cephalopelvic Disproportion, Hypertension affecting pregnancy, antepartum, third trimester    Past Medical History:  Diagnosis Date   Allergic rhinitis    Anemia    of other chronic disease   Anxiety    Asthma    Back pain    unknown    Cerebral palsy (HCC)    Headache(784.0)    frequently   Hypertension    Incontinence of urine    Liver disease    Denies 09/10/2018   Low sodium diet    Lumbago 05/21/2014   pain in lower back    Muscle spasm    takes Flexeril daily   Neuromuscular disorder (HCC)    cerebral palsy   Numbness    bilateral feet   Peripheral edema    occasionally   PONV (postoperative nausea and vomiting)    Rhegmatogenous retinal detachment of both eyes     Seizure disorder (HCC)    Seizures (HCC)    last one in high school;takes Depakote daily   Wears glasses     Past Surgical History:  Procedure Laterality Date   CESAREAN SECTION  2007   CESAREAN SECTION WITH BILATERAL TUBAL LIGATION Bilateral 03/03/2015   Procedure: CESAREAN SECTION WITH BILATERAL TUBAL LIGATION;  Surgeon: 03/05/2015, MD;  Location: [facility] ORS;  Service: Obstetrics;  Laterality: Bilateral;  Requested 03/03/15 @ 3:30p  Ok per Cleo/Myra-TM   GAS/FLUID EXCHANGE Right 09/11/2018   Procedure: Gas/Fluid Exchange RIGHT EYE;  Surgeon: 11/10/2018, MD;  Location: [facility] OR;  Service: Ophthalmology;  Laterality: Right;  [id number]   LASER PHOTO ABLATION Left 09/11/2018   Procedure: LASER PHOTO ABLATION LEFT EYE;  Surgeon: 11/10/2018, MD;  Location: [facility] OR;  Service: Ophthalmology;  Laterality: Left;   LEG SURGERY Bilateral    PARS PLANA VITRECTOMY Right 09/11/2018   Procedure: PARS PLANA VITRECTOMY WITH 25 GAUGE RIGHT EYE;  Surgeon: 11/10/2018, MD;  Location: [facility] OR;  Service: Ophthalmology;  Laterality: Right;   PERFLUORONE INJECTION Right 09/11/2018   Procedure: Perfluorone Injection RIGHT EYE;  Surgeon: 11/10/2018, MD;  Location: [facility] OR;  Service: Ophthalmology;  Laterality: Right;   PHOTOCOAGULATION WITH LASER Right  09/11/2018   Procedure: Photocoagulation With Laser RIGHT EYE;  Surgeon: Rennis Chris, MD;  Location[facility] OR;  Service: Ophthalmology;  Laterality: Right;   SCLERAL BUCKLE Right 09/11/2018   Procedure: Scleral Buckle RIGHT EYE;  Surgeon: Rennis Chris, MD;  Location: [facility] OR;  Service: Ophthalmology;  Laterality: Right;   TOOTH EXTRACTION N/A 02/16/2013   Procedure: EXTRACTION 16, 17, 32;  Surgeon: Georgia Lopes, DDS;  Location: [facility] OR;  Service: Oral Surgery;  Laterality: N/A;    Current Outpatient Medications on File Prior to Visit  Medication Sig Dispense Refill   albuterol (PROVENTIL HFA;VENTOLIN HFA) 108 (90 BASE) MCG/ACT inhaler Inhale 2 puffs into the lungs every 6  (six) hours as needed for wheezing (wheezing). (Patient taking differently: Inhale 2 puffs into the lungs every 6 (six) hours as needed for wheezing.) 1 Inhaler 5   budesonide-formoterol (SYMBICORT) 160-4.5 MCG/ACT inhaler Inhale 2 puffs into the lungs 2 (two) times daily. Rinse mouth with water after each use 1 each 3   ferrous sulfate 325 (65 FE) MG tablet Take 1 tablet (325 mg total) by mouth 2 (two) times daily with a meal. 60 tablet 3   fluticasone (FLONASE) 50 MCG/ACT nasal spray Place 1 spray into both nostrils in the morning and at bedtime. (Patient taking differently: Place 1 spray into both nostrils 2 (two) times daily as needed for allergies.) 16 g 3   ibuprofen (ADVIL,MOTRIN) 800 MG tablet Take 1 tablet (800 mg total) by mouth every 8 (eight) hours as needed for moderate pain. (Patient taking differently: Take 800 mg by mouth 2 (two) times daily.) 21 tablet 0   levETIRAcetam (KEPPRA) 1000 MG tablet Take 1 tablet (1,000 mg total) by mouth 2 (two) times daily. 60 tablet 0   levETIRAcetam (KEPPRA) 500 MG tablet Take 500 mg by mouth 2 (two) times daily.     loratadine (CLARITIN) 10 MG tablet Take 1 tablet (10 mg total) by mouth daily. 30 tablet 3   montelukast (SINGULAIR) 10 MG tablet Take 1 tablet (10 mg total) by mouth at bedtime. 30 tablet 3   Current Facility-Administered Medications on File Prior to Visit  Medication Dose Route Frequency Provider Last Rate Last Admin   diphenhydrAMINE (BENADRYL) injection 25 mg  25 mg Intramuscular Once Van Clines, MD       metoCLOPramide (REGLAN) 10 mg in dextrose 5 % 50 mL IVPB  10 mg Intramuscular Once Van Clines, MD        Allergies  Allergen Reactions   Shrimp [Shellfish Allergy] Anaphylaxis    Can eat other shellfish   Codeine Other (See Comments)    GI upset   Dust Mite Extract Other (See Comments)    Unknown reaction   Mold Extract [Trichophyton] Other (See Comments)    Unknown reaction   Penicillins Hives and Other (See  Comments)    Has patient had a PCN reaction causing immediate rash, facial/tongue/throat swelling, SOB or lightheadedness with hypotension: No Has patient had a PCN reaction causing severe rash involving mucus membranes or skin necrosis: No Has patient had a PCN reaction that required hospitalization: Yes Has patient had a PCN reaction occurring within the last 10 years: No If all of the above answers are "NO", then may proceed with Cephalosporin use.  Unknown reaction; pt has gotten cefazolin   Soap Other (See Comments)    Unknown reaction    Social History:  reports that she quit smoking about 7 years ago. Her smoking use included cigarettes.  She has a 1.75 pack-year smoking history. She has never used smokeless tobacco. She reports current drug use. Drug: Marijuana. She reports that she does not drink alcohol.  Family History  Problem Relation Age of Onset   Diabetes Maternal Grandfather    Hypertension Maternal Grandfather    High blood pressure Mother    High blood pressure Other        mother's side    Seizures Maternal Uncle     The following portions of the patient's history were reviewed and updated as appropriate: allergies, current medications, past family history, past medical history, past social history, past surgical history and problem list.  Review of Systems Pertinent items noted in HPI and remainder of comprehensive ROS otherwise negative.  Physical Exam:  BP (!) 161/107   Pulse 82   Wt 150 lb 9.6 oz (68.3 kg)   BMI 30.42 kg/m  CONSTITUTIONAL: Well-developed, well-nourished female in no acute distress.  HENT:  Normocephalic, atraumatic, External right and left ear normal.  EYES: Conjunctivae and EOM are normal. Pupils are equal, round, and reactive to light. No scleral icterus.  NECK: Normal range of motion, supple, no masses.  Normal thyroid.  SKIN: Skin is warm and dry. No rash noted. Not diaphoretic. No erythema. No pallor. MUSCULOSKELETAL: Normal range  of motion. No tenderness.  No cyanosis, clubbing, or edema. NEUROLOGIC: Alert and oriented to person, place, and time. Normal reflexes, muscle tone coordination.  PSYCHIATRIC: Normal mood and affect. Normal behavior. Normal judgment and thought content. CARDIOVASCULAR: Normal heart rate noted, regular rhythm RESPIRATORY: Clear to auscultation bilaterally. Effort and breath sounds normal, no problems with respiration noted. BREASTS: Symmetric in size. No masses, tenderness, skin changes, nipple drainage, or lymphadenopathy bilaterally. Performed in the presence of a chaperone. ABDOMEN: Soft, no distention noted.  No tenderness, rebound or guarding.  PELVIC: Normal appearing external genitalia and urethral meatus; normal appearing vaginal mucosa and cervix.  No abnormal vaginal discharge noted.  Pap smear obtained, some bleeding noted afterwards.  Normal uterine size, no other palpable masses, no uterine or adnexal tenderness.  Performed in the presence of a chaperone.   Assessment and Plan:     1. Chronic female pelvic pain Will follow up pelvic cultures and pelvic ultrasound, reassured about physiologic cyst seen on CT scan. Will follow up results and manage accordingly. NSAIDs recommended as needed for now. - Cervicovaginal ancillary only( [place]) - US PELVIC COMPLETE WITH TRANSVAGINAL; Future  2. Benign essential HTN Worried about her high BP, cautioned about increased risk of stroke and other complications. Advised to follow up with PCP ASAP. Refilled her Vasotec today. Told to go to ER for any facial numbness or other symptoms.  - enalapril (VASOTEC) 20 MG tablet; Take 1 tablet (20 mg total) by mouth daily.  Dispense: 30 tablet; Refill: 1  3. Routine screening for STI (sexually transmitted infection) STI screen done, will follow up results and manage accordingly. - Cytology - PAP( [place]) - RPR+HBsAg+HCVAb+HIV  4. Well woman exam with routine gynecological exam - Cytology -  PAP( ) Will follow up results of pap smear and manage accordingly. Routine preventative health maintenance measures emphasized. Please refer to After Visit Summary for other counseling recommendations.      Jaynie Collins, MD, FACOG Obstetrician & Gynecologist, [facility] for [employer], [facility]

## 2022-03-09 LAB — CERVICOVAGINAL ANCILLARY ONLY
Bacterial Vaginitis (gardnerella): POSITIVE — AB
Candida Glabrata: NEGATIVE
Candida Vaginitis: NEGATIVE
Chlamydia: NEGATIVE
Comment: NEGATIVE
Comment: NEGATIVE
Comment: NEGATIVE
Comment: NEGATIVE
Comment: NEGATIVE
Comment: NORMAL
Neisseria Gonorrhea: NEGATIVE
Trichomonas: NEGATIVE

## 2022-03-09 LAB — RPR+HBSAG+HCVAB+...
HIV Screen 4th Generation wRfx: NONREACTIVE
Hep C Virus Ab: NONREACTIVE
Hepatitis B Surface Ag: NEGATIVE
RPR Ser Ql: NONREACTIVE

## 2022-03-09 MED ORDER — METRONIDAZOLE 500 MG PO TABS: 500.0000 mg | ORAL_TABLET | Freq: Two times a day (BID) | ORAL | 0 refills | Status: AC

## 2022-03-09 NOTE — Addendum Note (Signed)
Addended by: Jaynie Collins A on: 03/09/2022 11:28 AM   Modules accepted: Orders

## 2022-03-12 ENCOUNTER — Telehealth: Payer: Self-pay | Admitting: *Deleted

## 2022-03-12 NOTE — Telephone Encounter (Signed)
-----   Message from Dana Newcomer, MD sent at 03/09/2022 11:28 AM EDT ----- Vaginal discharge test is abnormal and showed bacterial vaginitis. Metronidazole prescribed.  Please inform patient of results, advise to pick up prescribed medication and take as directed.

## 2022-03-12 NOTE — Telephone Encounter (Signed)
I called Dana Parks and left a message I am calling with some nonurgent results and information from your provider, please call our office back. [name]

## 2022-03-13 NOTE — Telephone Encounter (Signed)
Called pt and reviewed results and medication sent to pharmacy. Reviewed [place] appt on 03/15/22.

## 2022-03-14 LAB — CYTOLOGY - PAP
Comment: NEGATIVE
Diagnosis: NEGATIVE
High risk HPV: NEGATIVE

## 2022-03-15 ENCOUNTER — Other Ambulatory Visit: Payer: Medicare Other

## 2022-03-22 ENCOUNTER — Ambulatory Visit
Admission: RE | Admit: 2022-03-22 | Discharge: 2022-03-22 | Disposition: A | Discharge status: Home / Self Care | Payer: Medicare Other | Source: Ambulatory Visit | Attending: Obstetrics & Gynecology | Admitting: Obstetrics & Gynecology

## 2022-03-22 DIAGNOSIS — R102 Pelvic and perineal pain: Secondary | ICD-10-CM | POA: Diagnosis not present

## 2022-03-22 DIAGNOSIS — G8929 Other chronic pain: Secondary | ICD-10-CM | POA: Diagnosis present

## 2022-03-23 ENCOUNTER — Telehealth: Payer: Self-pay | Admitting: *Deleted

## 2022-03-23 NOTE — Telephone Encounter (Addendum)
-----   Message from [name], MD sent at 03/22/2022 11:49 PM EDT ----- Please inform patient of normal ultrasound demonstrating a normal size uterus without fibroids and normal ovaries. Her left ovary has a new small cyst (different from the one seen in May 2023 as it is smaller) and it is a cyst preparing for ovulation. No further interventions are needed  8/18  0945  Called pt and informed her of ultrasound results as stated by Dr. Jolayne Panther. She voiced understanding and had no questions.

## 2023-03-16 ENCOUNTER — Ambulatory Visit (HOSPITAL_COMMUNITY)
Admission: EM | Admit: 2023-03-16 | Discharge: 2023-03-16 | Disposition: A | Discharge status: Home / Self Care | Payer: 59 | Attending: Physician Assistant | Admitting: Physician Assistant

## 2023-03-16 DIAGNOSIS — N760 Acute vaginitis: Secondary | ICD-10-CM | POA: Insufficient documentation

## 2023-03-16 DIAGNOSIS — I10 Essential (primary) hypertension: Secondary | ICD-10-CM | POA: Diagnosis not present

## 2023-03-16 LAB — POCT URINALYSIS DIP (MANUAL ENTRY)
Bilirubin, UA: NEGATIVE
Glucose, UA: NEGATIVE mg/dL
Ketones, POC UA: NEGATIVE mg/dL
Leukocytes, UA: NEGATIVE
Nitrite, UA: POSITIVE — AB
Protein Ur, POC: 100 mg/dL — AB
Spec Grav, UA: 1.02 (ref 1.010–1.025)
Urobilinogen, UA: 0.2 E.U./dL
pH, UA: 7 (ref 5.0–8.0)

## 2023-03-16 LAB — POCT URINE PREGNANCY: Preg Test, Ur: NEGATIVE

## 2023-03-16 MED ORDER — AMLODIPINE BESYLATE 5 MG PO TABS: 5.0000 mg | ORAL_TABLET | Freq: Every day | ORAL | 2 refills | Status: DC

## 2023-03-16 MED ORDER — METRONIDAZOLE 500 MG PO TABS: 500.0000 mg | ORAL_TABLET | Freq: Two times a day (BID) | ORAL | 0 refills | Status: DC

## 2023-03-16 MED ORDER — NITROFURANTOIN MONOHYD MACRO 100 MG PO CAPS: 100.0000 mg | ORAL_CAPSULE | Freq: Two times a day (BID) | ORAL | 0 refills | Status: AC

## 2023-03-16 NOTE — Discharge Instructions (Addendum)
Take antibiotic as prescribed Will call with test results and change treatment plan if needed

## 2023-03-16 NOTE — ED Provider Notes (Signed)
[facility]    CSN: [id number] Arrival date & time: 03/16/23  1350      History   Chief Complaint No chief complaint on file.   HPI Dana Parks is a 37 y.o. female.   Patient presents with vaginal discharge and odor that started about 2 weeks ago.  She reports some dysuria.  Denies belly pain, flank pain, fever, chills, pelvic pain.  She reports she was diagnosed with chlamydia several months ago and did not complete treatment, but symptoms did improve.  Reports new vaginal symptoms started 2 weeks ago.  She also has a history of hypertension reports she has been out of her medication for 2 months.  She is unsure what medication she was on.  Reports medicines were supposed to be mailed to her house, but have not been.  Denies chest pain, shortness of breath, headache, syncope, lower extremity swelling.    Past Medical History:  Diagnosis Date   Allergic rhinitis    Anemia    of other chronic disease   Anxiety    Asthma    Back pain    unknown    Cerebral palsy (HCC)    Headache(784.0)    frequently   Hypertension    Incontinence of urine    Liver disease    Denies 09/10/2018   Low sodium diet    Lumbago 05/21/2014   pain in lower back    Muscle spasm    takes Flexeril daily   Neuromuscular disorder (HCC)    cerebral palsy   Numbness    bilateral feet   Peripheral edema    occasionally   PONV (postoperative nausea and vomiting)    Rhegmatogenous retinal detachment of both eyes    Seizure disorder (HCC)    Seizures (HCC)    last one in high school;takes Depakote daily   Wears glasses     Patient Active Problem List   Diagnosis Date Noted   Intractable migraine with aura with status migrainosus 01/08/2017   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   Asthma, mild intermittent, well-controlled 12/13/2014   Low lying placenta without hemorrhage, antepartum    Epilepsy affecting pregnancy, 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   Asthma complicating pregnancy, antepartum  09/09/2014   Cerebral palsy (HCC)    Seizure disorder (HCC)     Past Surgical History:  Procedure Laterality Date   CESAREAN SECTION  2007   CESAREAN SECTION WITH BILATERAL TUBAL LIGATION Bilateral 03/03/2015   Procedure: CESAREAN SECTION WITH BILATERAL TUBAL LIGATION;  Surgeon: Willodean Rosenthal, MD;  Location: [facility];  Service: Obstetrics;  Laterality: Bilateral;  Requested 03/03/15 @ 3:30p  Ok per Cleo/Myra-TM   GAS/FLUID EXCHANGE Right 09/11/2018   Procedure: Gas/Fluid Exchange RIGHT EYE;  Surgeon: Rennis Chris, MD;  Location[facility] OR;  Service: Ophthalmology;  Laterality: Right;  [id number]   LASER PHOTO ABLATION Left 09/11/2018   Procedure: LASER PHOTO ABLATION LEFT EYE;  Surgeon: Rennis Chris, MD;  Location: [facility] OR;  Service: Ophthalmology;  Laterality: Left;   LEG SURGERY Bilateral    PARS PLANA VITRECTOMY Right 09/11/2018   Procedure: PARS PLANA VITRECTOMY WITH 25 GAUGE RIGHT EYE;  Surgeon: Rennis Chris, MD;  Location: [facility] OR;  Service: Ophthalmology;  Laterality: Right;   PERFLUORONE INJECTION Right 09/11/2018   Procedure: Perfluorone Injection RIGHT EYE;  Surgeon: Rennis Chris, MD;  Location: [facility] OR;  Service: Ophthalmology;  Laterality: Right;   PHOTOCOAGULATION WITH LASER Right 09/11/2018   Procedure: Photocoagulation With Laser RIGHT EYE;  Surgeon: Rennis Chris, MD;  Location: [facility] OR;  Service: Ophthalmology;  Laterality: Right;   SCLERAL BUCKLE Right 09/11/2018   Procedure: Scleral Buckle RIGHT EYE;  Surgeon: Rennis Chris, MD;  Location: [facility] OR;  Service: Ophthalmology;  Laterality: Right;   TOOTH EXTRACTION N/A 02/16/2013   Procedure: EXTRACTION 16, 17, 32;  Surgeon: Georgia Lopes, DDS;  Location: [facility] OR;  Service: Oral Surgery;  Laterality: N/A;    OB History     Gravida  2   Para  2   Term  2   Preterm  0   AB  0   Living  2      SAB  0   IAB  0   Ectopic  0   Multiple  0   Live Births  2            Home Medications    Prior to Admission medications    Medication Sig Start Date End Date Taking? Authorizing Provider  amLODipine (NORVASC) 5 MG tablet Take 1 tablet (5 mg total) by mouth daily. 03/16/23  Yes Ward, Tylene Fantasia, PA-C  levETIRAcetam (KEPPRA) 1000 MG tablet Take 1 tablet (1,000 mg total) by mouth 2 (two) times daily. 10/01/21  Yes Milagros Loll, MD  levETIRAcetam (KEPPRA) 500 MG tablet Take 500 mg by mouth 2 (two) times daily. 10/21/20  Yes [provider]  loratadine (CLARITIN) 10 MG tablet Take 1 tablet (10 mg total) by mouth daily. 12/23/20  Yes Particia Nearing, PA-C  metroNIDAZOLE (FLAGYL) 500 MG tablet Take 1 tablet (500 mg total) by mouth 2 (two) times daily. 03/16/23  Yes Ward, Tylene Fantasia, PA-C  montelukast (SINGULAIR) 10 MG tablet Take 1 tablet (10 mg total) by mouth at bedtime. 12/23/20  Yes Particia Nearing, PA-C  nitrofurantoin, macrocrystal-monohydrate, (MACROBID) 100 MG capsule Take 1 capsule (100 mg total) by mouth 2 (two) times daily for 5 days. 03/16/23 03/21/23 Yes Ward, Tylene Fantasia, PA-C  albuterol (PROVENTIL HFA;VENTOLIN HFA) 108 (90 BASE) MCG/ACT inhaler Inhale 2 puffs into the lungs every 6 (six) hours as needed for wheezing (wheezing). Patient taking differently: Inhale 2 puffs into the lungs every 6 (six) hours as needed for wheezing. 09/09/14   Anyanwu, Jethro Bastos, MD  budesonide-formoterol (SYMBICORT) 160-4.5 MCG/ACT inhaler Inhale 2 puffs into the lungs 2 (two) times daily. Rinse mouth with water after each use 12/23/20   Particia Nearing, PA-C  enalapril (VASOTEC) 20 MG tablet Take 1 tablet (20 mg total) by mouth daily. 03/08/22   Anyanwu, Jethro Bastos, MD  ferrous sulfate 325 (65 FE) MG tablet Take 1 tablet (325 mg total) by mouth 2 (two) times daily with a meal. 03/05/15   Lynnae Prude, MD  fluticasone [facility]) 50 MCG/ACT nasal spray Place 1 spray into both nostrils in the morning and at bedtime. Patient taking differently: Place 1 spray into both nostrils 2 (two) times daily as needed for  allergies. 12/23/20   Particia Nearing, PA-C  ibuprofen (ADVIL,MOTRIN) 800 MG tablet Take 1 tablet (800 mg total) by mouth every 8 (eight) hours as needed for moderate pain. Patient taking differently: Take 800 mg by mouth 2 (two) times daily. 05/13/17   Ward, Chase Picket, PA-C    Family History Family History  Problem Relation Age of Onset   Diabetes Maternal Grandfather    Hypertension Maternal Grandfather    High blood pressure Mother    High blood pressure Other        mother's side    Seizures  Maternal Uncle     Social History Social History   Tobacco Use   Smoking status: Former    Current packs/day: 0.00    Average packs/day: 0.3 packs/day for 7.0 years (1.8 ttl pk-yrs)    Types: Cigarettes    Start date: 11/05/2007    Quit date: 11/05/2014    Years since quitting: 8.3   Smokeless tobacco: Never   Tobacco comments:    1 cigar/day  Vaping Use   Vaping status: Never Used  Substance Use Topics   Alcohol use: No    Alcohol/week: 0.0 standard drinks of alcohol   Drug use: Yes    Types: Marijuana     Allergies   Shrimp [shellfish allergy], Codeine, Dust mite extract, Mold extract [trichophyton], Penicillins, and Soap   Review of Systems Review of Systems  Constitutional:  Negative for chills and fever.  HENT:  Negative for ear pain and sore throat.   Eyes:  Negative for pain and visual disturbance.  Respiratory:  Negative for cough and shortness of breath.   Cardiovascular:  Negative for chest pain and palpitations.  Gastrointestinal:  Negative for abdominal pain and vomiting.  Genitourinary:  Positive for dysuria and vaginal discharge. Negative for hematuria.  Musculoskeletal:  Negative for arthralgias and back pain.  Skin:  Negative for color change and rash.  Neurological:  Negative for seizures and syncope.  All other systems reviewed and are negative.    Physical Exam Triage Vital Signs ED Triage Vitals [03/16/23 1553]  Encounter Vitals Group      BP (!) 196/152     Systolic BP Percentile      Diastolic BP Percentile      Pulse Rate 68     Resp 18     Temp 98.5 F (36.9 C)     Temp Source Oral     SpO2 95 %     Weight      Height      Head Circumference      Peak Flow      Pain Score      Pain Loc      Pain Education      Exclude from Growth Chart    No data found.  Updated Vital Signs BP (!) 196/152 (BP Location: Left Arm) Comment: Not taking medication for blood pressure.  Pulse 68   Temp 98.5 F (36.9 C) (Oral)   Resp 18   SpO2 95%   Visual Acuity Right Eye Distance:   Left Eye Distance:   Bilateral Distance:    Right Eye Near:   Left Eye Near:    Bilateral Near:     Physical Exam Vitals and nursing note reviewed.  Constitutional:      General: She is not in acute distress.    Appearance: She is well-developed.  HENT:     Head: Normocephalic and atraumatic.  Eyes:     Conjunctiva/sclera: Conjunctivae normal.  Cardiovascular:     Rate and Rhythm: Normal rate and regular rhythm.     Heart sounds: No murmur heard. Pulmonary:     Effort: Pulmonary effort is normal. No respiratory distress.     Breath sounds: Normal breath sounds.  Abdominal:     Palpations: Abdomen is soft.     Tenderness: There is no abdominal tenderness.  Musculoskeletal:        General: No swelling.     Cervical back: Neck supple.  Skin:    General: Skin is warm and dry.  Capillary Refill: Capillary refill takes less than 2 seconds.  Neurological:     Mental Status: She is alert.  Psychiatric:        Mood and Affect: Mood normal.      UC Treatments / Results  Labs (all labs ordered are listed, but only abnormal results are displayed) Labs Reviewed  POCT URINALYSIS DIP (MANUAL ENTRY) - Abnormal; Notable for the following components:      Result Value   Blood, UA moderate (*)    Protein Ur, POC =100 (*)    Nitrite, UA Positive (*)    All other components within normal limits  URINE CULTURE  POCT URINE  PREGNANCY  CERVICOVAGINAL ANCILLARY ONLY    EKG   Radiology No results found.  Procedures Procedures (including critical care time)  Medications Ordered in UC Medications - No data to display  Initial Impression / Assessment and Plan / UC Course  I have reviewed the triage vital signs and the nursing notes.  Pertinent labs & imaging results that were available during my care of the patient were reviewed by me and considered in my medical decision making (see chart for details).     Will treat for UTI and BV.  Cervicovaginal self swab in clinic today will call with test results and change treatment plan if indicated.  Urine culture also sent out.  Amlodipine started until patient can get back on her previous meds.  PCP assistance submitted.  Patient asymptomatic at this time. Strict ED precautions given.  Final Clinical Impressions(s) / UC Diagnoses   Final diagnoses:  Vaginosis  Primary hypertension     Discharge Instructions      Take antibiotic as prescribed Will call with test results and change treatment plan if needed    ED Prescriptions     Medication Sig Dispense Auth. Provider   metroNIDAZOLE (FLAGYL) 500 MG tablet Take 1 tablet (500 mg total) by mouth 2 (two) times daily. 14 tablet Ward, Shanda Bumps Z, PA-C   amLODipine (NORVASC) 5 MG tablet Take 1 tablet (5 mg total) by mouth daily. 30 tablet Ward, Tylene Fantasia, PA-C   nitrofurantoin, macrocrystal-monohydrate, (MACROBID) 100 MG capsule Take 1 capsule (100 mg total) by mouth 2 (two) times daily for 5 days. 10 capsule Ward, Tylene Fantasia, PA-C      PDMP not reviewed this encounter.   Ward, Tylene Fantasia, PA-C 03/16/23 [phone]

## 2023-03-16 NOTE — ED Triage Notes (Signed)
Pt states she has vaginal discharge with the odor x 2 weeks.

## 2023-03-17 LAB — URINE CULTURE: Culture: NO GROWTH

## 2023-03-18 LAB — CERVICOVAGINAL ANCILLARY ONLY
Bacterial Vaginitis (gardnerella): POSITIVE — AB
Candida Glabrata: NEGATIVE
Candida Vaginitis: NEGATIVE
Chlamydia: NEGATIVE
Comment: NEGATIVE
Comment: NEGATIVE
Comment: NEGATIVE
Comment: NEGATIVE
Comment: NEGATIVE
Comment: NORMAL
Neisseria Gonorrhea: NEGATIVE
Trichomonas: NEGATIVE

## 2023-05-14 ENCOUNTER — Encounter (HOSPITAL_COMMUNITY): Payer: Self-pay | Admitting: *Deleted

## 2023-05-14 ENCOUNTER — Ambulatory Visit (HOSPITAL_COMMUNITY)
Admission: EM | Admit: 2023-05-14 | Discharge: 2023-05-14 | Disposition: A | Discharge status: Home / Self Care | Payer: 59 | Attending: Internal Medicine | Admitting: Internal Medicine

## 2023-05-14 DIAGNOSIS — I160 Hypertensive urgency: Secondary | ICD-10-CM | POA: Insufficient documentation

## 2023-05-14 DIAGNOSIS — Z760 Encounter for issue of repeat prescription: Secondary | ICD-10-CM | POA: Diagnosis present

## 2023-05-14 LAB — BASIC METABOLIC PANEL
Anion gap: 11 (ref 5–15)
BUN: 12 mg/dL (ref 6–20)
CO2: 26 mmol/L (ref 22–32)
Calcium: 9.2 mg/dL (ref 8.9–10.3)
Chloride: 98 mmol/L (ref 98–111)
Creatinine, Ser: 0.54 mg/dL (ref 0.44–1.00)
GFR, Estimated: >60 mL/min (ref >60)
Glucose, Bld: 89 mg/dL (ref 70–99)
Potassium: 3.6 mmol/L (ref 3.5–5.1)
Sodium: 135 mmol/L (ref 135–145)

## 2023-05-14 LAB — CBC
HCT: 37 % (ref 36.0–46.0)
Hemoglobin: 10.7 g/dL — ABNORMAL LOW (ref 12.0–15.0)
MCH: 22.4 pg — ABNORMAL LOW (ref 26.0–34.0)
MCHC: 28.9 g/dL — ABNORMAL LOW (ref 30.0–36.0)
MCV: 77.6 fL — ABNORMAL LOW (ref 80.0–100.0)
Platelets: 352 10*3/uL (ref 150–400)
RBC: 4.77 MIL/uL (ref 3.87–5.11)
RDW: 16.4 % — ABNORMAL HIGH (ref 11.5–15.5)
WBC: 4.1 10*3/uL (ref 4.0–10.5)
nRBC: 0 % (ref 0.0–0.2)

## 2023-05-14 MED ORDER — AMLODIPINE BESYLATE 5 MG PO TABS: 5.0000 mg | ORAL_TABLET | Freq: Every day | ORAL | 1 refills | Status: DC

## 2023-05-14 MED ORDER — BLOOD PRESSURE CUFF MISC: 0 refills | Status: DC

## 2023-05-14 NOTE — ED Triage Notes (Signed)
Pt states she needs a refill of her BP meds. She has been out x 3 days.

## 2023-05-14 NOTE — ED Provider Notes (Signed)
[facility]    CSN: [id number] Arrival date & time: 05/14/23  1151      History   Chief Complaint Chief Complaint  Patient presents with   Medication Refill    HPI Dana Parks is a 37 y.o. female.   Patient presents to urgent care with her stepmother who contributes to the history for evaluation of elevated blood pressure reading and medication refill.  She was prescribed amlodipine at visit on March 16, 2023 (2 months ago).  She was provided 2 refills of medication at that visit but states she has run out of her medicine.  Stepmother questions whether or not she has been taking it appropriately.  Patient states she has been taking medication daily without missed doses.  Patient lives with her stepmother and has a history of cerebral palsy.  She is without chest pain, shortness of breath, leg swelling, orthopnea, rash, vision changes, headache, dizziness, or paresthesias/extremity weakness.  She has a primary care provider but is unable to get in with them for an appointment for the next 2 months.  She tried "edibles" and clarifies that this had marijuana in it.  She denies any other drug use including methamphetamines, cocaine, and cigarette smoking.  Denies history of cardiac/neurovascular problems.  Blood pressure extremely elevated in clinic at 211/139.  Her baseline is around the 190s over 120s per chart review.   Medication Refill   Past Medical History:  Diagnosis Date   Allergic rhinitis    Anemia    of other chronic disease   Anxiety    Asthma    Back pain    unknown    Cerebral palsy (HCC)    Headache(784.0)    frequently   Hypertension    Incontinence of urine    Liver disease    Denies 09/10/2018   Low sodium diet    Lumbago 05/21/2014   pain in lower back    Muscle spasm    takes Flexeril daily   Neuromuscular disorder (HCC)    cerebral palsy   Numbness    bilateral feet   Peripheral edema    occasionally   PONV (postoperative nausea and  vomiting)    Rhegmatogenous retinal detachment of both eyes    Seizure disorder (HCC)    Seizures (HCC)    last one in high school;takes Depakote daily   Wears glasses     Patient Active Problem List   Diagnosis Date Noted   Intractable migraine with aura with status migrainosus 01/08/2017   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   Asthma, mild intermittent, well-controlled 12/13/2014   Low lying placenta without hemorrhage, antepartum    Epilepsy affecting pregnancy, 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   Asthma complicating pregnancy, antepartum 09/09/2014   Cerebral palsy (HCC)    Seizure disorder (HCC)     Past Surgical History:  Procedure Laterality Date   CESAREAN SECTION  2007   CESAREAN SECTION WITH BILATERAL TUBAL LIGATION Bilateral 03/03/2015   Procedure: CESAREAN SECTION WITH BILATERAL TUBAL LIGATION;  Surgeon: Willodean Rosenthal, MD;  Location: [facility];  Service: Obstetrics;  Laterality: Bilateral;  Requested 03/03/15 @ 3:30p  Ok per Cleo/Myra-TM   GAS/FLUID EXCHANGE Right 09/11/2018   Procedure: Gas/Fluid Exchange RIGHT EYE;  Surgeon: Rennis Chris, MD;  Location: [facility] OR;  Service: Ophthalmology;  Laterality: Right;  [id number]   LASER PHOTO ABLATION Left 09/11/2018   Procedure: LASER PHOTO ABLATION LEFT EYE;  Surgeon: Rennis Chris, MD;  Location: [facility] OR;  Service: Ophthalmology;  Laterality: Left;  LEG SURGERY Bilateral    PARS PLANA VITRECTOMY Right 09/11/2018   Procedure: PARS PLANA VITRECTOMY WITH 25 GAUGE RIGHT EYE;  Surgeon: Rennis Chris, MD;  Location: [facility] OR;  Service: Ophthalmology;  Laterality: Right;   PERFLUORONE INJECTION Right 09/11/2018   Procedure: Perfluorone Injection RIGHT EYE;  Surgeon: Rennis Chris, MD;  Location: [facility] OR;  Service: Ophthalmology;  Laterality: Right;   PHOTOCOAGULATION WITH LASER Right 09/11/2018   Procedure: Photocoagulation With Laser RIGHT EYE;  Surgeon: Rennis Chris, MD;  Location: [facility] OR;  Service: Ophthalmology;  Laterality: Right;   SCLERAL BUCKLE Right  09/11/2018   Procedure: Scleral Buckle RIGHT EYE;  Surgeon: Rennis Chris, MD;  Location: [facility] OR;  Service: Ophthalmology;  Laterality: Right;   TOOTH EXTRACTION N/A 02/16/2013   Procedure: EXTRACTION 16, 17, 32;  Surgeon: Georgia Lopes, DDS;  Location: [facility] OR;  Service: Oral Surgery;  Laterality: N/A;    OB History     Gravida  2   Para  2   Term  2   Preterm  0   AB  0   Living  2      SAB  0   IAB  0   Ectopic  0   Multiple  0   Live Births  2            Home Medications    Prior to Admission medications   Medication Sig Start Date End Date Taking? Authorizing Provider  amLODipine (NORVASC) 5 MG tablet Take 1 tablet (5 mg total) by mouth daily. 05/14/23  Yes Carlisle Beers, FNP  Blood Pressure Monitoring (BLOOD PRESSURE CUFF) MISC Take blood pressure 3-4 times weekly, write down numbers in a log. 05/14/23  Yes Carlisle Beers, FNP  levETIRAcetam (KEPPRA) 1000 MG tablet Take 1 tablet (1,000 mg total) by mouth 2 (two) times daily. 10/01/21  Yes Milagros Loll, MD  levETIRAcetam (KEPPRA) 500 MG tablet Take 500 mg by mouth 2 (two) times daily. 10/21/20  Yes [provider]  albuterol (PROVENTIL HFA;VENTOLIN HFA) 108 (90 BASE) MCG/ACT inhaler Inhale 2 puffs into the lungs every 6 (six) hours as needed for wheezing (wheezing). Patient taking differently: Inhale 2 puffs into the lungs every 6 (six) hours as needed for wheezing. 09/09/14   Anyanwu, Jethro Bastos, MD  budesonide-formoterol (SYMBICORT) 160-4.5 MCG/ACT inhaler Inhale 2 puffs into the lungs 2 (two) times daily. Rinse mouth with water after each use 12/23/20   Particia Nearing, PA-C  enalapril (VASOTEC) 20 MG tablet Take 1 tablet (20 mg total) by mouth daily. 03/08/22   Anyanwu, Jethro Bastos, MD  ferrous sulfate 325 (65 FE) MG tablet Take 1 tablet (325 mg total) by mouth 2 (two) times daily with a meal. 03/05/15   Lynnae Prude, MD  fluticasone [facility]) 50 MCG/ACT nasal spray Place 1 spray  into both nostrils in the morning and at bedtime. Patient taking differently: Place 1 spray into both nostrils 2 (two) times daily as needed for allergies. 12/23/20   Particia Nearing, PA-C  ibuprofen (ADVIL,MOTRIN) 800 MG tablet Take 1 tablet (800 mg total) by mouth every 8 (eight) hours as needed for moderate pain. Patient taking differently: Take 800 mg by mouth 2 (two) times daily. 05/13/17   Ward, Chase Picket, PA-C  loratadine (CLARITIN) 10 MG tablet Take 1 tablet (10 mg total) by mouth daily. 12/23/20   Particia Nearing, PA-C  metroNIDAZOLE (FLAGYL) 500 MG tablet Take 1 tablet (500 mg total) by mouth 2 (  two) times daily. 03/16/23   Ward, Tylene Fantasia, PA-C  montelukast (SINGULAIR) 10 MG tablet Take 1 tablet (10 mg total) by mouth at bedtime. 12/23/20   Particia Nearing, PA-C    Family History Family History  Problem Relation Age of Onset   Diabetes Maternal Grandfather    Hypertension Maternal Grandfather    High blood pressure Mother    High blood pressure Other        mother's side    Seizures Maternal Uncle     Social History Social History   Tobacco Use   Smoking status: Former    Current packs/day: 0.00    Average packs/day: 0.3 packs/day for 7.0 years (1.8 ttl pk-yrs)    Types: Cigarettes    Start date: 11/05/2007    Quit date: 11/05/2014    Years since quitting: 8.5   Smokeless tobacco: Never   Tobacco comments:    1 cigar/day  Vaping Use   Vaping status: Never Used  Substance Use Topics   Alcohol use: No    Alcohol/week: 0.0 standard drinks of alcohol   Drug use: Yes    Types: Marijuana     Allergies   Shrimp [shellfish allergy], Codeine, Dust mite extract, Mold extract [trichophyton], Penicillins, and Soap   Review of Systems Review of Systems Per HPI  Physical Exam Triage Vital Signs ED Triage Vitals  Encounter Vitals Group     BP 05/14/23 1302 (!) 211/139     Systolic BP Percentile --      Diastolic BP Percentile --      Pulse Rate  05/14/23 1302 (!) 104     Resp 05/14/23 1302 18     Temp 05/14/23 1302 97.8 F (36.6 C)     Temp Source 05/14/23 1302 Oral     SpO2 05/14/23 1302 97 %     Weight --      Height --      Head Circumference --      Peak Flow --      Pain Score 05/14/23 1259 0     Pain Loc --      Pain Education --      Exclude from Growth Chart --    No data found.  Updated Vital Signs BP (!) 182/116 (BP Location: Left Arm)   Pulse (!) 104   Temp 97.8 F (36.6 C) (Oral)   Resp 18   LMP 05/07/2023 (Approximate)   SpO2 97%   Visual Acuity Right Eye Distance:   Left Eye Distance:   Bilateral Distance:    Right Eye Near:   Left Eye Near:    Bilateral Near:     Physical Exam Vitals and nursing note reviewed.  Constitutional:      Appearance: She is not ill-appearing or toxic-appearing.  HENT:     Head: Normocephalic and atraumatic.     Right Ear: Hearing and external ear normal.     Left Ear: Hearing and external ear normal.     Nose: Nose normal.     Mouth/Throat:     Lips: Pink.     Mouth: Mucous membranes are moist. No injury.     Tongue: No lesions. Tongue does not deviate from midline.     Palate: No mass and lesions.     Pharynx: Oropharynx is clear. Uvula midline. No pharyngeal swelling, oropharyngeal exudate, posterior oropharyngeal erythema or uvula swelling.     Tonsils: No tonsillar exudate or tonsillar abscesses.  Eyes:  General: Lids are normal. Vision grossly intact. Gaze aligned appropriately. No visual field deficit.    Extraocular Movements: Extraocular movements intact.     Conjunctiva/sclera: Conjunctivae normal.  Cardiovascular:     Rate and Rhythm: Normal rate and regular rhythm.     Heart sounds: Normal heart sounds, S1 normal and S2 normal.  Pulmonary:     Effort: Pulmonary effort is normal. No respiratory distress.     Breath sounds: Normal breath sounds and air entry.  Musculoskeletal:     Cervical back: Neck supple.  Skin:    General: Skin is warm  and dry.     Capillary Refill: Capillary refill takes less than 2 seconds.     Findings: No rash.  Neurological:     General: No focal deficit present.     Mental Status: She is alert and oriented to person, place, and time. Mental status is at baseline.     Cranial Nerves: Cranial nerves 2-12 are intact. No dysarthria or facial asymmetry.     Sensory: Sensation is intact.     Motor: Motor function is intact.     Coordination: Coordination is intact.     Gait: Gait is intact.     Comments: Strength and sensation intact to bilateral upper and lower extremities (5/5). Moves all 4 extremities with normal coordination voluntarily. Non-focal neuro exam.   Psychiatric:        Mood and Affect: Mood normal.        Speech: Speech normal.        Behavior: Behavior normal.        Thought Content: Thought content normal.        Judgment: Judgment normal.      UC Treatments / Results  Labs (all labs ordered are listed, but only abnormal results are displayed) Labs Reviewed  CBC  BASIC METABOLIC PANEL    EKG   Radiology No results found.  Procedures Procedures (including critical care time)  Medications Ordered in UC Medications - No data to display  Initial Impression / Assessment and Plan / UC Course  I have reviewed the triage vital signs and the nursing notes.  Pertinent labs & imaging results that were available during my care of the patient were reviewed by me and considered in my medical decision making (see chart for details).   1. Hypertensive urgency, medication refill Blood pressure significantly elevated in clinic at 211/139, on recheck 182/116.  Recheck reading is more consistent with patient's baseline.  BP Readings from Last 3 Encounters:  05/14/23 (!) 182/116  03/16/23 (!) 196/152  03/08/22 (!) 161/107   EKG shows normal sinus rhythm with normal intervals and without ST/T wave changes. Previous EKG from 09/2021 show similar findings.   Since she is  asymptomatic and well appearing with hemodynamically stable vital signs.  No signs of end organ damage.  CBC and BMP pending to evaluate for renal function/electrolyte imbalance.  PCP follow-up for ongoing evaluation and management of HTN.  Counseled patient on potential for adverse effects with medications prescribed/recommended today, strict ER and return-to-clinic precautions discussed, patient verbalized understanding.   Final Clinical Impressions(s) / UC Diagnoses   Final diagnoses:  Hypertensive urgency  Medication refill     Discharge Instructions      Your blood pressure was elevated in the clinic today. We need to bring your blood pressure down safely and slowly to a normal level with medicine. Take blood pressure medication once daily in the morning.  Purchase a blood  pressure cuff and take your BP once daily for the next 2 weeks, then 3-4 times per week after that. Write these numbers down in a notebook and bring the notebook with you to your primary care doctor appointment.  I expect your numbers to remain elevated and slowly come down to "normal".   Lower the amount of salt in your diet to less than 1 gram of salt per day and increase exercise to naturally lower BP. Review information provided regarding high blood pressure.  Schedule an appointment with primary care provider for ongoing management of high blood pressure and for routine healthcare screenings.  Please go to the ER if you develop any severe symptoms such as chest pain, sudden shortness of breath, or one-sided weakness. I hope you feel better!      ED Prescriptions     Medication Sig Dispense Auth. Provider   Blood Pressure Monitoring (BLOOD PRESSURE CUFF) MISC Take blood pressure 3-4 times weekly, write down numbers in a log. 1 each Carlisle Beers, FNP   amLODipine (NORVASC) 5 MG tablet Take 1 tablet (5 mg total) by mouth daily. 30 tablet Carlisle Beers, FNP      PDMP not reviewed this  encounter.   Carlisle Beers, [place] 05/14/23 1359

## 2023-05-14 NOTE — Discharge Instructions (Addendum)
Your blood pressure was elevated in the clinic today. We need to bring your blood pressure down safely and slowly to a normal level with medicine. Take blood pressure medication once daily in the morning.  Purchase a blood pressure cuff and take your BP once daily for the next 2 weeks, then 3-4 times per week after that. Write these numbers down in a notebook and bring the notebook with you to your primary care doctor appointment.  I expect your numbers to remain elevated and slowly come down to "normal".   Lower the amount of salt in your diet to less than 1 gram of salt per day and increase exercise to naturally lower BP. Review information provided regarding high blood pressure.  Schedule an appointment with primary care provider for ongoing management of high blood pressure and for routine healthcare screenings.  Please go to the ER if you develop any severe symptoms such as chest pain, sudden shortness of breath, or one-sided weakness. I hope you feel better!

## 2023-05-20 ENCOUNTER — Telehealth (HOSPITAL_COMMUNITY): Payer: Self-pay | Admitting: Emergency Medicine

## 2023-06-07 ENCOUNTER — Encounter: Payer: Self-pay | Admitting: Podiatry

## 2023-06-07 ENCOUNTER — Ambulatory Visit (INDEPENDENT_AMBULATORY_CARE_PROVIDER_SITE_OTHER): Payer: 59 | Admitting: Podiatry

## 2023-06-07 DIAGNOSIS — M79675 Pain in left toe(s): Secondary | ICD-10-CM

## 2023-06-07 DIAGNOSIS — B351 Tinea unguium: Secondary | ICD-10-CM | POA: Insufficient documentation

## 2023-06-07 DIAGNOSIS — M79674 Pain in right toe(s): Secondary | ICD-10-CM

## 2023-06-07 NOTE — Progress Notes (Signed)

## 2023-06-21 ENCOUNTER — Ambulatory Visit (HOSPITAL_COMMUNITY)
Admission: EM | Admit: 2023-06-21 | Discharge: 2023-06-21 | Disposition: A | Discharge status: Home / Self Care | Payer: 59 | Attending: Emergency Medicine | Admitting: Emergency Medicine

## 2023-06-21 ENCOUNTER — Encounter (HOSPITAL_COMMUNITY): Payer: Self-pay | Admitting: Emergency Medicine

## 2023-06-21 DIAGNOSIS — I160 Hypertensive urgency: Secondary | ICD-10-CM | POA: Diagnosis not present

## 2023-06-21 DIAGNOSIS — M79641 Pain in right hand: Secondary | ICD-10-CM

## 2023-06-21 DIAGNOSIS — M79642 Pain in left hand: Secondary | ICD-10-CM

## 2023-06-21 MED ORDER — AMLODIPINE BESYLATE 5 MG PO TABS: 5.0000 mg | ORAL_TABLET | Freq: Every day | ORAL | 0 refills | Status: DC
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
## 2023-06-21 NOTE — Discharge Instructions (Signed)
I believe most of your issues are coming from your elevated blood pressure.  Sometimes cerebral palsy can lead to arthritis and cause hand swelling as well.  Please take the 5 mg of amlodipine every night prior to going to bed.  Please take 500 mg of Tylenol every 8 hours, you can alternate with ibuprofen for the pain and swelling in your hands.  Do not hesitate to seek immediate care at the nearest emergency department if you develop loss of consciousness, vision loss, extreme sudden headache, or any new concerning symptoms.  As Urgent Care providers, we only only can evaluate you for an episodic event; and this cannot be substituted for the continued care and monitoring by your primary care provider and/or specialist. It is not unusual that a medical condition can present itself in one way, then progress or change and lead to another impression of your medical condition. If you should have any new or worsening symptoms, please go to the closest emergency department and contact your primary physician as soon as possible for further evaluation and testing.

## 2023-07-11 ENCOUNTER — Emergency Department (HOSPITAL_COMMUNITY)
Admission: EM | Admit: 2023-07-11 | Discharge: 2023-07-11 | Discharge status: Against Medical Advice | Payer: 59 | Source: Home / Self Care

## 2023-07-11 ENCOUNTER — Encounter (HOSPITAL_COMMUNITY): Payer: Self-pay | Admitting: Emergency Medicine

## 2023-07-11 ENCOUNTER — Emergency Department (HOSPITAL_COMMUNITY): Payer: 59

## 2023-07-11 ENCOUNTER — Inpatient Hospital Stay (HOSPITAL_COMMUNITY)
Admission: EM | Admit: 2023-07-11 | Discharge: 2023-07-18 | DRG: 291 | Disposition: A | Discharge status: Home Health | Payer: 59 | Attending: Family Medicine | Admitting: Family Medicine

## 2023-07-11 DIAGNOSIS — G43909 Migraine, unspecified, not intractable, without status migrainosus: Secondary | ICD-10-CM | POA: Diagnosis present

## 2023-07-11 DIAGNOSIS — Z885 Allergy status to narcotic agent status: Secondary | ICD-10-CM

## 2023-07-11 DIAGNOSIS — J45909 Unspecified asthma, uncomplicated: Secondary | ICD-10-CM | POA: Diagnosis present

## 2023-07-11 DIAGNOSIS — Z833 Family history of diabetes mellitus: Secondary | ICD-10-CM

## 2023-07-11 DIAGNOSIS — Z880 Allergy status to penicillin: Secondary | ICD-10-CM

## 2023-07-11 DIAGNOSIS — G8191 Hemiplegia, unspecified affecting right dominant side: Secondary | ICD-10-CM | POA: Diagnosis present

## 2023-07-11 DIAGNOSIS — D509 Iron deficiency anemia, unspecified: Secondary | ICD-10-CM | POA: Diagnosis present

## 2023-07-11 DIAGNOSIS — K068 Other specified disorders of gingiva and edentulous alveolar ridge: Secondary | ICD-10-CM | POA: Diagnosis not present

## 2023-07-11 DIAGNOSIS — I5033 Acute on chronic diastolic (congestive) heart failure: Secondary | ICD-10-CM | POA: Diagnosis present

## 2023-07-11 DIAGNOSIS — Z9109 Other allergy status, other than to drugs and biological substances: Secondary | ICD-10-CM

## 2023-07-11 DIAGNOSIS — Z87892 Personal history of anaphylaxis: Secondary | ICD-10-CM

## 2023-07-11 DIAGNOSIS — Z5321 Procedure and treatment not carried out due to patient leaving prior to being seen by health care provider: Secondary | ICD-10-CM | POA: Insufficient documentation

## 2023-07-11 DIAGNOSIS — I509 Heart failure, unspecified: Secondary | ICD-10-CM

## 2023-07-11 DIAGNOSIS — G40909 Epilepsy, unspecified, not intractable, without status epilepticus: Secondary | ICD-10-CM | POA: Diagnosis present

## 2023-07-11 DIAGNOSIS — T465X6A Underdosing of other antihypertensive drugs, initial encounter: Secondary | ICD-10-CM | POA: Diagnosis present

## 2023-07-11 DIAGNOSIS — E871 Hypo-osmolality and hyponatremia: Secondary | ICD-10-CM | POA: Diagnosis present

## 2023-07-11 DIAGNOSIS — I160 Hypertensive urgency: Secondary | ICD-10-CM | POA: Diagnosis not present

## 2023-07-11 DIAGNOSIS — G809 Cerebral palsy, unspecified: Secondary | ICD-10-CM | POA: Diagnosis present

## 2023-07-11 DIAGNOSIS — I110 Hypertensive heart disease with heart failure: Principal | ICD-10-CM | POA: Diagnosis present

## 2023-07-11 DIAGNOSIS — E876 Hypokalemia: Secondary | ICD-10-CM | POA: Diagnosis present

## 2023-07-11 DIAGNOSIS — R079 Chest pain, unspecified: Secondary | ICD-10-CM | POA: Insufficient documentation

## 2023-07-11 DIAGNOSIS — I2489 Other forms of acute ischemic heart disease: Secondary | ICD-10-CM | POA: Diagnosis present

## 2023-07-11 DIAGNOSIS — I161 Hypertensive emergency: Principal | ICD-10-CM | POA: Diagnosis present

## 2023-07-11 DIAGNOSIS — Z7951 Long term (current) use of inhaled steroids: Secondary | ICD-10-CM

## 2023-07-11 DIAGNOSIS — T501X5A Adverse effect of loop [high-ceiling] diuretics, initial encounter: Secondary | ICD-10-CM | POA: Diagnosis not present

## 2023-07-11 DIAGNOSIS — N179 Acute kidney failure, unspecified: Secondary | ICD-10-CM | POA: Diagnosis not present

## 2023-07-11 DIAGNOSIS — Z79899 Other long term (current) drug therapy: Secondary | ICD-10-CM

## 2023-07-11 DIAGNOSIS — Z8249 Family history of ischemic heart disease and other diseases of the circulatory system: Secondary | ICD-10-CM

## 2023-07-11 DIAGNOSIS — M25512 Pain in left shoulder: Secondary | ICD-10-CM | POA: Diagnosis present

## 2023-07-11 DIAGNOSIS — Z91013 Allergy to seafood: Secondary | ICD-10-CM

## 2023-07-11 DIAGNOSIS — Z87891 Personal history of nicotine dependence: Secondary | ICD-10-CM

## 2023-07-11 DIAGNOSIS — R7401 Elevation of levels of liver transaminase levels: Secondary | ICD-10-CM | POA: Diagnosis present

## 2023-07-11 DIAGNOSIS — Z91148 Patient's other noncompliance with medication regimen for other reason: Secondary | ICD-10-CM

## 2023-07-11 DIAGNOSIS — G8929 Other chronic pain: Secondary | ICD-10-CM | POA: Diagnosis present

## 2023-07-11 LAB — CBC
HCT: 30.1 % — ABNORMAL LOW (ref 36.0–46.0)
Hemoglobin: 8.5 g/dL — ABNORMAL LOW (ref 12.0–15.0)
MCH: 21.3 pg — ABNORMAL LOW (ref 26.0–34.0)
MCHC: 28.2 g/dL — ABNORMAL LOW (ref 30.0–36.0)
MCV: 75.3 fL — ABNORMAL LOW (ref 80.0–100.0)
Platelets: 296 10*3/uL (ref 150–400)
RBC: 4 MIL/uL (ref 3.87–5.11)
RDW: 18.6 % — ABNORMAL HIGH (ref 11.5–15.5)
WBC: 3.7 10*3/uL — ABNORMAL LOW (ref 4.0–10.5)
nRBC: 0 % (ref 0.0–0.2)

## 2023-07-11 LAB — BASIC METABOLIC PANEL
Anion gap: 12 (ref 5–15)
BUN: 12 mg/dL (ref 6–20)
CO2: 23 mmol/L (ref 22–32)
Calcium: 9 mg/dL (ref 8.9–10.3)
Chloride: 97 mmol/L — ABNORMAL LOW (ref 98–111)
Creatinine, Ser: 0.72 mg/dL (ref 0.44–1.00)
GFR, Estimated: >60 mL/min (ref >60)
Glucose, Bld: 88 mg/dL (ref 70–99)
Potassium: 3 mmol/L — ABNORMAL LOW (ref 3.5–5.1)
Sodium: 132 mmol/L — ABNORMAL LOW (ref 135–145)

## 2023-07-11 LAB — TROPONIN I (HIGH SENSITIVITY): Troponin I (High Sensitivity): 251 ng/L (ref <18)

## 2023-07-11 LAB — HCG, SERUM, QUALITATIVE: Preg, Serum: NEGATIVE

## 2023-07-11 MED ORDER — LABETALOL HCL 5 MG/ML IV SOLN
20.0000 mg | Freq: Once | INTRAVENOUS | Status: AC
Start: 2023-07-11 — End: 2023-07-11
  Administered 2023-07-11: 20 mg via INTRAVENOUS
  Filled 2023-07-11: qty 4

## 2023-07-11 MED ORDER — HYDRALAZINE HCL 25 MG PO TABS
25.0000 mg | ORAL_TABLET | Freq: Once | ORAL | Status: AC
  Administered 2023-07-11: 25 mg via ORAL
  Filled 2023-07-11: qty 1

## 2023-07-11 MED ORDER — HYDRALAZINE HCL 20 MG/ML IJ SOLN
10.0000 mg | Freq: Once | INTRAMUSCULAR | Status: AC
Start: 2023-07-11 — End: 2023-07-11
  Administered 2023-07-11: 10 mg via INTRAVENOUS
  Filled 2023-07-11: qty 1

## 2023-07-11 MED ORDER — METOCLOPRAMIDE HCL 5 MG/ML IJ SOLN
10.0000 mg | Freq: Once | INTRAMUSCULAR | Status: AC
Start: 2023-07-11 — End: 2023-07-11
  Administered 2023-07-11: 10 mg via INTRAVENOUS
  Filled 2023-07-11: qty 2

## 2023-07-11 NOTE — ED Provider Triage Note (Signed)
Emergency Medicine Provider Triage Evaluation Note  Dana Parks , a 37 y.o. female  was evaluated in triage.  Pt complains of chest pain, headache, and high blood pressure.  Patient has been taking her blood pressure medication as prescribed.  Review of Systems  Positive:  Negative: See above   Physical Exam  BP (!) 221/139 (BP Location: Right Arm)   Pulse (!) 111   Temp 98.6 F (37 C)   Resp 18   LMP 06/20/2023   SpO2 98%  Gen:   Awake, no distress   Resp:  Normal effort  MSK:   Moves extremities without difficulty  Other:    Medical Decision Making  Medically screening exam initiated at 8:51 PM.  Appropriate orders placed.  Dana Parks was informed that the remainder of the evaluation will be completed by another provider, this initial triage assessment does not replace that evaluation, and the importance of remaining in the ED until their evaluation is complete.     Honor Loh [place], [place] 07/11/23 2054

## 2023-07-11 NOTE — ED Triage Notes (Signed)
Patient c/o 10/10 chest pain, palpitations, and dizziness starting this morning, and hand pain and changing colors. Patient does not appear in distress at this time.

## 2023-07-11 NOTE — ED Provider Notes (Signed)
[place] EMERGENCY DEPARTMENT AT [facility] Provider Note   CSN: [id number] Arrival date & time: 07/11/23  2007     History  Chief Complaint  Patient presents with   Chest Pain   Shortness of Breath   Dizziness   Palpitations    Dana Parks is a 37 y.o. female.  Patient with a history of cerebral palsy with right-sided weakness, seizure disorder, hypertension, anemia presenting with chest pain to the right chest that has been constant all day and radiates to her upper back.  She reports pain to her right upper chest, right upper back and right arm that is constant worse with palpation and movement.  Associate with some shortness of breath and dizziness.  She notes her blood pressure is elevated but states compliance with her blood pressure medications which includes amlodipine and Vasotec.  Denies any missed doses of medications.  Does have some shortness of breath, nausea, lightheadedness and dizziness.  Endorses gradual onset headache with worsening of her right sided weakness.  Denies sudden onset thunderclap headache.  Denies fever.  Some increased weakness to her right arm compared to baseline.  Denies any new difficulty speaking or difficulty swallowing.  Denies any vomiting.  Denies any blurry vision or double vision.  No fever. Denies any cough.  The history is provided by the patient.  Chest Pain Associated symptoms: dizziness, headache, nausea, palpitations, shortness of breath and weakness   Associated symptoms: no abdominal pain, no fever and no vomiting   Shortness of Breath Associated symptoms: chest pain and headaches   Associated symptoms: no abdominal pain, no fever, no rash and no vomiting   Dizziness Associated symptoms: chest pain, headaches, nausea, palpitations, shortness of breath and weakness   Associated symptoms: no vomiting   Palpitations Associated symptoms: chest pain, dizziness, nausea, shortness of breath and weakness   Associated  symptoms: no vomiting        Home Medications Prior to Admission medications   Medication Sig Start Date End Date Taking? Authorizing Provider  albuterol (PROVENTIL HFA;VENTOLIN HFA) 108 (90 BASE) MCG/ACT inhaler Inhale 2 puffs into the lungs every 6 (six) hours as needed for wheezing (wheezing). Patient taking differently: Inhale 2 puffs into the lungs every 6 (six) hours as needed for wheezing. 09/09/14   Anyanwu, Jethro Bastos, MD  amLODipine (NORVASC) 5 MG tablet Take 1 tablet (5 mg total) by mouth at bedtime. 06/21/23   Garrison, [place] N, FNP  Blood Pressure Monitoring (BLOOD PRESSURE CUFF) MISC Take blood pressure 3-4 times weekly, write down numbers in a log. 05/14/23   Carlisle Beers, FNP  budesonide-formoterol (SYMBICORT) 160-4.5 MCG/ACT inhaler Inhale 2 puffs into the lungs 2 (two) times daily. Rinse mouth with water after each use 12/23/20   Particia Nearing, PA-C  enalapril (VASOTEC) 20 MG tablet Take 1 tablet (20 mg total) by mouth daily. 03/08/22   Anyanwu, Jethro Bastos, MD  ferrous sulfate 325 (65 FE) MG tablet Take 1 tablet (325 mg total) by mouth 2 (two) times daily with a meal. 03/05/15   Lynnae Prude, MD  fluticasone [facility]) 50 MCG/ACT nasal spray Place 1 spray into both nostrils in the morning and at bedtime. Patient taking differently: Place 1 spray into both nostrils 2 (two) times daily as needed for allergies. 12/23/20   Particia Nearing, PA-C  ibuprofen (ADVIL,MOTRIN) 800 MG tablet Take 1 tablet (800 mg total) by mouth every 8 (eight) hours as needed for moderate pain. Patient taking differently: Take  800 mg by mouth 2 (two) times daily. 05/13/17   Ward, Chase Picket, PA-C  levETIRAcetam (KEPPRA) 1000 MG tablet Take 1 tablet (1,000 mg total) by mouth 2 (two) times daily. 10/01/21   Milagros Loll, MD  levETIRAcetam (KEPPRA) 500 MG tablet Take 500 mg by mouth 2 (two) times daily. 10/21/20   [provider]  loratadine (CLARITIN) 10 MG tablet Take 1  tablet (10 mg total) by mouth daily. 12/23/20   Particia Nearing, PA-C  metroNIDAZOLE (FLAGYL) 500 MG tablet Take 1 tablet (500 mg total) by mouth 2 (two) times daily. 03/16/23   Ward, Tylene Fantasia, PA-C  montelukast (SINGULAIR) 10 MG tablet Take 1 tablet (10 mg total) by mouth at bedtime. 12/23/20   Particia Nearing, PA-C      Allergies    Shrimp [shellfish allergy], Codeine, Dust mite extract, Mold extract [trichophyton], Penicillins, and Soap    Review of Systems   Review of Systems  Constitutional:  Negative for activity change, appetite change and fever.  HENT:  Negative for congestion and rhinorrhea.   Respiratory:  Positive for chest tightness and shortness of breath.   Cardiovascular:  Positive for chest pain and palpitations.  Gastrointestinal:  Positive for nausea. Negative for abdominal pain and vomiting.  Genitourinary:  Negative for dysuria and hematuria.  Musculoskeletal:  Negative for arthralgias.  Skin:  Negative for rash.  Neurological:  Positive for dizziness, weakness and headaches.   all other systems are negative except as noted in the HPI and PMH.    Physical Exam Updated Vital Signs BP (!) 216/144   Pulse (!) 120   Temp 98.2 F (36.8 C) (Oral)   Resp (!) 24   Ht 4\' 11"  (1.499 m)   Wt 70.3 kg   LMP 06/20/2023   SpO2 100%   BMI 31.31 kg/m  Physical Exam Vitals and nursing note reviewed.  Constitutional:      General: She is not in acute distress.    Appearance: She is well-developed.  HENT:     Head: Normocephalic and atraumatic.     Mouth/Throat:     Pharynx: No oropharyngeal exudate.  Eyes:     Conjunctiva/sclera: Conjunctivae normal.     Pupils: Pupils are equal, round, and reactive to light.  Neck:     Comments: No meningismus. Cardiovascular:     Rate and Rhythm: Regular rhythm. Tachycardia present.     Heart sounds: Normal heart sounds. No murmur heard. Pulmonary:     Effort: Pulmonary effort is normal. No respiratory distress.      Breath sounds: Normal breath sounds.  Chest:     Chest wall: Tenderness present.  Abdominal:     Palpations: Abdomen is soft.     Tenderness: There is no abdominal tenderness. There is no guarding or rebound.  Musculoskeletal:        General: No tenderness. Normal range of motion.     Cervical back: Normal range of motion and neck supple.  Skin:    General: Skin is warm.  Neurological:     Mental Status: She is alert and oriented to person, place, and time.     Cranial Nerves: No cranial nerve deficit.     Motor: No abnormal muscle tone.     Coordination: Coordination normal.     Comments: Right-sided weakness at baseline.  Left side strength 5/5 throughout Dysarthria at baseline  Psychiatric:        Behavior: Behavior normal.     ED Results /  Procedures / Treatments   Labs (all labs ordered are listed, but only abnormal results are displayed) Labs Reviewed  BASIC METABOLIC PANEL - Abnormal; Notable for the following components:      Result Value   Sodium 132 (*)    Potassium 3.0 (*)    Chloride 97 (*)    All other components within normal limits  CBC - Abnormal; Notable for the following components:   WBC 3.7 (*)    Hemoglobin 8.5 (*)    HCT 30.1 (*)    MCV 75.3 (*)    MCH 21.3 (*)    MCHC 28.2 (*)    RDW 18.6 (*)    All other components within normal limits  HEPATIC FUNCTION PANEL - Abnormal; Notable for the following components:   Total Protein 9.7 (*)    Albumin 3.0 (*)    AST 202 (*)    ALT 108 (*)    All other components within normal limits  TROPONIN I (HIGH SENSITIVITY) - Abnormal; Notable for the following components:   Troponin I (High Sensitivity) 251 (*)    All other components within normal limits  TROPONIN I (HIGH SENSITIVITY) - Abnormal; Notable for the following components:   Troponin I (High Sensitivity) 225 (*)    All other components within normal limits  HCG, SERUM, QUALITATIVE  LIPASE, BLOOD  HIV ANTIBODY (ROUTINE TESTING W REFLEX)  BRAIN  NATRIURETIC PEPTIDE  CBC  COMPREHENSIVE METABOLIC PANEL  MAGNESIUM  PHOSPHORUS  TSH  HEMOGLOBIN A1C  URINALYSIS, ROUTINE W REFLEX MICROSCOPIC  LIPID PANEL  LIPID PANEL  LIPOPROTEIN A (LPA)  TROPONIN I (HIGH SENSITIVITY)    EKG EKG Interpretation Date/Time:  Thursday July 11 2023 20:24:55 EST Ventricular Rate:  107 PR Interval:  112 QRS Duration:  78 QT Interval:  354 QTC Calculation: 472 R Axis:   59  Text Interpretation: Sinus tachycardia Otherwise normal ECG When compared with ECG of 14-May-2023 13:31, PREVIOUS ECG IS PRESENT No significant change was found Confirmed by Glynn Octave [phone]) on 07/11/2023 11:23:54 PM  Radiology CT Angio Chest/Abd/Pel for Dissection W and/or Wo Contrast  Result Date: 07/12/2023 CLINICAL DATA:  Acute aortic syndrome suspected. Chest pain, palpitations, and dizziness starting this morning. Hand pain and color changes. EXAM: CT ANGIOGRAPHY CHEST, ABDOMEN AND PELVIS TECHNIQUE: Non-contrast CT of the chest was initially obtained. Multidetector CT imaging through the chest, abdomen and pelvis was performed using the standard protocol during bolus administration of intravenous contrast. Multiplanar reconstructed images and MIPs were obtained and reviewed to evaluate the vascular anatomy. RADIATION DOSE REDUCTION: This exam was performed according to the departmental dose-optimization program which includes automated exposure control, adjustment of the mA and/or kV according to patient size and/or use of iterative reconstruction technique. CONTRAST:  75mL OMNIPAQUE IOHEXOL 350 MG/ML SOLN COMPARISON:  CT chest 03/19/2014.  CT abdomen and pelvis 01/02/2022 FINDINGS: CTA CHEST FINDINGS Cardiovascular: Unenhanced images of the chest demonstrate no significant aortic calcification. No evidence of acute intramural hematoma. Images obtained during arterial phase after intravenous contrast material administration demonstrate normal caliber thoracic aorta. No  aortic dissection. Great vessel origins are patent. Cardiac enlargement. Small pericardial effusion. Central pulmonary arteries are well opacified without evidence of any significant acute pulmonary embolus. Great vessel origins are patent. Mediastinum/Nodes: Thyroid gland is unremarkable. Esophagus is decompressed. No significant mediastinal lymphadenopathy. Mild prominence of axillary lymph nodes bilaterally, largest measuring up to about 8 mm short axis dimension. Similar appearance to prior study, likely reactive. Lungs/Pleura: Motion artifact limits examination. Hazy  infiltrates demonstrated in the lung bases possibly representing edema or pneumonia. No pleural effusions. No pneumothorax. Musculoskeletal: No chest wall abnormality. No acute or significant osseous findings. Review of the MIP images confirms the above findings. CTA ABDOMEN AND PELVIS FINDINGS VASCULAR Aorta: Normal caliber aorta without aneurysm, dissection, vasculitis or significant stenosis. Celiac: Patent without evidence of aneurysm, dissection, vasculitis or significant stenosis. SMA: Patent without evidence of aneurysm, dissection, vasculitis or significant stenosis. Renals: Both renal arteries are patent without evidence of aneurysm, dissection, vasculitis, fibromuscular dysplasia or significant stenosis. IMA: Patent without evidence of aneurysm, dissection, vasculitis or significant stenosis. Inflow: Patent without evidence of aneurysm, dissection, vasculitis or significant stenosis. Veins: No obvious venous abnormality within the limitations of this arterial phase study. Review of the MIP images confirms the above findings. NON-VASCULAR Hepatobiliary: No focal liver abnormality is seen. No gallstones, gallbladder wall thickening, or biliary dilatation. Pancreas: Unremarkable. No pancreatic ductal dilatation or surrounding inflammatory changes. Spleen: Normal in size without focal abnormality. Adrenals/Urinary Tract: Adrenal glands are  unremarkable. Kidneys are normal, without renal calculi, focal lesion, or hydronephrosis. Bladder is unremarkable. Stomach/Bowel: Stomach, small bowel, and colon are not abnormally distended. No wall thickening or inflammatory changes. Appendix is normal. Lymphatic: No significant lymphadenopathy. Reproductive: Uterus and ovaries are not enlarged. Tubal ligation clips. Other: No free air or free fluid in the abdomen. Abdominal wall musculature appears intact. Musculoskeletal: No acute or significant osseous findings. Review of the MIP images confirms the above findings. IMPRESSION: 1. No evidence of aneurysm or dissection involving the thoracic or abdominal aorta. 2. Cardiac enlargement. 3. Hazy infiltrates in the lung bases may represent pneumonia or edema. 4. No acute process suggested in the abdomen or pelvis. 5. Aortic atherosclerosis. Electronically Signed   By: Burman Nieves M.D.   On: 07/12/2023 01:28   CT Head Wo Contrast  Result Date: 07/12/2023 CLINICAL DATA:  Acute neurologic deficit EXAM: CT HEAD WITHOUT CONTRAST TECHNIQUE: Contiguous axial images were obtained from the base of the skull through the vertex without intravenous contrast. RADIATION DOSE REDUCTION: This exam was performed according to the departmental dose-optimization program which includes automated exposure control, adjustment of the mA and/or kV according to patient size and/or use of iterative reconstruction technique. 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 No mass, hemorrhage or extra-axial collection. Vascular: No hyperdense vessel or unexpected calcification. Skull: Normal Sinuses/Orbits: Paranasal sinuses are clear. No mastoid effusion. Right ocular lens replacement and scleral band. Other: None IMPRESSION: 1. No acute intracranial abnormality. 2. Unchanged configuration of the lateral ventricles with posterior hemispheric white  matter loss. Electronically Signed   By: Deatra Robinson M.D.   On: 07/12/2023 01:08   DG Chest 2 View  Result Date: 07/11/2023 CLINICAL DATA:  Chest pain EXAM: CHEST - 2 VIEW COMPARISON:  Chest radiograph 10/01/2021 FINDINGS: The heart size and mediastinal contours are within normal limits. Both lungs are clear. The visualized skeletal structures are unremarkable. IMPRESSION: No active cardiopulmonary disease. Electronically Signed   By: Annia Belt M.D.   On: 07/11/2023 21:17    Procedures .Critical Care  Performed by: Glynn Octave, MD Authorized by: Glynn Octave, MD   Critical care provider statement:    Critical care time (minutes):  45   Critical care time was exclusive of:  Separately billable procedures and treating other patients   Critical care was necessary to treat or prevent imminent or life-threatening deterioration of the following conditions: hypertensive emergency.  Critical care was time spent personally by me on the following activities:  Development of treatment plan with patient or surrogate, discussions with consultants, evaluation of patient's response to treatment, examination of patient, ordering and review of laboratory studies, ordering and review of radiographic studies, ordering and performing treatments and interventions, pulse oximetry, re-evaluation of patient's condition, review of old charts, blood draw for specimens and obtaining history from patient or surrogate   I assumed direction of critical care for this patient from another provider in my specialty: no     Care discussed with: admitting provider       Medications Ordered in ED Medications  labetalol (NORMODYNE) injection 20 mg (has no administration in time range)  hydrALAZINE (APRESOLINE) injection 10 mg (has no administration in time range)  metoCLOPramide (REGLAN) injection 10 mg (has no administration in time range)  hydrALAZINE (APRESOLINE) tablet 25 mg (25 mg Oral Given 07/11/23 2056)     ED Course/ Medical Decision Making/ A&P                                 Medical Decision Making Amount and/or Complexity of Data Reviewed Labs: ordered. Decision-making details documented in ED Course. Radiology: ordered and independent interpretation performed. Decision-making details documented in ED Course. ECG/medicine tests: ordered and independent interpretation performed. Decision-making details documented in ED Course.  Risk Prescription drug management. Decision regarding hospitalization.   Ongoing chest pain or shortness of breath all day.  Significantly hypertensive on arrival.  Right-sided weakness slightly worse than baseline.  EKG is sinus rhythm, no acute ST changes.  Significantly hypertensive on arrival.  Patient given IV labetalol and IV hydralazine.  EKG shows no acute ischemia.  Troponin elevated at 251. Also with some transaminitis. Creatinine at baseline.  She complains of worsening of her right sided weakness.  Unclear last seen normal was sometime yesterday.  CT head is negative for hemorrhage. She is out of the window for any kind of code stroke intervention  Blood pressure has improved to 140/100 after IV labetalol and hydralazine.  Troponin elevation is flat.  CT head is stable without hemorrhage.  CT angiogram is negative for aortic aneurysm or dissection. Does show cardiac enlargement with concern for edema.  Concern for hypertensive emergency with new onset CHF. No echocardiogram in system  Plan admission given uncontrolled blood pressure with ongoing chest pain and elevated troponins.  Discussed with Dr. Maisie Fus.       Final Clinical Impression(s) / ED Diagnoses Final diagnoses:  Hypertensive emergency  Acute congestive heart failure, unspecified heart failure type [facility])    Rx / DC Orders ED Discharge Orders     None         Malaak Stach, Jeannett Senior, MD 07/12/23 [phone]

## 2023-07-12 ENCOUNTER — Inpatient Hospital Stay (HOSPITAL_COMMUNITY): Payer: 59

## 2023-07-12 ENCOUNTER — Other Ambulatory Visit: Payer: Self-pay

## 2023-07-12 ENCOUNTER — Emergency Department (HOSPITAL_COMMUNITY): Payer: 59

## 2023-07-12 DIAGNOSIS — Z91148 Patient's other noncompliance with medication regimen for other reason: Secondary | ICD-10-CM | POA: Diagnosis not present

## 2023-07-12 DIAGNOSIS — I501 Left ventricular failure: Secondary | ICD-10-CM

## 2023-07-12 DIAGNOSIS — G459 Transient cerebral ischemic attack, unspecified: Secondary | ICD-10-CM | POA: Diagnosis not present

## 2023-07-12 DIAGNOSIS — E871 Hypo-osmolality and hyponatremia: Secondary | ICD-10-CM | POA: Diagnosis present

## 2023-07-12 DIAGNOSIS — Z79899 Other long term (current) drug therapy: Secondary | ICD-10-CM | POA: Diagnosis not present

## 2023-07-12 DIAGNOSIS — G40909 Epilepsy, unspecified, not intractable, without status epilepticus: Secondary | ICD-10-CM | POA: Diagnosis present

## 2023-07-12 DIAGNOSIS — I160 Hypertensive urgency: Secondary | ICD-10-CM | POA: Diagnosis present

## 2023-07-12 DIAGNOSIS — I2489 Other forms of acute ischemic heart disease: Secondary | ICD-10-CM | POA: Diagnosis present

## 2023-07-12 DIAGNOSIS — I5033 Acute on chronic diastolic (congestive) heart failure: Secondary | ICD-10-CM | POA: Diagnosis present

## 2023-07-12 DIAGNOSIS — R748 Abnormal levels of other serum enzymes: Secondary | ICD-10-CM

## 2023-07-12 DIAGNOSIS — D509 Iron deficiency anemia, unspecified: Secondary | ICD-10-CM | POA: Diagnosis present

## 2023-07-12 DIAGNOSIS — K068 Other specified disorders of gingiva and edentulous alveolar ridge: Secondary | ICD-10-CM | POA: Diagnosis not present

## 2023-07-12 DIAGNOSIS — Z7951 Long term (current) use of inhaled steroids: Secondary | ICD-10-CM | POA: Diagnosis not present

## 2023-07-12 DIAGNOSIS — G8191 Hemiplegia, unspecified affecting right dominant side: Secondary | ICD-10-CM | POA: Diagnosis present

## 2023-07-12 DIAGNOSIS — I161 Hypertensive emergency: Principal | ICD-10-CM | POA: Diagnosis present

## 2023-07-12 DIAGNOSIS — I5031 Acute diastolic (congestive) heart failure: Secondary | ICD-10-CM

## 2023-07-12 DIAGNOSIS — G43909 Migraine, unspecified, not intractable, without status migrainosus: Secondary | ICD-10-CM | POA: Diagnosis present

## 2023-07-12 DIAGNOSIS — E876 Hypokalemia: Secondary | ICD-10-CM | POA: Diagnosis present

## 2023-07-12 DIAGNOSIS — T465X6A Underdosing of other antihypertensive drugs, initial encounter: Secondary | ICD-10-CM | POA: Diagnosis present

## 2023-07-12 DIAGNOSIS — J45909 Unspecified asthma, uncomplicated: Secondary | ICD-10-CM | POA: Diagnosis present

## 2023-07-12 DIAGNOSIS — T501X5A Adverse effect of loop [high-ceiling] diuretics, initial encounter: Secondary | ICD-10-CM | POA: Diagnosis not present

## 2023-07-12 DIAGNOSIS — Z87891 Personal history of nicotine dependence: Secondary | ICD-10-CM | POA: Diagnosis not present

## 2023-07-12 DIAGNOSIS — G8929 Other chronic pain: Secondary | ICD-10-CM | POA: Diagnosis present

## 2023-07-12 DIAGNOSIS — I110 Hypertensive heart disease with heart failure: Secondary | ICD-10-CM | POA: Diagnosis present

## 2023-07-12 DIAGNOSIS — M25512 Pain in left shoulder: Secondary | ICD-10-CM | POA: Diagnosis present

## 2023-07-12 DIAGNOSIS — N179 Acute kidney failure, unspecified: Secondary | ICD-10-CM | POA: Diagnosis not present

## 2023-07-12 DIAGNOSIS — R7401 Elevation of levels of liver transaminase levels: Secondary | ICD-10-CM | POA: Diagnosis present

## 2023-07-12 DIAGNOSIS — G809 Cerebral palsy, unspecified: Secondary | ICD-10-CM | POA: Diagnosis present

## 2023-07-12 LAB — TROPONIN I (HIGH SENSITIVITY)
Troponin I (High Sensitivity): 225 ng/L (ref <18)
Troponin I (High Sensitivity): 221 ng/L (ref <18)
Troponin I (High Sensitivity): 199 ng/L (ref <18)

## 2023-07-12 LAB — CBC
HCT: 30.9 % — ABNORMAL LOW (ref 36.0–46.0)
Hemoglobin: 9.1 g/dL — ABNORMAL LOW (ref 12.0–15.0)
MCH: 21.7 pg — ABNORMAL LOW (ref 26.0–34.0)
MCHC: 29.4 g/dL — ABNORMAL LOW (ref 30.0–36.0)
MCV: 73.7 fL — ABNORMAL LOW (ref 80.0–100.0)
Platelets: 335 10*3/uL (ref 150–400)
RBC: 4.19 MIL/uL (ref 3.87–5.11)
RDW: 18.8 % — ABNORMAL HIGH (ref 11.5–15.5)
WBC: 5.2 10*3/uL (ref 4.0–10.5)
nRBC: 0 % (ref 0.0–0.2)

## 2023-07-12 LAB — COMPREHENSIVE METABOLIC PANEL
ALT: 109 U/L — ABNORMAL HIGH (ref 0–44)
AST: 193 U/L — ABNORMAL HIGH (ref 15–41)
Albumin: 3.1 g/dL — ABNORMAL LOW (ref 3.5–5.0)
Alkaline Phosphatase: 75 U/L (ref 38–126)
Anion gap: 12 (ref 5–15)
BUN: 10 mg/dL (ref 6–20)
CO2: 25 mmol/L (ref 22–32)
Calcium: 9 mg/dL (ref 8.9–10.3)
Chloride: 93 mmol/L — ABNORMAL LOW (ref 98–111)
Creatinine, Ser: 0.7 mg/dL (ref 0.44–1.00)
GFR, Estimated: >60 mL/min (ref >60)
Glucose, Bld: 110 mg/dL — ABNORMAL HIGH (ref 70–99)
Potassium: 2.8 mmol/L — ABNORMAL LOW (ref 3.5–5.1)
Sodium: 130 mmol/L — ABNORMAL LOW (ref 135–145)
Total Bilirubin: 0.8 mg/dL (ref <1.2)
Total Protein: 10.2 g/dL — ABNORMAL HIGH (ref 6.5–8.1)

## 2023-07-12 LAB — HEPATIC FUNCTION PANEL
ALT: 108 U/L — ABNORMAL HIGH (ref 0–44)
AST: 202 U/L — ABNORMAL HIGH (ref 15–41)
Albumin: 3 g/dL — ABNORMAL LOW (ref 3.5–5.0)
Alkaline Phosphatase: 75 U/L (ref 38–126)
Bilirubin, Direct: <0.1 mg/dL (ref 0.0–0.2)
Total Bilirubin: 0.7 mg/dL (ref <1.2)
Total Protein: 9.7 g/dL — ABNORMAL HIGH (ref 6.5–8.1)

## 2023-07-12 LAB — LIPID PANEL
Cholesterol: 195 mg/dL (ref 0–200)
HDL: 24 mg/dL — ABNORMAL LOW (ref >40)
LDL Cholesterol: 131 mg/dL — ABNORMAL HIGH (ref 0–99)
Total CHOL/HDL Ratio: 8.1 {ratio}
Triglycerides: 202 mg/dL — ABNORMAL HIGH (ref <150)
VLDL: 40 mg/dL (ref 0–40)

## 2023-07-12 LAB — CBG MONITORING, ED: Glucose-Capillary: 93 mg/dL (ref 70–99)

## 2023-07-12 LAB — URINALYSIS, ROUTINE W REFLEX MICROSCOPIC
Bilirubin Urine: NEGATIVE
Glucose, UA: NEGATIVE mg/dL
Ketones, ur: NEGATIVE mg/dL
Leukocytes,Ua: NEGATIVE
Nitrite: NEGATIVE
Protein, ur: 100 mg/dL — AB
Specific Gravity, Urine: 1.008 (ref 1.005–1.030)
pH: 7 (ref 5.0–8.0)

## 2023-07-12 LAB — MAGNESIUM: Magnesium: 1.6 mg/dL — ABNORMAL LOW (ref 1.7–2.4)

## 2023-07-12 LAB — HIV ANTIBODY (ROUTINE TESTING W REFLEX): HIV Screen 4th Generation wRfx: NONREACTIVE

## 2023-07-12 LAB — IRON AND TIBC
Iron: 40 ug/dL (ref 28–170)
Saturation Ratios: 13 % (ref 10.4–31.8)
TIBC: 319 ug/dL (ref 250–450)
UIBC: 279 ug/dL

## 2023-07-12 LAB — RAPID URINE DRUG SCREEN, HOSP PERFORMED
Amphetamines: NOT DETECTED
Barbiturates: NOT DETECTED
Benzodiazepines: NOT DETECTED
Cocaine: NOT DETECTED
Opiates: NOT DETECTED
Tetrahydrocannabinol: NOT DETECTED

## 2023-07-12 LAB — ECHOCARDIOGRAM COMPLETE
AR max vel: 2.01 cm2
AV Area VTI: 1.99 cm2
AV Area mean vel: 1.84 cm2
AV Mean grad: 3 mm[Hg]
AV Peak grad: 6.2 mm[Hg]
Ao pk vel: 1.24 m/s
Area-P 1/2: 6.27 cm2
Height: 59 in
S' Lateral: 2.5 cm
Weight: 2480 [oz_av]

## 2023-07-12 LAB — HEMOGLOBIN A1C
Hgb A1c MFr Bld: 5.1 % (ref 4.8–5.6)
Mean Plasma Glucose: 99.67 mg/dL

## 2023-07-12 LAB — VITAMIN B12: Vitamin B-12: 340 pg/mL (ref 180–914)

## 2023-07-12 LAB — HEPATITIS PANEL, ACUTE
HCV Ab: NONREACTIVE
Hep A IgM: NONREACTIVE
Hep B C IgM: NONREACTIVE
Hepatitis B Surface Ag: NONREACTIVE

## 2023-07-12 LAB — BRAIN NATRIURETIC PEPTIDE: B Natriuretic Peptide: 1017.3 pg/mL — ABNORMAL HIGH (ref 0.0–100.0)

## 2023-07-12 LAB — RETICULOCYTES
Immature Retic Fract: 13.1 % (ref 2.3–15.9)
RBC.: 3.87 MIL/uL (ref 3.87–5.11)
Retic Count, Absolute: 66.2 10*3/uL (ref 19.0–186.0)
Retic Ct Pct: 1.7 % (ref 0.4–3.1)

## 2023-07-12 LAB — TSH: TSH: 5.42 u[IU]/mL — ABNORMAL HIGH (ref 0.350–4.500)

## 2023-07-12 LAB — FOLATE: Folate: 11.9 ng/mL (ref >5.9)

## 2023-07-12 LAB — FERRITIN: Ferritin: 39 ng/mL (ref 11–307)

## 2023-07-12 LAB — LIPASE, BLOOD: Lipase: 25 U/L (ref 11–51)

## 2023-07-12 LAB — PHOSPHORUS: Phosphorus: 4 mg/dL (ref 2.5–4.6)

## 2023-07-12 MED ORDER — ACETAMINOPHEN 325 MG PO TABS
650.0000 mg | ORAL_TABLET | Freq: Four times a day (QID) | ORAL | Status: DC | PRN
  Administered 2023-07-12 – 2023-07-17 (×2): 650 mg via ORAL
  Filled 2023-07-12 (×2): qty 2

## 2023-07-12 MED ORDER — HYDRALAZINE HCL 20 MG/ML IJ SOLN
20.0000 mg | Freq: Four times a day (QID) | INTRAMUSCULAR | Status: DC | PRN
  Administered 2023-07-13: 20 mg via INTRAVENOUS
  Filled 2023-07-12: qty 1

## 2023-07-12 MED ORDER — PERFLUTREN LIPID MICROSPHERE
3.0000 mL | INTRAVENOUS | Status: AC | PRN
  Administered 2023-07-12: 3 mL via INTRAVENOUS

## 2023-07-12 MED ORDER — ASPIRIN 325 MG PO TABS
325.0000 mg | ORAL_TABLET | Freq: Once | ORAL | Status: AC
  Administered 2023-07-12: 325 mg via ORAL
  Filled 2023-07-12: qty 1

## 2023-07-12 MED ORDER — LEVETIRACETAM 500 MG PO TABS
1000.0000 mg | ORAL_TABLET | Freq: Two times a day (BID) | ORAL | Status: DC
  Administered 2023-07-12 – 2023-07-18 (×13): 1000 mg via ORAL
  Filled 2023-07-12 (×14): qty 2

## 2023-07-12 MED ORDER — ONDANSETRON HCL 4 MG PO TABS: 4.0000 mg | ORAL_TABLET | Freq: Four times a day (QID) | ORAL | Status: DC | PRN

## 2023-07-12 MED ORDER — STROKE: EARLY STAGES OF RECOVERY BOOK
Freq: Once | Status: AC
  Filled 2023-07-12: qty 1

## 2023-07-12 MED ORDER — ACETAMINOPHEN 650 MG RE SUPP: 650.0000 mg | Freq: Four times a day (QID) | RECTAL | Status: DC | PRN

## 2023-07-12 MED ORDER — ATORVASTATIN CALCIUM 40 MG PO TABS: 40.0000 mg | ORAL_TABLET | Freq: Every day | ORAL | Status: DC

## 2023-07-12 MED ORDER — CARVEDILOL 12.5 MG PO TABS
12.5000 mg | ORAL_TABLET | Freq: Two times a day (BID) | ORAL | Status: DC
  Administered 2023-07-12 – 2023-07-13 (×3): 12.5 mg via ORAL
  Filled 2023-07-12 (×3): qty 1

## 2023-07-12 MED ORDER — SPIRONOLACTONE 25 MG PO TABS
25.0000 mg | ORAL_TABLET | Freq: Every day | ORAL | Status: DC
  Administered 2023-07-12 – 2023-07-13 (×2): 25 mg via ORAL
  Filled 2023-07-12 (×2): qty 1

## 2023-07-12 MED ORDER — MAGNESIUM SULFATE 4 GM/100ML IV SOLN
4.0000 g | Freq: Once | INTRAVENOUS | Status: AC
  Administered 2023-07-12: 4 g via INTRAVENOUS
  Filled 2023-07-12: qty 100

## 2023-07-12 MED ORDER — FUROSEMIDE 10 MG/ML IJ SOLN
40.0000 mg | Freq: Once | INTRAMUSCULAR | Status: AC
Start: 2023-07-12 — End: 2023-07-12
  Administered 2023-07-12: 40 mg via INTRAVENOUS
  Filled 2023-07-12: qty 4

## 2023-07-12 MED ORDER — IOHEXOL 350 MG/ML SOLN
60.0000 mL | Freq: Once | INTRAVENOUS | Status: AC | PRN
  Administered 2023-07-12: 60 mL via INTRAVENOUS

## 2023-07-12 MED ORDER — HEPARIN SODIUM (PORCINE) 5000 UNIT/ML IJ SOLN
5000.0000 [IU] | Freq: Three times a day (TID) | INTRAMUSCULAR | Status: DC
  Administered 2023-07-12 – 2023-07-18 (×20): 5000 [IU] via SUBCUTANEOUS
  Filled 2023-07-12 (×20): qty 1

## 2023-07-12 MED ORDER — POTASSIUM CHLORIDE 20 MEQ PO PACK
40.0000 meq | PACK | Freq: Once | ORAL | Status: AC
  Administered 2023-07-12: 40 meq via ORAL
  Filled 2023-07-12: qty 2

## 2023-07-12 MED ORDER — KETOROLAC TROMETHAMINE 15 MG/ML IJ SOLN
15.0000 mg | Freq: Once | INTRAMUSCULAR | Status: AC
  Administered 2023-07-12: 15 mg via INTRAVENOUS
  Filled 2023-07-12: qty 1

## 2023-07-12 MED ORDER — POTASSIUM CHLORIDE CRYS ER 20 MEQ PO TBCR
40.0000 meq | EXTENDED_RELEASE_TABLET | Freq: Once | ORAL | Status: AC
  Administered 2023-07-12: 40 meq via ORAL
  Filled 2023-07-12: qty 2

## 2023-07-12 MED ORDER — ISOSORB DINITRATE-HYDRALAZINE 20-37.5 MG PO TABS
2.0000 | ORAL_TABLET | Freq: Three times a day (TID) | ORAL | Status: DC
  Administered 2023-07-12 – 2023-07-14 (×7): 2 via ORAL
  Filled 2023-07-12 (×7): qty 2

## 2023-07-12 MED ORDER — ALBUTEROL SULFATE (2.5 MG/3ML) 0.083% IN NEBU: 2.5000 mg | INHALATION_SOLUTION | RESPIRATORY_TRACT | Status: DC | PRN

## 2023-07-12 MED ORDER — OXYCODONE HCL 5 MG PO TABS
5.0000 mg | ORAL_TABLET | ORAL | Status: DC | PRN
  Administered 2023-07-13 – 2023-07-17 (×5): 5 mg via ORAL
  Filled 2023-07-12 (×5): qty 1

## 2023-07-12 MED ORDER — FUROSEMIDE 10 MG/ML IJ SOLN
40.0000 mg | Freq: Two times a day (BID) | INTRAMUSCULAR | Status: DC
  Administered 2023-07-12 – 2023-07-13 (×3): 40 mg via INTRAVENOUS
  Filled 2023-07-12 (×3): qty 4

## 2023-07-12 MED ORDER — IOHEXOL 350 MG/ML SOLN
75.0000 mL | Freq: Once | INTRAVENOUS | Status: AC | PRN
  Administered 2023-07-12: 75 mL via INTRAVENOUS

## 2023-07-12 MED ORDER — POTASSIUM CHLORIDE 10 MEQ/100ML IV SOLN
10.0000 meq | INTRAVENOUS | Status: AC
Start: 2023-07-12 — End: 2023-07-12
  Administered 2023-07-12 (×4): 10 meq via INTRAVENOUS
  Filled 2023-07-12 (×4): qty 100

## 2023-07-12 MED ORDER — ONDANSETRON HCL 4 MG/2ML IJ SOLN
4.0000 mg | Freq: Four times a day (QID) | INTRAMUSCULAR | Status: DC | PRN
  Administered 2023-07-12 – 2023-07-17 (×6): 4 mg via INTRAVENOUS
  Filled 2023-07-12 (×6): qty 2

## 2023-07-12 NOTE — ED Notes (Signed)
ED TO INPATIENT HANDOFF REPORT  ED Nurse Name and Phone #: Dahlia Client [phone]  S Name/Age/Gender Gavin Potters 37 y.o. female Room/Bed: 005C/005C  Code Status   Code Status: Full Code  Home/SNF/Other Home Patient oriented to: self, place, time, and situation Is this baseline? Yes   Triage Complete: Triage complete  Chief Complaint Hypertensive emergency [I16.1] Hypertensive urgency [I16.0]  Triage Note Patient c/o 10/10 chest pain, palpitations, and dizziness starting this morning, and hand pain and changing colors. Patient does not appear in distress at this time.   Allergies Allergies  Allergen Reactions   Shrimp [Shellfish Allergy] Anaphylaxis    Can eat other shellfish   Codeine Other (See Comments)    GI upset   Dust Mite Extract Other (See Comments)    Unknown reaction   Mold Extract [Trichophyton] Other (See Comments)    Unknown reaction   Penicillins Hives and Other (See Comments)    Has patient had a PCN reaction causing immediate rash, facial/tongue/throat swelling, SOB or lightheadedness with hypotension: No Has patient had a PCN reaction causing severe rash involving mucus membranes or skin necrosis: No Has patient had a PCN reaction that required hospitalization: Yes Has patient had a PCN reaction occurring within the last 10 years: No If all of the above answers are "NO", then may proceed with Cephalosporin use.  Unknown reaction; pt has gotten cefazolin   Soap Other (See Comments)    Unknown reaction    Level of Care/Admitting Diagnosis ED Disposition     ED Disposition  Admit   Condition  --   Comment  Hospital Area: [facility] [[id number]]  Level of Care: Telemetry Cardiac [[id number]]  May [facility] patient to [facility] or [facility] if equivalent level of care is available:: No  Covid Evaluation: Asymptomatic - no recent exposure (last 10 days) testing not required  Diagnosis: Hypertensive urgency [403474]  Admitting Physician:  Leatha Gilding [[id number]]  Attending Physician: Leatha Gilding [id number]  Certification:: I certify this patient will need inpatient services for at least 2 midnights          B Medical/Surgery History Past Medical History:  Diagnosis Date   Allergic rhinitis    Anemia    of other chronic disease   Anxiety    Asthma    Back pain    unknown    Cerebral palsy (HCC)    Headache(784.0)    frequently   Hypertension    Incontinence of urine    Liver disease    Denies 09/10/2018   Low sodium diet    Lumbago 05/21/2014   pain in lower back    Muscle spasm    takes Flexeril daily   Neuromuscular disorder (HCC)    cerebral palsy   Numbness    bilateral feet   Peripheral edema    occasionally   PONV (postoperative nausea and vomiting)    Rhegmatogenous retinal detachment of both eyes    Seizure disorder (HCC)    Seizures (HCC)    last one in high school;takes Depakote daily   Wears glasses    Past Surgical History:  Procedure Laterality Date   CESAREAN SECTION  2007   CESAREAN SECTION WITH BILATERAL TUBAL LIGATION Bilateral 03/03/2015   Procedure: CESAREAN SECTION WITH BILATERAL TUBAL LIGATION;  Surgeon: Willodean Rosenthal, MD;  Location: [facility];  Service: Obstetrics;  Laterality: Bilateral;  Requested 03/03/15 @ 3:30p  Ok per Cleo/Myra-TM   GAS/FLUID EXCHANGE Right 09/11/2018   Procedure:  Gas/Fluid Exchange RIGHT EYE;  Surgeon: Rennis Chris, MD;  Location: [facility] OR;  Service: Ophthalmology;  Laterality: Right;  [id number]   LASER PHOTO ABLATION Left 09/11/2018   Procedure: LASER PHOTO ABLATION LEFT EYE;  Surgeon: Rennis Chris, MD;  Location: [facility] OR;  Service: Ophthalmology;  Laterality: Left;   LEG SURGERY Bilateral    PARS PLANA VITRECTOMY Right 09/11/2018   Procedure: PARS PLANA VITRECTOMY WITH 25 GAUGE RIGHT EYE;  Surgeon: Rennis Chris, MD;  Location[facility] OR;  Service: Ophthalmology;  Laterality: Right;   PERFLUORONE INJECTION Right 09/11/2018   Procedure: Perfluorone Injection  RIGHT EYE;  Surgeon: Rennis Chris, MD;  Location: [facility] OR;  Service: Ophthalmology;  Laterality: Right;   PHOTOCOAGULATION WITH LASER Right 09/11/2018   Procedure: Photocoagulation With Laser RIGHT EYE;  Surgeon: Rennis Chris, MD;  Location: [facility] OR;  Service: Ophthalmology;  Laterality: Right;   SCLERAL BUCKLE Right 09/11/2018   Procedure: Scleral Buckle RIGHT EYE;  Surgeon: Rennis Chris, MD;  Location: [facility] OR;  Service: Ophthalmology;  Laterality: Right;   TOOTH EXTRACTION N/A 02/16/2013   Procedure: EXTRACTION 16, 17, 32;  Surgeon: Georgia Lopes, DDS;  Location: [facility] OR;  Service: Oral Surgery;  Laterality: N/A;     A IV Location/Drains/Wounds Patient Lines/Drains/Airways Status     Active Line/Drains/Airways     Name Placement date Placement time Site Days   Peripheral IV 07/11/23 20 G 1" Left Hand 07/11/23  2330  Hand  1   Peripheral IV 07/12/23 20 G 2.5" Left Antecubital 07/12/23  0023  Antecubital  less than 1   External Urinary Catheter 07/12/23  0221  --  less than 1            Intake/Output Last 24 hours  Intake/Output Summary (Last 24 hours) at 07/12/2023 1618 Last data filed at 07/12/2023 0358 Gross per 24 hour  Intake --  Output 1000 ml  Net -1000 ml    Labs/Imaging Results for orders placed or performed during the hospital encounter of 07/11/23 (from the past 48 hour(s))  Basic metabolic panel     Status: Abnormal   Collection Time: 07/11/23  8:58 PM  Result Value Ref Range   Sodium 132 (L) 135 - 145 mmol/L   Potassium 3.0 (L) 3.5 - 5.1 mmol/L   Chloride 97 (L) 98 - 111 mmol/L   CO2 23 22 - 32 mmol/L   Glucose, Bld 88 70 - 99 mg/dL    Comment: Glucose reference range applies only to samples taken after fasting for at least 8 hours.   BUN 12 6 - 20 mg/dL   Creatinine, Ser 2.95 0.44 - 1.00 mg/dL   Calcium 9.0 8.9 - 62.1 mg/dL   GFR, Estimated >30 >86 mL/min    Comment: (NOTE) Calculated using the CKD-EPI Creatinine Equation (2021)    Anion gap 12 5 - 15     Comment: Performed at [facility], [address], [place], [place] [address]  CBC     Status: Abnormal   Collection Time: 07/11/23  8:58 PM  Result Value Ref Range   WBC 3.7 (L) 4.0 - 10.5 K/uL   RBC 4.00 3.87 - 5.11 MIL/uL   Hemoglobin 8.5 (L) 12.0 - 15.0 g/dL    Comment: Reticulocyte Hemoglobin testing may be clinically indicated, consider ordering this additional test [id number]    HCT 30.1 (L) 36.0 - 46.0 %   MCV 75.3 (L) 80.0 - 100.0 fL   MCH 21.3 (L) 26.0 - 34.0 pg  MCHC 28.2 (L) 30.0 - 36.0 g/dL   RDW 16.1 (H) 09.6 - 04.5 %   Platelets 296 150 - 400 K/uL   nRBC 0.0 0.0 - 0.2 %    Comment: Performed at [facility] Lab, [address] [place], [place] [address]  Troponin I (High Sensitivity)     Status: Abnormal   Collection Time: 07/11/23  8:58 PM  Result Value Ref Range   Troponin I (High Sensitivity) 251 (HH) <18 ng/L    Comment: CRITICAL RESULT CALLED TO, READ BACK BY AND VERIFIED WITH [place], H. RN @ [phone] 07/11/23 JBUTLER (NOTE) Elevated high sensitivity troponin I (hsTnI) values and significant  changes across serial measurements may suggest ACS but many other  chronic and acute conditions are known to elevate hsTnI results.  Refer to the "Links" section for chest pain algorithms and additional  guidance. Performed at [facility], [address] [place], [place] [address]   hCG, serum, qualitative     Status: None   Collection Time: 07/11/23  8:58 PM  Result Value Ref Range   Preg, Serum NEGATIVE NEGATIVE    Comment:        THE SENSITIVITY OF THIS METHODOLOGY IS >10 mIU/mL. Performed at [facility] Lab, [address] [place], [place] [address]   Troponin I (High Sensitivity)     Status: Abnormal   Collection Time: 07/11/23 11:14 PM  Result Value Ref Range   Troponin I (High Sensitivity) 225 (HH) <18 ng/L    Comment: CRITICAL VALUE NOTED. VALUE IS CONSISTENT WITH PREVIOUSLY REPORTED/CALLED VALUE (NOTE) Elevated high sensitivity  troponin I (hsTnI) values and significant  changes across serial measurements may suggest ACS but many other  chronic and acute conditions are known to elevate hsTnI results.  Refer to the "Links" section for chest pain algorithms and additional  guidance. Performed at [facility] Lab, [address], [place], [place] [address]   Hepatic function panel     Status: Abnormal   Collection Time: 07/11/23 11:14 PM  Result Value Ref Range   Total Protein 9.7 (H) 6.5 - 8.1 g/dL   Albumin 3.0 (L) 3.5 - 5.0 g/dL   AST 469 (H) 15 - 41 U/L   ALT 108 (H) 0 - 44 U/L   Alkaline Phosphatase 75 38 - 126 U/L   Total Bilirubin 0.7 <1.2 mg/dL   Bilirubin, Direct <6.2 0.0 - 0.2 mg/dL   Indirect Bilirubin NOT CALCULATED 0.3 - 0.9 mg/dL    Comment: Performed at [facility], [address], [place], [place] [address]  Lipase, blood     Status: None   Collection Time: 07/11/23 11:14 PM  Result Value Ref Range   Lipase 25 11 - 51 U/L    Comment: Performed at [facility], [address], [place], [place] [address]  HIV Antibody (routine testing w rflx)     Status: None   Collection Time: 07/12/23  4:32 AM  Result Value Ref Range   HIV Screen 4th Generation wRfx Non Reactive Non Reactive    Comment: Performed at [facility], [address], [place], [place] [address]  Brain natriuretic peptide     Status: Abnormal   Collection Time: 07/12/23  4:32 AM  Result Value Ref Range   B Natriuretic Peptide 1,017.3 (H) 0.0 - 100.0 pg/mL    Comment: Performed at [facility], [address], [place], [place] [address]  CBC  Status: Abnormal   Collection Time: 07/12/23  4:32 AM  Result Value Ref Range   WBC 5.2 4.0 - 10.5 K/uL   RBC 4.19 3.87 - 5.11 MIL/uL   Hemoglobin 9.1 (L) 12.0 - 15.0 g/dL   HCT 40.9 (L) 81.1 - 91.4 %   MCV 73.7 (L) 80.0 - 100.0 fL   MCH 21.7 (L) 26.0 - 34.0 pg   MCHC 29.4 (L) 30.0 - 36.0 g/dL   RDW 78.2 (H) 95.6 - 21.3 %   Platelets 335 150 - 400 K/uL    nRBC 0.0 0.0 - 0.2 %    Comment: Performed at [facility], [address] [place], [place] [address]  Comprehensive metabolic panel     Status: Abnormal   Collection Time: 07/12/23  4:32 AM  Result Value Ref Range   Sodium 130 (L) 135 - 145 mmol/L   Potassium 2.8 (L) 3.5 - 5.1 mmol/L   Chloride 93 (L) 98 - 111 mmol/L   CO2 25 22 - 32 mmol/L   Glucose, Bld 110 (H) 70 - 99 mg/dL    Comment: Glucose reference range applies only to samples taken after fasting for at least 8 hours.   BUN 10 6 - 20 mg/dL   Creatinine, Ser 8.46 0.44 - 1.00 mg/dL   Calcium 9.0 8.9 - 96.2 mg/dL   Total Protein 95.2 (H) 6.5 - 8.1 g/dL   Albumin 3.1 (L) 3.5 - 5.0 g/dL   AST 841 (H) 15 - 41 U/L   ALT 109 (H) 0 - 44 U/L   Alkaline Phosphatase 75 38 - 126 U/L   Total Bilirubin 0.8 <1.2 mg/dL   GFR, Estimated >32 >44 mL/min    Comment: (NOTE) Calculated using the CKD-EPI Creatinine Equation (2021)    Anion gap 12 5 - 15    Comment: Performed at [facility], [address], [place], [place] [address]  Magnesium     Status: Abnormal   Collection Time: 07/12/23  4:32 AM  Result Value Ref Range   Magnesium 1.6 (L) 1.7 - 2.4 mg/dL    Comment: Performed at [facility], [address] [place], [place] [address]  Phosphorus     Status: None   Collection Time: 07/12/23  4:32 AM  Result Value Ref Range   Phosphorus 4.0 2.5 - 4.6 mg/dL    Comment: Performed at [facility], [address] [place], [place] [address]  TSH     Status: Abnormal   Collection Time: 07/12/23  4:32 AM  Result Value Ref Range   TSH 5.420 (H) 0.350 - 4.500 uIU/mL    Comment: Performed by a 3rd Generation assay with a functional sensitivity of <=0.01 uIU/mL. Performed at [facility], [address], [place], [place] [address]   Hemoglobin A1c     Status: None   Collection Time: 07/12/23  4:32 AM  Result Value Ref Range   Hgb A1c MFr Bld 5.1 4.8 - 5.6 %    Comment: (NOTE) Pre diabetes:           5.7%-6.4%  Diabetes:              >6.4%  Glycemic control for   <7.0% adults with diabetes    Mean Plasma Glucose 99.67 mg/dL    Comment: Performed at [facility], [address] [place], [place] [address]  Urinalysis, Routine w reflex microscopic -Urine, Clean Catch     Status: Abnormal  Collection Time: 07/12/23  4:32 AM  Result Value Ref Range   Color, Urine STRAW (A) YELLOW   APPearance CLEAR CLEAR   Specific Gravity, Urine 1.008 1.005 - 1.030   pH 7.0 5.0 - 8.0   Glucose, UA NEGATIVE NEGATIVE mg/dL   Hgb urine dipstick SMALL (A) NEGATIVE   Bilirubin Urine NEGATIVE NEGATIVE   Ketones, ur NEGATIVE NEGATIVE mg/dL   Protein, ur 284 (A) NEGATIVE mg/dL   Nitrite NEGATIVE NEGATIVE   Leukocytes,Ua NEGATIVE NEGATIVE   RBC / HPF 0-5 0 - 5 RBC/hpf   WBC, UA 0-5 0 - 5 WBC/hpf   Bacteria, UA RARE (A) NONE SEEN   Squamous Epithelial / HPF 0-5 0 - 5 /HPF   Hyaline Casts, UA PRESENT     Comment: Performed at [facility], [address] [place], [place] [address]  Troponin I (High Sensitivity)     Status: Abnormal   Collection Time: 07/12/23  4:32 AM  Result Value Ref Range   Troponin I (High Sensitivity) 221 (HH) <18 ng/L    Comment: CRITICAL VALUE NOTED. VALUE IS CONSISTENT WITH PREVIOUSLY REPORTED/CALLED VALUE (NOTE) Elevated high sensitivity troponin I (hsTnI) values and significant  changes across serial measurements may suggest ACS but many other  chronic and acute conditions are known to elevate hsTnI results.  Refer to the "Links" section for chest pain algorithms and additional  guidance. Performed at [facility], [address], [place], [place] [address]   Lipid panel     Status: Abnormal   Collection Time: 07/12/23  4:32 AM  Result Value Ref Range   Cholesterol 195 0 - 200 mg/dL   Triglycerides 253 (H) <150 mg/dL   HDL 24 (L) >66 mg/dL   Total CHOL/HDL Ratio 8.1 RATIO   VLDL 40 0 - 40 mg/dL   LDL Cholesterol 440 (H) 0 - 99 mg/dL    Comment:         Total Cholesterol/HDL:CHD Risk Coronary Heart Disease Risk Table                     Men   Women  1/2 Average Risk   3.4   3.3  Average Risk       5.0   4.4  2 X Average Risk   9.6   7.1  3 X Average Risk  23.4   11.0        Use the calculated Patient Ratio above and the CHD Risk Table to determine the patient's CHD Risk.        ATP III CLASSIFICATION (LDL):  <100     mg/dL   Optimal  347-425  mg/dL   Near or Above                    Optimal  130-159  mg/dL   Borderline  956-387  mg/dL   High  >564     mg/dL   Very High Performed at [facility], [address], [place], [place] [address]   Urine rapid drug screen (hosp performed)not at [facility]     Status: None   Collection Time: 07/12/23  4:37 AM  Result Value Ref Range   Opiates NONE DETECTED NONE DETECTED   Cocaine NONE DETECTED NONE DETECTED   Benzodiazepines NONE DETECTED NONE DETECTED   Amphetamines NONE DETECTED NONE DETECTED   Tetrahydrocannabinol NONE DETECTED NONE DETECTED   Barbiturates NONE DETECTED NONE DETECTED    Comment: (NOTE)  DRUG SCREEN FOR MEDICAL PURPOSES ONLY.  IF CONFIRMATION IS NEEDED FOR ANY PURPOSE, NOTIFY LAB WITHIN 5 DAYS.  LOWEST DETECTABLE LIMITS FOR URINE DRUG SCREEN Drug Class                     Cutoff (ng/mL) Amphetamine and metabolites    1000 Barbiturate and metabolites    200 Benzodiazepine                 200 Opiates and metabolites        300 Cocaine and metabolites        300 THC                            50 Performed at [facility] Lab, [address] [place], [place] [address]   CBG monitoring, ED     Status: None   Collection Time: 07/12/23  8:05 AM  Result Value Ref Range   Glucose-Capillary 93 70 - 99 mg/dL    Comment: Glucose reference range applies only to samples taken after fasting for at least 8 hours.  Folate     Status: None   Collection Time: 07/12/23  8:06 AM  Result Value Ref Range   Folate 11.9 >5.9 ng/mL    Comment: Performed at [facility], [address] [place], [place] [address]  Troponin I (High Sensitivity)     Status: Abnormal   Collection Time: 07/12/23 12:08 PM  Result Value Ref Range   Troponin I (High Sensitivity) 199 (HH) <18 ng/L    Comment: CRITICAL VALUE NOTED. VALUE IS CONSISTENT WITH PREVIOUSLY REPORTED/CALLED VALUE (NOTE) Elevated high sensitivity troponin I (hsTnI) values and significant  changes across serial measurements may suggest ACS but many other  chronic and acute conditions are known to elevate hsTnI results.  Refer to the "Links" section for chest pain algorithms and additional  guidance. Performed at [facility], [address] [place], [place] [address]   Vitamin B12     Status: None   Collection Time: 07/12/23 12:08 PM  Result Value Ref Range   Vitamin B-12 340 180 - 914 pg/mL    Comment: (NOTE) This assay is not validated for testing neonatal or myeloproliferative syndrome specimens for Vitamin B12 levels. Performed at [facility], [address] [place], [place] [address]   Iron and TIBC     Status: None   Collection Time: 07/12/23 12:08 PM  Result Value Ref Range   Iron 40 28 - 170 ug/dL   TIBC 401 027 - 253 ug/dL   Saturation Ratios 13 10.4 - 31.8 %   UIBC 279 ug/dL    Comment: Performed at [facility], [address], [place], [place] [address]  Ferritin     Status: None   Collection Time: 07/12/23 12:08 PM  Result Value Ref Range   Ferritin 39 11 - 307 ng/mL    Comment: Performed at [facility], [address] [place], [place] [address]  Reticulocytes     Status: None   Collection Time: 07/12/23 12:08 PM  Result Value Ref Range   Retic Ct Pct 1.7 0.4 - 3.1 %   RBC. 3.87 3.87 - 5.11 MIL/uL   Retic Count, Absolute 66.2 19.0 - 186.0 K/uL   Immature Retic Fract 13.1 2.3 - 15.9 %    Comment: Performed at [facility], [address] [place], [place] [address]  Hepatitis panel, acute     Status: None   Collection Time: 07/12/23  12:08 PM  Result Value Ref Range   Hepatitis B Surface Ag NON REACTIVE NON REACTIVE   HCV Ab NON REACTIVE NON REACTIVE    Comment: (NOTE) Nonreactive HCV antibody screen is consistent with no HCV infections,  unless recent infection is suspected or other evidence exists to indicate HCV infection.     Hep A IgM NON REACTIVE NON REACTIVE   Hep B C IgM NON REACTIVE NON REACTIVE    Comment: Performed at [facility], [address], [place], [place] [address]   ECHOCARDIOGRAM COMPLETE  Result Date: 07/12/2023    ECHOCARDIOGRAM REPORT   Patient Name:   Akyra M Bassette Date of Exam: 07/12/2023 Medical Rec #:  [id number]       Height:       59.0 in Accession #:    [id number]      Weight:       155.0 lb Date of Birth:  14-Aug-1985       BSA:          1.655 m Patient Age:    37 years        BP:           142/115 mmHg Patient Gender: F               HR:           96 bpm. Exam Location:  Inpatient Procedure: 2D Echo, Color Doppler, Cardiac Doppler and Intracardiac            Opacification Agent Indications:    CHF  History:        Patient has no prior history of Echocardiogram examinations.                 Risk Factors:Hypertension.  Sonographer:    Milbert Coulter Referring Phys: [id number] SARA-MAIZ A THOMAS IMPRESSIONS  1. Left ventricular ejection fraction, by estimation, is 60 to 65%. The left ventricle has normal function. The left ventricle has no regional wall motion abnormalities. There is severe concentric left ventricular hypertrophy. Left ventricular diastolic  parameters are consistent with Grade II diastolic dysfunction (pseudonormalization).  2. Right ventricular systolic function is normal. The right ventricular size is normal.  3. The mitral valve is grossly normal. Mild mitral valve regurgitation.  4. Tricuspid valve regurgitation is mild to moderate.  5. The aortic valve is grossly normal. Aortic valve regurgitation is not visualized. Conclusion(s)/Recommendation(s): Signs of hypertensive heart  disease, elevated LVEDP. FINDINGS  Left Ventricle: Left ventricular ejection fraction, by estimation, is 60 to 65%. The left ventricle has normal function. The left ventricle has no regional wall motion abnormalities. Definity contrast agent was given IV to delineate the left ventricular  endocardial borders. The left ventricular internal cavity size was normal in size. There is severe concentric left ventricular hypertrophy. Left ventricular diastolic parameters are consistent with Grade II diastolic dysfunction (pseudonormalization). Right Ventricle: The right ventricular size is normal. Right ventricular systolic function is normal. Left Atrium: Left atrial size was normal in size. Right Atrium: Right atrial size was normal in size. Pericardium: There is no evidence of pericardial effusion. Mitral Valve: The mitral valve is grossly normal. Mild mitral valve regurgitation. Tricuspid Valve: Tricuspid valve regurgitation is mild to moderate. Aortic Valve: The aortic valve is grossly normal. Aortic valve regurgitation is not visualized. Aortic valve mean gradient measures 3.0 mmHg. Aortic valve peak  gradient measures 6.2 mmHg. Aortic valve area, by VTI measures 1.99 cm. Pulmonic Valve: Pulmonic valve regurgitation is not visualized. Aorta: The aortic root and ascending aorta are structurally normal, with no evidence of dilitation. IAS/Shunts: No atrial level shunt detected by color flow Doppler.  LEFT VENTRICLE PLAX 2D LVIDd:         3.80 cm   Diastology LVIDs:         2.50 cm   LV e' medial:    6.53 cm/s LV PW:         1.50 cm   LV E/e' medial:  14.0 LV IVS:        1.30 cm   LV e' lateral:   6.53 cm/s LVOT diam:     1.70 cm   LV E/e' lateral: 14.0 LV SV:         42 LV SV Index:   25 LVOT Area:     2.27 cm  RIGHT VENTRICLE RV Basal diam:  2.80 cm RV Mid diam:    2.00 cm RV S prime:     11.00 cm/s TAPSE (M-mode): 1.8 cm LEFT ATRIUM             Index        RIGHT ATRIUM           Index LA diam:        3.50 cm 2.11  cm/m   RA Area:     11.90 cm LA Vol (A2C):   50.5 ml 30.51 ml/m  RA Volume:   24.20 ml  14.62 ml/m LA Vol (A4C):   38.9 ml 23.50 ml/m LA Biplane Vol: 46.1 ml 27.85 ml/m  AORTIC VALVE AV Area (Vmax):    2.01 cm AV Area (Vmean):   1.84 cm AV Area (VTI):     1.99 cm AV Vmax:           124.00 cm/s AV Vmean:          84.100 cm/s AV VTI:            0.209 m AV Peak Grad:      6.2 mmHg AV Mean Grad:      3.0 mmHg LVOT Vmax:         110.00 cm/s LVOT Vmean:        68.100 cm/s LVOT VTI:          0.183 m LVOT/AV VTI ratio: 0.88  AORTA Ao Root diam: 2.50 cm Ao Asc diam:  2.80 cm MITRAL VALVE               TRICUSPID VALVE MV Area (PHT): 6.27 cm    TR Peak grad:   44.6 mmHg MV Decel Time: 121 msec    TR Vmax:        334.00 cm/s MV E velocity: 91.20 cm/s MV A velocity: 58.20 cm/s  SHUNTS MV E/A ratio:  1.57        Systemic VTI:  0.18 m                            Systemic Diam: 1.70 cm Carolan Clines Electronically signed by Carolan Clines Signature Date/Time: 07/12/2023/9:04:55 AM    Final    MR BRAIN WO CONTRAST  Result Date: 07/12/2023 CLINICAL DATA:  37 year old female with cerebral palsy. Headache, dizziness, tunnel vision, right arm paresthesia. TIA. EXAM: MRI HEAD WITHOUT CONTRAST TECHNIQUE: Multiplanar, multiecho pulse sequences of the brain and surrounding structures were  obtained without intravenous contrast. COMPARISON:  CTA head and neck, head CT earlier today. Brain MRI 12/21/2016. FINDINGS: Brain: No restricted diffusion to suggest acute infarction. No midline shift, mass effect, evidence of mass lesion, ventriculomegaly, extra-axial collection or acute intracranial hemorrhage. Cervicomedullary junction and pituitary are within normal limits. Chronic dysplastic appearance of the occipital horns (series 11, image 32) is stable and compatible with neonatal periventricular leukomalacia. But patchy bilateral periventricular white matter T2 and FLAIR hyperintensity appears mildly progressed since 2018 (near the right  frontal horn series 11, image 31) and is nonspecific. Chronic volume loss in the posterior corpus callosum unchanged. And no temporal lobe white matter involvement. No superimposed cortical encephalomalacia or convincing chronic cerebral blood products. Deep gray matter nuclei, brainstem and cerebellum remain within normal limits. Vascular: Major intracranial vascular flow voids are stable. Skull and upper cervical spine: Negative. Visualized bone marrow signal is within normal limits. Sinuses/Orbits: Postoperative changes to the right globe since 2018. Paranasal sinuses and mastoids are stable and well aerated. Other: Grossly normal visible internal auditory structures. Negative visible scalp and face. IMPRESSION: 1. No acute intracranial abnormality. 2. Sequelae of neonatal periventricular leukomalacia. Mildly progressed but nonspecific additional periventricular white matter signal changes since a 2018 MRI. Electronically Signed   By: Odessa Fleming M.D.   On: 07/12/2023 06:20   CT ANGIO HEAD NECK W WO CM  Result Date: 07/12/2023 CLINICAL DATA:  37 year old female with neurologic deficit. EXAM: CT ANGIOGRAPHY HEAD AND NECK TECHNIQUE: Multidetector CT imaging of the head and neck was performed using the standard protocol during bolus administration of intravenous contrast. Multiplanar CT image reconstructions and MIPs were obtained to evaluate the vascular anatomy. Carotid stenosis measurements (when applicable) are obtained utilizing NASCET criteria, using the distal internal carotid diameter as the denominator. RADIATION DOSE REDUCTION: This exam was performed according to the departmental dose-optimization program which includes automated exposure control, adjustment of the mA and/or kV according to patient size and/or use of iterative reconstruction technique. CONTRAST:  60mL OMNIPAQUE IOHEXOL 350 MG/ML SOLN COMPARISON:  Head CT 0051 hours today. Brain MRI 12/21/2016. CTA chest 0053 hours today. FINDINGS: CTA  NECK Skeleton: No acute osseous abnormality identified. Upper chest: Stable to the earlier CTA. Other neck: Negative neck soft tissues. Postoperative changes to the right globe. Aortic arch: Mildly bovine arch configuration with no arch atherosclerosis. Right carotid system: Negative aside from mild tortuosity. Left carotid system: Bovine left CCA origin with no plaque or stenosis. No atherosclerosis or stenosis in the neck. Vertebral arteries: Normal proximal right subclavian artery and right vertebral artery origin. Right vertebral artery appears mildly dominant, patent and normal to the skull base. Proximal left subclavian artery and left vertebral artery origin are normal. Fairly normal size left vertebral artery is patent and normal to the skull base. CTA HEAD Posterior circulation: Normal distal vertebral arteries, PICA origins, vertebrobasilar junction. Mildly dominant right V4. Patent basilar artery without stenosis. Normal SCA and PCA origins. Posterior communicating arteries are diminutive or absent. Bilateral PCA branches are within normal limits. Anterior circulation: Both ICA siphons are patent and appear normal. Patent carotid termini. Normal MCA and ACA origins. Azygous type ACA A2 anatomy. But otherwise the ACA branches are within normal limits. MCA M1 segments and MCA bi/trifurcations appear normal. Bilateral MCA branches are within normal limits. Venous sinuses: Normal. Anatomic variants: Bovine aortic arch configuration. Azygous ACA A2 anatomy. Mildly dominant right vertebral artery. Review of the MIP images confirms the above findings IMPRESSION: Negative CTA Head and Neck. Electronically  Signed   By: Odessa Fleming M.D.   On: 07/12/2023 05:13   CT Angio Chest/Abd/Pel for Dissection W and/or Wo Contrast  Result Date: 07/12/2023 CLINICAL DATA:  Acute aortic syndrome suspected. Chest pain, palpitations, and dizziness starting this morning. Hand pain and color changes. EXAM: CT ANGIOGRAPHY CHEST,  ABDOMEN AND PELVIS TECHNIQUE: Non-contrast CT of the chest was initially obtained. Multidetector CT imaging through the chest, abdomen and pelvis was performed using the standard protocol during bolus administration of intravenous contrast. Multiplanar reconstructed images and MIPs were obtained and reviewed to evaluate the vascular anatomy. RADIATION DOSE REDUCTION: This exam was performed according to the departmental dose-optimization program which includes automated exposure control, adjustment of the mA and/or kV according to patient size and/or use of iterative reconstruction technique. CONTRAST:  75mL OMNIPAQUE IOHEXOL 350 MG/ML SOLN COMPARISON:  CT chest 03/19/2014.  CT abdomen and pelvis 01/02/2022 FINDINGS: CTA CHEST FINDINGS Cardiovascular: Unenhanced images of the chest demonstrate no significant aortic calcification. No evidence of acute intramural hematoma. Images obtained during arterial phase after intravenous contrast material administration demonstrate normal caliber thoracic aorta. No aortic dissection. Great vessel origins are patent. Cardiac enlargement. Small pericardial effusion. Central pulmonary arteries are well opacified without evidence of any significant acute pulmonary embolus. Great vessel origins are patent. Mediastinum/Nodes: Thyroid gland is unremarkable. Esophagus is decompressed. No significant mediastinal lymphadenopathy. Mild prominence of axillary lymph nodes bilaterally, largest measuring up to about 8 mm short axis dimension. Similar appearance to prior study, likely reactive. Lungs/Pleura: Motion artifact limits examination. Hazy infiltrates demonstrated in the lung bases possibly representing edema or pneumonia. No pleural effusions. No pneumothorax. Musculoskeletal: No chest wall abnormality. No acute or significant osseous findings. Review of the MIP images confirms the above findings. CTA ABDOMEN AND PELVIS FINDINGS VASCULAR Aorta: Normal caliber aorta without aneurysm,  dissection, vasculitis or significant stenosis. Celiac: Patent without evidence of aneurysm, dissection, vasculitis or significant stenosis. SMA: Patent without evidence of aneurysm, dissection, vasculitis or significant stenosis. Renals: Both renal arteries are patent without evidence of aneurysm, dissection, vasculitis, fibromuscular dysplasia or significant stenosis. IMA: Patent without evidence of aneurysm, dissection, vasculitis or significant stenosis. Inflow: Patent without evidence of aneurysm, dissection, vasculitis or significant stenosis. Veins: No obvious venous abnormality within the limitations of this arterial phase study. Review of the MIP images confirms the above findings. NON-VASCULAR Hepatobiliary: No focal liver abnormality is seen. No gallstones, gallbladder wall thickening, or biliary dilatation. Pancreas: Unremarkable. No pancreatic ductal dilatation or surrounding inflammatory changes. Spleen: Normal in size without focal abnormality. Adrenals/Urinary Tract: Adrenal glands are unremarkable. Kidneys are normal, without renal calculi, focal lesion, or hydronephrosis. Bladder is unremarkable. Stomach/Bowel: Stomach, small bowel, and colon are not abnormally distended. No wall thickening or inflammatory changes. Appendix is normal. Lymphatic: No significant lymphadenopathy. Reproductive: Uterus and ovaries are not enlarged. Tubal ligation clips. Other: No free air or free fluid in the abdomen. Abdominal wall musculature appears intact. Musculoskeletal: No acute or significant osseous findings. Review of the MIP images confirms the above findings. IMPRESSION: 1. No evidence of aneurysm or dissection involving the thoracic or abdominal aorta. 2. Cardiac enlargement. 3. Hazy infiltrates in the lung bases may represent pneumonia or edema. 4. No acute process suggested in the abdomen or pelvis. 5. Aortic atherosclerosis. Electronically Signed   By: Burman Nieves M.D.   On: 07/12/2023 01:28   CT  Head Wo Contrast  Result Date: 07/12/2023 CLINICAL DATA:  Acute neurologic deficit EXAM: CT HEAD WITHOUT CONTRAST TECHNIQUE: Contiguous axial images were obtained  from the base of the skull through the vertex without intravenous contrast. RADIATION DOSE REDUCTION: This exam was performed according to the departmental dose-optimization program which includes automated exposure control, adjustment of the mA and/or kV according to patient size and/or use of iterative reconstruction technique. 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 No mass, hemorrhage or extra-axial collection. Vascular: No hyperdense vessel or unexpected calcification. Skull: Normal Sinuses/Orbits: Paranasal sinuses are clear. No mastoid effusion. Right ocular lens replacement and scleral band. Other: None IMPRESSION: 1. No acute intracranial abnormality. 2. Unchanged configuration of the lateral ventricles with posterior hemispheric white matter loss. Electronically Signed   By: Deatra Robinson M.D.   On: 07/12/2023 01:08   DG Chest 2 View  Result Date: 07/11/2023 CLINICAL DATA:  Chest pain EXAM: CHEST - 2 VIEW COMPARISON:  Chest radiograph 10/01/2021 FINDINGS: The heart size and mediastinal contours are within normal limits. Both lungs are clear. The visualized skeletal structures are unremarkable. IMPRESSION: No active cardiopulmonary disease. Electronically Signed   By: Annia Belt M.D.   On: 07/11/2023 21:17    Pending Labs Unresulted Labs (From admission, onward)     Start     Ordered   07/13/23 0500  Comprehensive metabolic panel  Tomorrow morning,   R        07/12/23 0807   07/13/23 0500  CBC  Tomorrow morning,   R        07/12/23 0807   07/13/23 0500  Magnesium  Tomorrow morning,   R        07/12/23 0807   07/12/23 0406  Lipoprotein A (LPA)  Once,   R        07/12/23 0405            Vitals/Pain Today's Vitals   07/12/23  1415 07/12/23 1430 07/12/23 1445 07/12/23 1515  BP: 104/66 98/62 108/69 (!) 113/91  Pulse: 97 96 92 91  Resp: (!) 23 20 (!) 25 (!) 25  Temp:    98.4 F (36.9 C)  TempSrc:      SpO2: 100% 100% 100% 100%  Weight:      Height:      PainSc:        Isolation Precautions No active isolations  Medications Medications  levETIRAcetam (KEPPRA) tablet 1,000 mg (1,000 mg Oral Given 07/12/23 0527)  furosemide (LASIX) injection 40 mg (40 mg Intravenous Given 07/12/23 0519)  carvedilol (COREG) tablet 12.5 mg (12.5 mg Oral Given 07/12/23 0527)  isosorbide-hydrALAZINE (BIDIL) 20-37.5 MG per tablet 2 tablet (2 tablets Oral Given 07/12/23 0946)  heparin injection 5,000 Units (5,000 Units Subcutaneous Given 07/12/23 1316)  acetaminophen (TYLENOL) tablet 650 mg (650 mg Oral Given 07/12/23 0946)    Or  acetaminophen (TYLENOL) suppository 650 mg ( Rectal See Alternative 07/12/23 0946)  oxyCODONE (Oxy IR/ROXICODONE) immediate release tablet 5 mg (has no administration in time range)  ondansetron (ZOFRAN) tablet 4 mg ( Oral See Alternative 07/12/23 0409)    Or  ondansetron (ZOFRAN) injection 4 mg (4 mg Intravenous Given 07/12/23 0409)  albuterol (PROVENTIL) (2.5 MG/3ML) 0.083% nebulizer solution 2.5 mg (has no administration in time range)  hydrALAZINE (APRESOLINE) injection 20 mg (has no administration in time range)   stroke: early stages of recovery book (has no administration in time range)  perflutren lipid microspheres (DEFINITY) IV suspension (3 mLs Intravenous Given 07/12/23 0856)  spironolactone (ALDACTONE) tablet 25 mg (25 mg Oral Given 07/12/23 0945)  hydrALAZINE (APRESOLINE)  tablet 25 mg (25 mg Oral Given 07/11/23 2056)  labetalol (NORMODYNE) injection 20 mg (20 mg Intravenous Given 07/11/23 2342)  hydrALAZINE (APRESOLINE) injection 10 mg (10 mg Intravenous Given 07/11/23 2359)  metoCLOPramide (REGLAN) injection 10 mg (10 mg Intravenous Given 07/11/23 2348)  potassium chloride SA (KLOR-CON M) CR tablet 40  mEq (40 mEq Oral Given 07/12/23 0104)  iohexol (OMNIPAQUE) 350 MG/ML injection 75 mL (75 mLs Intravenous Contrast Given 07/12/23 0058)  furosemide (LASIX) injection 40 mg (40 mg Intravenous Given 07/12/23 0220)  ketorolac (TORADOL) 15 MG/ML injection 15 mg (15 mg Intravenous Given 07/12/23 0519)  aspirin tablet 325 mg (325 mg Oral Given 07/12/23 0527)  iohexol (OMNIPAQUE) 350 MG/ML injection 60 mL (60 mLs Intravenous Contrast Given 07/12/23 0454)  potassium chloride 10 mEq in 100 mL IVPB (0 mEq Intravenous Stopped 07/12/23 1208)  potassium chloride (KLOR-CON) packet 40 mEq (40 mEq Oral Given 07/12/23 0634)  magnesium sulfate IVPB 4 g 100 mL (0 g Intravenous Stopped 07/12/23 0908)    Mobility non-ambulatory currently.... says it hurts too much to move.     Focused Assessments Pulmonary Assessment Handoff:  Lung sounds: Bilateral Breath Sounds: Clear O2 Device: Room Air      R Recommendations: See Admitting Provider Note  Report given to:   Additional Notes:

## 2023-07-12 NOTE — H&P (Signed)
History and Physical    Dana Parks:[id number] DOB: Jan 29, 1986 DOA: 07/11/2023  PCP: Fleet Contras, MD  Patient coming from: home  I have personally briefly reviewed patient's old medical records in [facility]  Chief Complaint: chest pain   HPI: Dana Parks is a 37 y.o. female with medical history significant of  Cerebral palsy with right sided weakness, Seizure d/o, Asthma, Anemia, Hypertension, who presents to ED with  acute onset of chest pain. Patient states onset was acute and was right sided pressure with radiation up her back and was associated ,nausea, tunnel vision, dizziness,HA, and paresthesia to her right arm.She also noted  discoloration to her fingers.  She notes no prior episodes like this in the past. She also states  she is currently transitioning from once pcp to another and states she has not seen a primary doctor in a year. She notes that she has in that time ran out of her medications and then has been using urgent care for medication supplies.  She states she feels the medication that she gets at [facility] has not worked as well as her previous medications. She states that she takes her medications when she has them.  Currently she states he right chest is sore as well as right posterior neck.  She notes no current sob, but has nausea.   ED Course:  Vitals:afeb bp  221/139 s/p tx 149/95 hr 111, rr 18, sat 98% OZH:YQMVH tachycardia no st -twave changes  Labs  Wbc 3.7, hgb 8.5, mcv 75.3, plt 296 U neg  NA 132, K 3, CL 97, cr 0.72 CE251,225 Ast 202, ALT 108 Tx  Hydralazine 25mg  po ,labetalol, reglan,lasix CTH  Review of  MPRESSION: 1. No acute intracranial abnormality. 2. Unchanged configuration of the lateral ventricles with posterior hemispheric white matter loss.  CTA chest /abd IMPRESSION: 1. No evidence of aneurysm or dissection involving the thoracic or abdominal aorta. 2. Cardiac enlargement. 3. Hazy infiltrates in the lung bases may  represent pneumonia or edema. 4. No acute process suggested in the abdomen or pelvis. 5. Aortic atherosclerosis.   Systems: As per HPI otherwise 10 point review of systems negative.   Past Medical History:  Diagnosis Date   Allergic rhinitis    Anemia    of other chronic disease   Anxiety    Asthma    Back pain    unknown    Cerebral palsy (HCC)    Headache(784.0)    frequently   Hypertension    Incontinence of urine    Liver disease    Denies 09/10/2018   Low sodium diet    Lumbago 05/21/2014   pain in lower back    Muscle spasm    takes Flexeril daily   Neuromuscular disorder (HCC)    cerebral palsy   Numbness    bilateral feet   Peripheral edema    occasionally   PONV (postoperative nausea and vomiting)    Rhegmatogenous retinal detachment of both eyes    Seizure disorder (HCC)    Seizures (HCC)    last one in high school;takes Depakote daily   Wears glasses     Past Surgical History:  Procedure Laterality Date   CESAREAN SECTION  2007   CESAREAN SECTION WITH BILATERAL TUBAL LIGATION Bilateral 03/03/2015   Procedure: CESAREAN SECTION WITH BILATERAL TUBAL LIGATION;  Surgeon: Willodean Rosenthal, MD;  Location: [facility] ORS;  Service: Obstetrics;  Laterality: Bilateral;  Requested 03/03/15 @ 3:30p  Ok per Cleo/Myra-TM  GAS/FLUID EXCHANGE Right 09/11/2018   Procedure: Gas/Fluid Exchange RIGHT EYE;  Surgeon: Rennis Chris, MD;  Location: [facility] OR;  Service: Ophthalmology;  Laterality: Right;  [id number]   LASER PHOTO ABLATION Left 09/11/2018   Procedure: LASER PHOTO ABLATION LEFT EYE;  Surgeon: Rennis Chris, MD;  Location: [facility] OR;  Service: Ophthalmology;  Laterality: Left;   LEG SURGERY Bilateral    PARS PLANA VITRECTOMY Right 09/11/2018   Procedure: PARS PLANA VITRECTOMY WITH 25 GAUGE RIGHT EYE;  Surgeon: Rennis Chris, MD;  Location: [facility] OR;  Service: Ophthalmology;  Laterality: Right;   PERFLUORONE INJECTION Right 09/11/2018   Procedure: Perfluorone Injection RIGHT EYE;  Surgeon:  Rennis Chris, MD;  [facility] OR;  Service: Ophthalmology;  Laterality: Right;   PHOTOCOAGULATION WITH LASER Right 09/11/2018   Procedure: Photocoagulation With Laser RIGHT EYE;  Surgeon: Rennis Chris, MD;  Location: [facility] OR;  Service: Ophthalmology;  Laterality: Right;   SCLERAL BUCKLE Right 09/11/2018   Procedure: Scleral Buckle RIGHT EYE;  Surgeon: Rennis Chris, MD;  Location: [facility] OR;  Service: Ophthalmology;  Laterality: Right;   TOOTH EXTRACTION N/A 02/16/2013   Procedure: EXTRACTION 16, 17, 32;  Surgeon: Georgia Lopes, DDS;  Location: [facility] OR;  Service: Oral Surgery;  Laterality: N/A;     reports that she quit smoking about 8 years ago. Her smoking use included cigarettes. She started smoking about 15 years ago. She has a 1.8 pack-year smoking history. She has never used smokeless tobacco. She reports current drug use. Drug: Marijuana. She reports that she does not drink alcohol.  Allergies  Allergen Reactions   Shrimp [Shellfish Allergy] Anaphylaxis    Can eat other shellfish   Codeine Other (See Comments)    GI upset   Dust Mite Extract Other (See Comments)    Unknown reaction   Mold Extract [Trichophyton] Other (See Comments)    Unknown reaction   Penicillins Hives and Other (See Comments)    Has patient had a PCN reaction causing immediate rash, facial/tongue/throat swelling, SOB or lightheadedness with hypotension: No Has patient had a PCN reaction causing severe rash involving mucus membranes or skin necrosis: No Has patient had a PCN reaction that required hospitalization: Yes Has patient had a PCN reaction occurring within the last 10 years: No If all of the above answers are "NO", then may proceed with Cephalosporin use.  Unknown reaction; pt has gotten cefazolin   Soap Other (See Comments)    Unknown reaction    Family History  Problem Relation Age of Onset   Diabetes Maternal Grandfather    Hypertension Maternal Grandfather    High blood pressure Mother    High  blood pressure Other        mother's side    Seizures Maternal Uncle    \ Prior to Admission medications   Medication Sig Start Date End Date Taking? Authorizing Provider  albuterol (PROVENTIL HFA;VENTOLIN HFA) 108 (90 BASE) MCG/ACT inhaler Inhale 2 puffs into the lungs every 6 (six) hours as needed for wheezing (wheezing). Patient taking differently: Inhale 2 puffs into the lungs every 6 (six) hours as needed for wheezing. 09/09/14  Yes Anyanwu, Jethro Bastos, MD  amLODipine (NORVASC) 5 MG tablet Take 1 tablet (5 mg total) by mouth at bedtime. 06/21/23  Yes Rinaldo Ratel, [place] N, FNP  budesonide-formoterol [facility]) 160-4.5 MCG/ACT inhaler Inhale 2 puffs into the lungs 2 (two) times daily. Rinse mouth with water after each use 12/23/20  Yes Particia Nearing, PA-C  fluticasone [facility]) 50 MCG/ACT nasal  spray Place 1 spray into both nostrils in the morning and at bedtime. Patient taking differently: Place 1 spray into both nostrils 2 (two) times daily as needed for allergies. 12/23/20  Yes Particia Nearing, PA-C  ibuprofen (ADVIL,MOTRIN) 800 MG tablet Take 1 tablet (800 mg total) by mouth every 8 (eight) hours as needed for moderate pain. Patient taking differently: Take 800 mg by mouth 2 (two) times daily. 05/13/17  Yes Ward, Chase Picket, PA-C  levETIRAcetam (KEPPRA) 1000 MG tablet Take 1 tablet (1,000 mg total) by mouth 2 (two) times daily. 10/01/21  Yes Milagros Loll, MD    Physical Exam: Vitals:   07/12/23 0200 07/12/23 0215 07/12/23 0230 07/12/23 0245  BP: (!) 162/105 (!) 147/104 (!) 158/102 (!) 150/94  Pulse: (!) 106 (!) 103 (!) 103 100  Resp: (!) 24 (!) 31 (!) 27 (!) 23  Temp:      TempSrc:      SpO2: 100% 100% 100% 100%  Weight:      Height:        Constitutional: NAD, calm, comfortable Vitals:   07/12/23 0200 07/12/23 0215 07/12/23 0230 07/12/23 0245  BP: (!) 162/105 (!) 147/104 (!) 158/102 (!) 150/94  Pulse: (!) 106 (!) 103 (!) 103 100  Resp: (!) 24 (!) 31 (!) 27  (!) 23  Temp:      TempSrc:      SpO2: 100% 100% 100% 100%  Weight:      Height:       Eyes: PERRL, lids and conjunctivae normal ENMT: Mucous membranes are moist.  Neck: normal, supple, no masses, no thyromegaly Respiratory: clear to auscultation bilaterally, no wheezing, no crackles. Normal respiratory effort. No accessory muscle use.  Cardiovascular: Regular rate and rhythm, no murmurs / rubs / gallops. No extremity edema. 2+ pedal pulses. No carotid bruits.  Abdomen: no tenderness, no masses palpated. No hepatosplenomegaly. Bowel sounds positive.  Musculoskeletal: no clubbing / cyanosis. No joint deformity upper and lower extremities. Good ROM, no contractures. Normal muscle tone.  Tender posterior right  neck and right chest Skin: no rashes, lesions, ulcers. No induration Neurologic: CN 2-12 grossly intact. Sensation intact,. Strength 5/5 in all 4 except right lower ext 3/5.  Psychiatric: Normal judgment and insight. Alert and oriented x 3. Normal mood.    Labs on Admission: I have personally reviewed following labs and imaging studies  CBC: Recent Labs  Lab 07/11/23 2058  WBC 3.7*  HGB 8.5*  HCT 30.1*  MCV 75.3*  PLT 296   Basic Metabolic Panel: Recent Labs  Lab 07/11/23 2058  NA 132*  K 3.0*  CL 97*  CO2 23  GLUCOSE 88  BUN 12  CREATININE 0.72  CALCIUM 9.0   GFR: Estimated Creatinine Clearance: 81.3 mL/min (by C-G formula based on SCr of 0.72 mg/dL). Liver Function Tests: Recent Labs  Lab 07/11/23 2314  AST 202*  ALT 108*  ALKPHOS 75  BILITOT 0.7  PROT 9.7*  ALBUMIN 3.0*   Recent Labs  Lab 07/11/23 2314  LIPASE 25   No results for input(s): "AMMONIA" in the last 168 hours. Coagulation Profile: No results for input(s): "INR", "PROTIME" in the last 168 hours. Cardiac Enzymes: No results for input(s): "CKTOTAL", "CKMB", "CKMBINDEX", "TROPONINI" in the last 168 hours. BNP (last 3 results) No results for input(s): "PROBNP" in the last 8760  hours. HbA1C: No results for input(s): "HGBA1C" in the last 72 hours. CBG: No results for input(s): "GLUCAP" in the last 168 hours. Lipid Profile:  No results for input(s): "CHOL", "HDL", "LDLCALC", "TRIG", "CHOLHDL", "LDLDIRECT" in the last 72 hours. Thyroid Function Tests: No results for input(s): "TSH", "T4TOTAL", "FREET4", "T3FREE", "THYROIDAB" in the last 72 hours. Anemia Panel: No results for input(s): "VITAMINB12", "FOLATE", "FERRITIN", "TIBC", "IRON", "RETICCTPCT" in the last 72 hours. Urine analysis:    Component Value Date/Time   COLORURINE YELLOW 01/02/2022 1445   APPEARANCEUR CLEAR 01/02/2022 1445   LABSPEC 1.013 01/02/2022 1445   PHURINE 6.0 01/02/2022 1445   GLUCOSEU NEGATIVE 01/02/2022 1445   HGBUR NEGATIVE 01/02/2022 1445   BILIRUBINUR negative 03/16/2023 1632   BILIRUBINUR negative 11/03/2012 1301   KETONESUR negative 03/16/2023 1632   KETONESUR NEGATIVE 01/02/2022 1445   PROTEINUR =100 (A) 03/16/2023 1632   PROTEINUR NEGATIVE 01/02/2022 1445   UROBILINOGEN 0.2 03/16/2023 1632   UROBILINOGEN 1.0 01/17/2015 1143   NITRITE Positive (A) 03/16/2023 1632   NITRITE NEGATIVE 01/02/2022 1445   LEUKOCYTESUR Negative 03/16/2023 1632   LEUKOCYTESUR NEGATIVE 01/02/2022 1445    Radiological Exams on Admission: CT Angio Chest/Abd/Pel for Dissection W and/or Wo Contrast  Result Date: 07/12/2023 CLINICAL DATA:  Acute aortic syndrome suspected. Chest pain, palpitations, and dizziness starting this morning. Hand pain and color changes. EXAM: CT ANGIOGRAPHY CHEST, ABDOMEN AND PELVIS TECHNIQUE: Non-contrast CT of the chest was initially obtained. Multidetector CT imaging through the chest, abdomen and pelvis was performed using the standard protocol during bolus administration of intravenous contrast. Multiplanar reconstructed images and MIPs were obtained and reviewed to evaluate the vascular anatomy. RADIATION DOSE REDUCTION: This exam was performed according to the departmental  dose-optimization program which includes automated exposure control, adjustment of the mA and/or kV according to patient size and/or use of iterative reconstruction technique. CONTRAST:  75mL OMNIPAQUE IOHEXOL 350 MG/ML SOLN COMPARISON:  CT chest 03/19/2014.  CT abdomen and pelvis 01/02/2022 FINDINGS: CTA CHEST FINDINGS Cardiovascular: Unenhanced images of the chest demonstrate no significant aortic calcification. No evidence of acute intramural hematoma. Images obtained during arterial phase after intravenous contrast material administration demonstrate normal caliber thoracic aorta. No aortic dissection. Great vessel origins are patent. Cardiac enlargement. Small pericardial effusion. Central pulmonary arteries are well opacified without evidence of any significant acute pulmonary embolus. Great vessel origins are patent. Mediastinum/Nodes: Thyroid gland is unremarkable. Esophagus is decompressed. No significant mediastinal lymphadenopathy. Mild prominence of axillary lymph nodes bilaterally, largest measuring up to about 8 mm short axis dimension. Similar appearance to prior study, likely reactive. Lungs/Pleura: Motion artifact limits examination. Hazy infiltrates demonstrated in the lung bases possibly representing edema or pneumonia. No pleural effusions. No pneumothorax. Musculoskeletal: No chest wall abnormality. No acute or significant osseous findings. Review of the MIP images confirms the above findings. CTA ABDOMEN AND PELVIS FINDINGS VASCULAR Aorta: Normal caliber aorta without aneurysm, dissection, vasculitis or significant stenosis. Celiac: Patent without evidence of aneurysm, dissection, vasculitis or significant stenosis. SMA: Patent without evidence of aneurysm, dissection, vasculitis or significant stenosis. Renals: Both renal arteries are patent without evidence of aneurysm, dissection, vasculitis, fibromuscular dysplasia or significant stenosis. IMA: Patent without evidence of aneurysm,  dissection, vasculitis or significant stenosis. Inflow: Patent without evidence of aneurysm, dissection, vasculitis or significant stenosis. Veins: No obvious venous abnormality within the limitations of this arterial phase study. Review of the MIP images confirms the above findings. NON-VASCULAR Hepatobiliary: No focal liver abnormality is seen. No gallstones, gallbladder wall thickening, or biliary dilatation. Pancreas: Unremarkable. No pancreatic ductal dilatation or surrounding inflammatory changes. Spleen: Normal in size without focal abnormality. Adrenals/Urinary Tract: Adrenal glands are unremarkable. Kidneys are  normal, without renal calculi, focal lesion, or hydronephrosis. Bladder is unremarkable. Stomach/Bowel: Stomach, small bowel, and colon are not abnormally distended. No wall thickening or inflammatory changes. Appendix is normal. Lymphatic: No significant lymphadenopathy. Reproductive: Uterus and ovaries are not enlarged. Tubal ligation clips. Other: No free air or free fluid in the abdomen. Abdominal wall musculature appears intact. Musculoskeletal: No acute or significant osseous findings. Review of the MIP images confirms the above findings. IMPRESSION: 1. No evidence of aneurysm or dissection involving the thoracic or abdominal aorta. 2. Cardiac enlargement. 3. Hazy infiltrates in the lung bases may represent pneumonia or edema. 4. No acute process suggested in the abdomen or pelvis. 5. Aortic atherosclerosis. Electronically Signed   By: Burman Nieves M.D.   On: 07/12/2023 01:28   CT Head Wo Contrast  Result Date: 07/12/2023 CLINICAL DATA:  Acute neurologic deficit EXAM: CT HEAD WITHOUT CONTRAST TECHNIQUE: Contiguous axial images were obtained from the base of the skull through the vertex without intravenous contrast. RADIATION DOSE REDUCTION: This exam was performed according to the departmental dose-optimization program which includes automated exposure control, adjustment of the mA  and/or kV according to patient size and/or use of iterative reconstruction technique. 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 No mass, hemorrhage or extra-axial collection. Vascular: No hyperdense vessel or unexpected calcification. Skull: Normal Sinuses/Orbits: Paranasal sinuses are clear. No mastoid effusion. Right ocular lens replacement and scleral band. Other: None IMPRESSION: 1. No acute intracranial abnormality. 2. Unchanged configuration of the lateral ventricles with posterior hemispheric white matter loss. Electronically Signed   By: Deatra Robinson M.D.   On: 07/12/2023 01:08   DG Chest 2 View  Result Date: 07/11/2023 CLINICAL DATA:  Chest pain EXAM: CHEST - 2 VIEW COMPARISON:  Chest radiograph 10/01/2021 FINDINGS: The heart size and mediastinal contours are within normal limits. Both lungs are clear. The visualized skeletal structures are unremarkable. IMPRESSION: No active cardiopulmonary disease. Electronically Signed   By: Annia Belt M.D.   On: 07/11/2023 21:17    EKG: Independently reviewed. See above  Assessment/Plan Principal Problem:   Hypertensive emergency   Hypertensive emergency -associated new on chest CHF presumed pef  as well as type II MI -admit to progressive care  -s/p tx with lasix, labetalol ,hydralazine in ED with improvement in BP  -continue lasix  -start carvedilol /bidil  -f/u with echo in am  -cardiology consult for further assistance   Abn CE  -continue to trend CE -EKG w/o any st-twave changeses -presumed due to Type II MI - f/u with echo for further evalaution  -continue to cycle CE overnight  -echo pending -check lipid panel, A1c   TIA symptoms -due to uncontrolled htn  -ct head negative  - f/u with MRI r/o CVA -s/p asa -monitor on neuro check  -continue with tia protocol  -consider neuro consult based on results of exam   Hypokalemia   -replete prn  -check mag   Mild transaminitis  -possible related to hepatocongestion -monitor labs    Hx of CP  Hx of Seizure d/o  -resume keppra  -supportive care for chronic pain   Asthma -prn nebs  -resume symbicort   Anemia  -check anemia panel  -hgb stable   DVT prophylaxis: heparin Code Status: full/ as discussed per patient wishes in event of cardiac arrest  Family Communication: none at bedside Disposition Plan: patient  expected to be admitted greater than 2 midnights  Consults called: cardiology  Admission status: progressive care   Lurline Del MD [facility]   If 7PM-7AM, please contact night-coverage [email or web address] Password [email or web address]  07/12/2023, 4:04 AM

## 2023-07-12 NOTE — Evaluation (Signed)
Physical Therapy Evaluation Patient Details Name: Dana Parks MRN: [id number] DOB: July 04, 1986 Today's Date: 07/12/2023  History of Present Illness  The pt is a 37 yo female presenting 12/5 with R-sided chest pain that radiated up her back. Pt found to have HTN emergency (216/144), undergoing continued work up for acute onset CHF, imaging negative for stroke. PMH includes: limited access to healthcare and compliance with medications, Cerebral Palsy with R-sided weakness, seizure, asthma, anemia, and HTN.   Clinical Impression  Pt in bed upon arrival of PT, agreeable to evaluation at this time. Prior to admission the pt was mobilizing without use of DME at home, reports living with multiple family members, but has limited physical assistance available. She reports a few falls when attempting sit-stand transfers, reports spending most of her time in bed. The pt required modA to complete bed mobility and modA to complete sit-stand transfer from ED stretcher. Will benefit from training with walker, pt asking about trial of rollator for improved balance and endurance. She will benefit from continued skilled PT acutely to progress functional power for sit-stand transfers, gait, and stability to decrease frequency of falls. The pt is agreeable to further rehab, will benefit from inpatient rehab <3hours/day.      If plan is discharge home, recommend the following: A lot of help with walking and/or transfers;A lot of help with bathing/dressing/bathroom;Assistance with cooking/housework;Direct supervision/assist for medications management;Direct supervision/assist for financial management;Help with stairs or ramp for entrance   Can travel by private vehicle   No    Equipment Recommendations Rollator (4 wheels);BSC/3in1  Recommendations for Other Services       Functional Status Assessment Patient has had a recent decline in their functional status and demonstrates the ability to make significant  improvements in function in a reasonable and predictable amount of time.     Precautions / Restrictions Precautions Precautions: Fall Restrictions Weight Bearing Restrictions: No      Mobility  Bed Mobility Overal bed mobility: Needs Assistance Bed Mobility: Supine to Sit, Sit to Supine     Supine to sit: Min assist, Mod assist Sit to supine: Mod assist   General bed mobility comments: minA initially for LE, modA to manage trunk, modA to return to bed    Transfers Overall transfer level: Needs assistance Equipment used: 1 person hand held assist Transfers: Sit to/from Stand Sit to Stand: Mod assist           General transfer comment: minA to rise to standing but modA to return to bed (high ED stretcher)    Ambulation/Gait         Gait velocity: limited to lateral steps along EOB this session due to dizziness and fatigue          Balance Overall balance assessment: Needs assistance Sitting-balance support: Feet supported, Single extremity supported Sitting balance-Leahy Scale: Poor Sitting balance - Comments: limited tolerance for reaching outside BOS   Standing balance support: Single extremity supported, During functional activity Standing balance-Leahy Scale: Poor Standing balance comment: modA with single UE support                             Pertinent Vitals/Pain Pain Assessment Pain Assessment: No/denies pain    Home Living Family/patient expects to be discharged to:: Private residence Living Arrangements: Other relatives Available Help at Discharge: Family;Available 24 hours/day (but limited physical assistance) Type of Home: House Home Access: Stairs to enter Entrance Stairs-Rails: Right;Left;Can reach both  Entrance Stairs-Number of Steps: 3   Home Layout: One level Home Equipment: None      Prior Function Prior Level of Function : Needs assist             Mobility Comments: pt needing assist for sit-stand, assist on  stairs "sometimes" reports "some" recent falls ADLs Comments: pt reports independent but hard for her to complete bathing and dressing.     Extremity/Trunk Assessment   Upper Extremity Assessment Upper Extremity Assessment: Defer to OT evaluation    Lower Extremity Assessment Lower Extremity Assessment: Generalized weakness (chronic RLE weakness from CP, no buckling in stance, limited power. assessment limited by fatigue today. pt denies change in LE sensation)    Cervical / Trunk Assessment Cervical / Trunk Assessment: Kyphotic  Communication   Communication Communication: No apparent difficulties Cueing Techniques: Verbal cues  Cognition Arousal: Alert Behavior During Therapy: Flat affect Overall Cognitive Status: No family/caregiver present to determine baseline cognitive functioning                                 General Comments: flat affect, does answer questions but no family present to confirm. needs cues at times for problem solving        General Comments General comments (skin integrity, edema, etc.): BP stable despite pt reports dizzy    Exercises     Assessment/Plan    PT Assessment Patient needs continued PT services  PT Problem List Decreased strength;Decreased range of motion;Decreased activity tolerance;Decreased balance;Decreased mobility       PT Treatment Interventions DME instruction;Gait training;Functional mobility training;Therapeutic activities;Therapeutic exercise;Balance training    PT Goals (Current goals can be found in the Care Plan section)  Acute Rehab PT Goals Patient Stated Goal: to be able to walk on her own PT Goal Formulation: With patient Time For Goal Achievement: 07/26/23 Potential to Achieve Goals: Fair    Frequency Min 1X/week        AM-PAC PT "6 Clicks" Mobility  Outcome Measure Help needed turning from your back to your side while in a flat bed without using bedrails?: A Lot Help needed moving from  lying on your back to sitting on the side of a flat bed without using bedrails?: A Lot Help needed moving to and from a bed to a chair (including a wheelchair)?: A Lot Help needed standing up from a chair using your arms (e.g., wheelchair or bedside chair)?: A Little Help needed to walk in hospital room?: Total (<20 ft) Help needed climbing 3-5 steps with a railing? : Total 6 Click Score: 11    End of Session Equipment Utilized During Treatment: Gait belt Activity Tolerance: Patient limited by fatigue Patient left: in bed;with call bell/phone within reach Nurse Communication: Mobility status PT Visit Diagnosis: Unsteadiness on feet (R26.81);Muscle weakness (generalized) (M62.81)    Time: 1610-9604 PT Time Calculation (min) (ACUTE ONLY): 25 min   Charges:   PT Evaluation $PT Eval Moderate Complexity: 1 Mod PT Treatments $Therapeutic Activity: 8-22 mins PT General Charges $$ ACUTE PT VISIT: 1 Visit         Vickki Muff, PT, DPT   Acute Rehabilitation Department Office [phone] Secure Chat Communication Preferred  Ronnie Derby 07/12/2023, 2:13 PM

## 2023-07-12 NOTE — Consult Note (Signed)
Cardiology Consultation   Patient ID: Dana Parks MRN: [id number]; DOB: [birth year]  Admit date: 07/11/2023 Date of Consult: 07/12/2023  PCP:  Fleet Contras, MD   [facility] Providers Cardiologist:  None  - New this admission    Patient Profile:   Dana Parks is a 37 y.o. female with a hx of cerebral palsy with right sided weakness, seizure disorder, asthma, anemia, HTN who is being seen 07/12/2023 for the evaluation of HTN emergency at the request of Dr. Elvera Lennox  History of Present Illness:   Ms. Dana Parks is a 37 year old female with above medical history. Patient has been in the process of transitioning from one PCP to another, and estimates that she has not seen her PCP in about 1 year. Since then, she has been using urgent care for her prescriptions and medical supplies.   Patient presented to the ED on 12/5 complaining of acute onset chest pain, right arm paresthesia and discoloration of her fingers. Initial vital signs in the ED showed BP 221/139, HR 111 BPM, oxygen 98% on room air.  Lab showed hsTn 251>225>221. BNP elevated to 1017. CT head showed no acute intracranial abnormality. CTA chest/abd/pelv for dissection showed no evidence of aneurysm or dissection involving the thoracic or abdominal aorta, cardiac enlargement. CXR showed no active cardiopulmonary disease. EKG showed sinus tachycardia, HR 107 BPM.   Patient was admitted to the hospitalist service for treatment of hypertensive emergency. Started on carvedilol 12.5 mg BID, isosorbide-hydralazine 20-37.5 mg TID. BP improved to 142/115 this AM. There is also some concern for CHF- patient was started on IV lasix 40 mg BID. Echocardiogram ordered and pending.   On interview, patient reports that she has not seen her primary care provider in about 1 year. She has not consistently been taking medications for her blood pressure since that time. In may, she was visiting her sister and she started to have issues with  vomiting. Every time she would drink water, she would throw it back up. Since then, for the past 6 months or so, she has been very dizzy. She has been seen twice at urgent cares for her dizziness, and they started her on some blood pressure medications. However, she did not notice any improvement in her blood pressure with medications. Noticed that her blood pressure continued to increase over time. She started to have headaches and blurred vision. Yesterday, she developed right sided chest pain and right arm paresthesias. Felt like her right arm was weak, and she could not pick things up the way she usually could. Her chest pain did not radiate and was worse with palpation. Since coming to the ED, her chest pain has resolved. However, the right chest wall continues to be tender to palpation, reproducing the chest pain that initially caused her to come to the ED. She continues to have a headache and some blurriness to her vision.    Past Medical History:  Diagnosis Date   Allergic rhinitis    Anemia    of other chronic disease   Anxiety    Asthma    Back pain    unknown    Cerebral palsy (HCC)    Headache(784.0)    frequently   Hypertension    Incontinence of urine    Liver disease    Denies 09/10/2018   Low sodium diet    Lumbago 05/21/2014   pain in lower back    Muscle spasm    takes Flexeril daily  Neuromuscular disorder (HCC)    cerebral palsy   Numbness    bilateral feet   Peripheral edema    occasionally   PONV (postoperative nausea and vomiting)    Rhegmatogenous retinal detachment of both eyes    Seizure disorder (HCC)    Seizures (HCC)    last one in high school;takes Depakote daily   Wears glasses     Past Surgical History:  Procedure Laterality Date   CESAREAN SECTION  2007   CESAREAN SECTION WITH BILATERAL TUBAL LIGATION Bilateral 03/03/2015   Procedure: CESAREAN SECTION WITH BILATERAL TUBAL LIGATION;  Surgeon: Willodean Rosenthal, MD;  Location: [facility] ORS;   Service: Obstetrics;  Laterality: Bilateral;  Requested 03/03/15 @ 3:30p  Ok per Cleo/Myra-TM   GAS/FLUID EXCHANGE Right 09/11/2018   Procedure: Gas/Fluid Exchange RIGHT EYE;  Surgeon: Rennis Chris, MD;  Location: [facility] OR;  Service: Ophthalmology;  Laterality: Right;  [id number]   LASER PHOTO ABLATION Left 09/11/2018   Procedure: LASER PHOTO ABLATION LEFT EYE;  Surgeon: Rennis Chris, MD;  Location: [facility] OR;  Service: Ophthalmology;  Laterality: Left;   LEG SURGERY Bilateral    PARS PLANA VITRECTOMY Right 09/11/2018   Procedure: PARS PLANA VITRECTOMY WITH 25 GAUGE RIGHT EYE;  Surgeon: Rennis Chris, MD;  Location: [facility] OR;  Service: Ophthalmology;  Laterality: Right;   PERFLUORONE INJECTION Right 09/11/2018   Procedure: Perfluorone Injection RIGHT EYE;  Surgeon: Rennis Chris, MD;  Location: [facility] OR;  Service: Ophthalmology;  Laterality: Right;   PHOTOCOAGULATION WITH LASER Right 09/11/2018   Procedure: Photocoagulation With Laser RIGHT EYE;  Surgeon: Rennis Chris, MD;  Location: [facility] OR;  Service: Ophthalmology;  Laterality: Right;   SCLERAL BUCKLE Right 09/11/2018   Procedure: Scleral Buckle RIGHT EYE;  Surgeon: Rennis Chris, MD;  Location: [facility] OR;  Service: Ophthalmology;  Laterality: Right;   TOOTH EXTRACTION N/A 02/16/2013   Procedure: EXTRACTION 16, 17, 32;  Surgeon: Georgia Lopes, DDS;  Location: [facility] OR;  Service: Oral Surgery;  Laterality: N/A;     Home Medications:  Prior to Admission medications   Medication Sig Start Date End Date Taking? Authorizing Provider  albuterol (PROVENTIL HFA;VENTOLIN HFA) 108 (90 BASE) MCG/ACT inhaler Inhale 2 puffs into the lungs every 6 (six) hours as needed for wheezing (wheezing). Patient taking differently: Inhale 2 puffs into the lungs every 6 (six) hours as needed for wheezing. 09/09/14  Yes Anyanwu, Jethro Bastos, MD  amLODipine (NORVASC) 5 MG tablet Take 1 tablet (5 mg total) by mouth at bedtime. 06/21/23  Yes Rinaldo Ratel, [place] N, FNP  budesonide-formoterol [facility]) 160-4.5  MCG/ACT inhaler Inhale 2 puffs into the lungs 2 (two) times daily. Rinse mouth with water after each use 12/23/20  Yes Particia Nearing, PA-C  fluticasone [facility]) 50 MCG/ACT nasal spray Place 1 spray into both nostrils in the morning and at bedtime. Patient taking differently: Place 1 spray into both nostrils 2 (two) times daily as needed for allergies. 12/23/20  Yes Particia Nearing, PA-C  ibuprofen (ADVIL,MOTRIN) 800 MG tablet Take 1 tablet (800 mg total) by mouth every 8 (eight) hours as needed for moderate pain. Patient taking differently: Take 800 mg by mouth 2 (two) times daily. 05/13/17  Yes Ward, Chase Picket, PA-C  levETIRAcetam (KEPPRA) 1000 MG tablet Take 1 tablet (1,000 mg total) by mouth 2 (two) times daily. 10/01/21  Yes Milagros Loll, MD    Inpatient Medications: Scheduled Meds:  [START ON 07/13/2023]  stroke: early stages of recovery book   Does not apply Once  carvedilol  12.5 mg Oral BID WC   diphenhydrAMINE  25 mg Intramuscular Once   furosemide  40 mg Intravenous Q12H   heparin  5,000 Units Subcutaneous Q8H   isosorbide-hydrALAZINE  2 tablet Oral TID   levETIRAcetam  1,000 mg Oral BID   Continuous Infusions:  magnesium sulfate bolus IVPB 4 g (07/12/23 0645)   metoCLOPramide (REGLAN) injection     potassium chloride 10 mEq (07/12/23 0735)   PRN Meds: acetaminophen **OR** acetaminophen, albuterol, hydrALAZINE, ondansetron **OR** ondansetron (ZOFRAN) IV, oxyCODONE  Allergies:    Allergies  Allergen Reactions   Shrimp [Shellfish Allergy] Anaphylaxis    Can eat other shellfish   Codeine Other (See Comments)    GI upset   Dust Mite Extract Other (See Comments)    Unknown reaction   Mold Extract [Trichophyton] Other (See Comments)    Unknown reaction   Penicillins Hives and Other (See Comments)    Has patient had a PCN reaction causing immediate rash, facial/tongue/throat swelling, SOB or lightheadedness with hypotension: No Has patient had a PCN  reaction causing severe rash involving mucus membranes or skin necrosis: No Has patient had a PCN reaction that required hospitalization: Yes Has patient had a PCN reaction occurring within the last 10 years: No If all of the above answers are "NO", then may proceed with Cephalosporin use.  Unknown reaction; pt has gotten cefazolin   Soap Other (See Comments)    Unknown reaction    Social History:   Social History   Socioeconomic History   Marital status: Single    Spouse name: Not on file   Number of children: 3   Years of education: high schoo   Highest education level: Not on file  Occupational History   Occupation: Disabled  Tobacco Use   Smoking status: Former    Current packs/day: 0.00    Average packs/day: 0.3 packs/day for 7.0 years (1.8 ttl pk-yrs)    Types: Cigarettes    Start date: 11/05/2007    Quit date: 11/05/2014    Years since quitting: 8.6   Smokeless tobacco: Never   Tobacco comments:    1 cigar/day  Vaping Use   Vaping status: Never Used  Substance and Sexual Activity   Alcohol use: No    Alcohol/week: 0.0 standard drinks of alcohol   Drug use: Yes    Types: Marijuana   Sexual activity: Yes    Partners: Male    Birth control/protection: Surgical  Other Topics Concern   Not on file  Social History Narrative   Lives back and forth with sister and step mother.   Single.    Has 3 children   Social Determinants of Corporate investment banker Strain: Not on file  Food Insecurity: Not on file  Transportation Needs: Not on file  Physical Activity: Not on file  Stress: Not on file  Social Connections: Not on file  Intimate Partner Violence: Not on file    Family History:    Family History  Problem Relation Age of Onset   Diabetes Maternal Grandfather    Hypertension Maternal Grandfather    High blood pressure Mother    High blood pressure Other        mother's side    Seizures Maternal Uncle      ROS:  Please see the history of present  illness.  All other ROS reviewed and negative.     Physical Exam/Data:   Vitals:   07/12/23 0245 07/12/23 0411 07/12/23 1610  07/12/23 0730  BP: (!) 150/94 (!) 183/118 (!) 159/108 (!) 142/115  Pulse: 100 96 (!) 108 97  Resp: (!) 23 17 18  (!) 26  Temp:  98.4 F (36.9 C) 98 F (36.7 C) 98 F (36.7 C)  TempSrc:  Oral Oral Oral  SpO2: 100% 100% 100% 100%  Weight:      Height:        Intake/Output Summary (Last 24 hours) at 07/12/2023 0839 Last data filed at 07/12/2023 0358 Gross per 24 hour  Intake --  Output 1000 ml  Net -1000 ml      07/11/2023   11:15 PM 03/08/2022    9:46 AM 01/02/2022    1:37 PM  Last 3 Weights  Weight (lbs) 155 lb 150 lb 9.6 oz 155 lb  Weight (kg) 70.308 kg 68.312 kg 70.308 kg     Body mass index is 31.31 kg/m.  General:  Well nourished, well developed, in no acute distress. Laying flat in the bed  HEENT: normal Neck: no JVD Cardiac:  normal S1, S2; RRR; no murmur  Lungs:  clear to auscultation bilaterally, no wheezing, rhonchi or rales. Normal work of breathing on room air  Abd: soft, nontender  Ext: no edema in BLE Musculoskeletal:  No deformities  Skin: warm and dry  Psych:  Normal affect   EKG:  The EKG was personally reviewed and demonstrates:  sinus tachycardia, HR 107 BPM Telemetry:  Telemetry was personally reviewed and demonstrates:  Sinus tachycardia, HR in the 100s   Relevant CV Studies:   Laboratory Data:  High Sensitivity Troponin:   Recent Labs  Lab 07/11/23 2058 07/11/23 2314 07/12/23 0432  TROPONINIHS 251* 225* 221*     Chemistry Recent Labs  Lab 07/11/23 2058 07/12/23 0432  NA 132* 130*  K 3.0* 2.8*  CL 97* 93*  CO2 23 25  GLUCOSE 88 110*  BUN 12 10  CREATININE 0.72 0.70  CALCIUM 9.0 9.0  MG  --  1.6*  GFRNONAA >60 >60  ANIONGAP 12 12    Recent Labs  Lab 07/11/23 2314 07/12/23 0432  PROT 9.7* 10.2*  ALBUMIN 3.0* 3.1*  AST 202* 193*  ALT 108* 109*  ALKPHOS 75 75  BILITOT 0.7 0.8   Lipids  Recent  Labs  Lab 07/12/23 0432  CHOL 195  TRIG 202*  HDL 24*  LDLCALC 131*  CHOLHDL 8.1    Hematology Recent Labs  Lab 07/11/23 2058 07/12/23 0432  WBC 3.7* 5.2  RBC 4.00 4.19  HGB 8.5* 9.1*  HCT 30.1* 30.9*  MCV 75.3* 73.7*  MCH 21.3* 21.7*  MCHC 28.2* 29.4*  RDW 18.6* 18.8*  PLT 296 335   Thyroid  Recent Labs  Lab 07/12/23 0432  TSH 5.420*    BNP Recent Labs  Lab 07/12/23 0432  BNP 1,017.3*    DDimer No results for input(s): "DDIMER" in the last 168 hours.   Radiology/Studies:  MR BRAIN WO CONTRAST  Result Date: 07/12/2023 CLINICAL DATA:  37 year old female with cerebral palsy. Headache, dizziness, tunnel vision, right arm paresthesia. TIA. EXAM: MRI HEAD WITHOUT CONTRAST TECHNIQUE: Multiplanar, multiecho pulse sequences of the brain and surrounding structures were obtained without intravenous contrast. COMPARISON:  CTA head and neck, head CT earlier today. Brain MRI 12/21/2016. FINDINGS: Brain: No restricted diffusion to suggest acute infarction. No midline shift, mass effect, evidence of mass lesion, ventriculomegaly, extra-axial collection or acute intracranial hemorrhage. Cervicomedullary junction and pituitary are within normal limits. Chronic dysplastic appearance of the occipital horns (series  11, image 32) is stable and compatible with neonatal periventricular leukomalacia. But patchy bilateral periventricular white matter T2 and FLAIR hyperintensity appears mildly progressed since 2018 (near the right frontal horn series 11, image 32) and is nonspecific. Chronic volume loss in the posterior corpus callosum unchanged. And no temporal lobe white matter involvement. No superimposed cortical encephalomalacia or convincing chronic cerebral blood products. Deep gray matter nuclei, brainstem and cerebellum remain within normal limits. Vascular: Major intracranial vascular flow voids are stable. Skull and upper cervical spine: Negative. Visualized bone marrow signal is within  normal limits. Sinuses/Orbits: Postoperative changes to the right globe since 2018. Paranasal sinuses and mastoids are stable and well aerated. Other: Grossly normal visible internal auditory structures. Negative visible scalp and face. IMPRESSION: 1. No acute intracranial abnormality. 2. Sequelae of neonatal periventricular leukomalacia. Mildly progressed but nonspecific additional periventricular white matter signal changes since a 2018 MRI. Electronically Signed   By: Odessa Fleming M.D.   On: 07/12/2023 06:20   CT ANGIO HEAD NECK W WO CM  Result Date: 07/12/2023 CLINICAL DATA:  37 year old female with neurologic deficit. EXAM: CT ANGIOGRAPHY HEAD AND NECK TECHNIQUE: Multidetector CT imaging of the head and neck was performed using the standard protocol during bolus administration of intravenous contrast. Multiplanar CT image reconstructions and MIPs were obtained to evaluate the vascular anatomy. Carotid stenosis measurements (when applicable) are obtained utilizing NASCET criteria, using the distal internal carotid diameter as the denominator. RADIATION DOSE REDUCTION: This exam was performed according to the departmental dose-optimization program which includes automated exposure control, adjustment of the mA and/or kV according to patient size and/or use of iterative reconstruction technique. CONTRAST:  60mL OMNIPAQUE IOHEXOL 350 MG/ML SOLN COMPARISON:  Head CT 0051 hours today. Brain MRI 12/21/2016. CTA chest 0053 hours today. FINDINGS: CTA NECK Skeleton: No acute osseous abnormality identified. Upper chest: Stable to the earlier CTA. Other neck: Negative neck soft tissues. Postoperative changes to the right globe. Aortic arch: Mildly bovine arch configuration with no arch atherosclerosis. Right carotid system: Negative aside from mild tortuosity. Left carotid system: Bovine left CCA origin with no plaque or stenosis. No atherosclerosis or stenosis in the neck. Vertebral arteries: Normal proximal right  subclavian artery and right vertebral artery origin. Right vertebral artery appears mildly dominant, patent and normal to the skull base. Proximal left subclavian artery and left vertebral artery origin are normal. Fairly normal size left vertebral artery is patent and normal to the skull base. CTA HEAD Posterior circulation: Normal distal vertebral arteries, PICA origins, vertebrobasilar junction. Mildly dominant right V4. Patent basilar artery without stenosis. Normal SCA and PCA origins. Posterior communicating arteries are diminutive or absent. Bilateral PCA branches are within normal limits. Anterior circulation: Both ICA siphons are patent and appear normal. Patent carotid termini. Normal MCA and ACA origins. Azygous type ACA A2 anatomy. But otherwise the ACA branches are within normal limits. MCA M1 segments and MCA bi/trifurcations appear normal. Bilateral MCA branches are within normal limits. Venous sinuses: Normal. Anatomic variants: Bovine aortic arch configuration. Azygous ACA A2 anatomy. Mildly dominant right vertebral artery. Review of the MIP images confirms the above findings IMPRESSION: Negative CTA Head and Neck. Electronically Signed   By: Odessa Fleming M.D.   On: 07/12/2023 05:13   CT Angio Chest/Abd/Pel for Dissection W and/or Wo Contrast  Result Date: 07/12/2023 CLINICAL DATA:  Acute aortic syndrome suspected. Chest pain, palpitations, and dizziness starting this morning. Hand pain and color changes. EXAM: CT ANGIOGRAPHY CHEST, ABDOMEN AND PELVIS TECHNIQUE: Non-contrast CT  of the chest was initially obtained. Multidetector CT imaging through the chest, abdomen and pelvis was performed using the standard protocol during bolus administration of intravenous contrast. Multiplanar reconstructed images and MIPs were obtained and reviewed to evaluate the vascular anatomy. RADIATION DOSE REDUCTION: This exam was performed according to the departmental dose-optimization program which includes automated  exposure control, adjustment of the mA and/or kV according to patient size and/or use of iterative reconstruction technique. CONTRAST:  75mL OMNIPAQUE IOHEXOL 350 MG/ML SOLN COMPARISON:  CT chest 03/19/2014.  CT abdomen and pelvis 01/02/2022 FINDINGS: CTA CHEST FINDINGS Cardiovascular: Unenhanced images of the chest demonstrate no significant aortic calcification. No evidence of acute intramural hematoma. Images obtained during arterial phase after intravenous contrast material administration demonstrate normal caliber thoracic aorta. No aortic dissection. Great vessel origins are patent. Cardiac enlargement. Small pericardial effusion. Central pulmonary arteries are well opacified without evidence of any significant acute pulmonary embolus. Great vessel origins are patent. Mediastinum/Nodes: Thyroid gland is unremarkable. Esophagus is decompressed. No significant mediastinal lymphadenopathy. Mild prominence of axillary lymph nodes bilaterally, largest measuring up to about 8 mm short axis dimension. Similar appearance to prior study, likely reactive. Lungs/Pleura: Motion artifact limits examination. Hazy infiltrates demonstrated in the lung bases possibly representing edema or pneumonia. No pleural effusions. No pneumothorax. Musculoskeletal: No chest wall abnormality. No acute or significant osseous findings. Review of the MIP images confirms the above findings. CTA ABDOMEN AND PELVIS FINDINGS VASCULAR Aorta: Normal caliber aorta without aneurysm, dissection, vasculitis or significant stenosis. Celiac: Patent without evidence of aneurysm, dissection, vasculitis or significant stenosis. SMA: Patent without evidence of aneurysm, dissection, vasculitis or significant stenosis. Renals: Both renal arteries are patent without evidence of aneurysm, dissection, vasculitis, fibromuscular dysplasia or significant stenosis. IMA: Patent without evidence of aneurysm, dissection, vasculitis or significant stenosis. Inflow:  Patent without evidence of aneurysm, dissection, vasculitis or significant stenosis. Veins: No obvious venous abnormality within the limitations of this arterial phase study. Review of the MIP images confirms the above findings. NON-VASCULAR Hepatobiliary: No focal liver abnormality is seen. No gallstones, gallbladder wall thickening, or biliary dilatation. Pancreas: Unremarkable. No pancreatic ductal dilatation or surrounding inflammatory changes. Spleen: Normal in size without focal abnormality. Adrenals/Urinary Tract: Adrenal glands are unremarkable. Kidneys are normal, without renal calculi, focal lesion, or hydronephrosis. Bladder is unremarkable. Stomach/Bowel: Stomach, small bowel, and colon are not abnormally distended. No wall thickening or inflammatory changes. Appendix is normal. Lymphatic: No significant lymphadenopathy. Reproductive: Uterus and ovaries are not enlarged. Tubal ligation clips. Other: No free air or free fluid in the abdomen. Abdominal wall musculature appears intact. Musculoskeletal: No acute or significant osseous findings. Review of the MIP images confirms the above findings. IMPRESSION: 1. No evidence of aneurysm or dissection involving the thoracic or abdominal aorta. 2. Cardiac enlargement. 3. Hazy infiltrates in the lung bases may represent pneumonia or edema. 4. No acute process suggested in the abdomen or pelvis. 5. Aortic atherosclerosis. Electronically Signed   By: Burman Nieves M.D.   On: 07/12/2023 01:28   CT Head Wo Contrast  Result Date: 07/12/2023 CLINICAL DATA:  Acute neurologic deficit EXAM: CT HEAD WITHOUT CONTRAST TECHNIQUE: Contiguous axial images were obtained from the base of the skull through the vertex without intravenous contrast. RADIATION DOSE REDUCTION: This exam was performed according to the departmental dose-optimization program which includes automated exposure control, adjustment of the mA and/or kV according to patient size and/or use of iterative  reconstruction technique. COMPARISON:  10/01/2021 FINDINGS: Brain: Unchanged configuration of the lateral ventricles with  posterior hemispheric white matter loss. No mass, hemorrhage or extra-axial collection. Vascular: No hyperdense vessel or unexpected calcification. Skull: Normal Sinuses/Orbits: Paranasal sinuses are clear. No mastoid effusion. Right ocular lens replacement and scleral band. Other: None IMPRESSION: 1. No acute intracranial abnormality. 2. Unchanged configuration of the lateral ventricles with posterior hemispheric white matter loss. Electronically Signed   By: Deatra Robinson M.D.   On: 07/12/2023 01:08   DG Chest 2 View  Result Date: 07/11/2023 CLINICAL DATA:  Chest pain EXAM: CHEST - 2 VIEW COMPARISON:  Chest radiograph 10/01/2021 FINDINGS: The heart size and mediastinal contours are within normal limits. Both lungs are clear. The visualized skeletal structures are unremarkable. IMPRESSION: No active cardiopulmonary disease. Electronically Signed   By: Annia Belt M.D.   On: 07/11/2023 21:17     Assessment and Plan:   HTN Emergency  - Patient presented complaining of chest pain, headache, blurred vision, right arm paresthesia. She had not consistently been taking BP medications for about 1 year, since she last saw her PCP.  - BP on arrival 221/139  - Now on carvedilol 12.5 mg BID, isosorbide-hydralazine 20-37.5 mg TID - BP improved to 142/115  - Start spironolactone 25 mg daily   Chest Pain  Elevated Troponin  - Patient complained of right sided chest pain that is reproducible on palpation. Atypical for ischemia  - Echocardiogram today showed EF 60-65%, no wall motion abnormalities, normal RV function  - With atypical symptoms, normal EF and no wall motion abnormalities on echocardiogram, she will not need inpatient ischemic evaluation  - hsTn 251>225>221 - Suspect trop elevation is demand ischemia with severely elevated BP   Acute diastolic heart failure  Hypertensive  heart disease  Mild MR  Mild-moderate TR  - Echocardiogram this admission showed EF 60-65%, severe concentric LVH, grade II DD, normal RV function, mild MR, mild-moderate TR. Overall, findings consistent with hypertensive heart disease with elevated LVEDP  - On arrival, BNP elevated to 1017 - Continue IV lasix 40 mg BID  - Start spironolactone 25 mg daily as above   Otherwise per primary  - TIA symptoms- CT head negative. MRI pending  - Hypokalemia- supplemented  - Mild transaminitis  - History of seizure disorder  - Asthma - Anemia    Risk Assessment/Risk Scores:      :[id number]}    New York Heart Association (NYHA) Functional Class NYHA Class III      For questions or updates, please contact [facility] Please consult [email or web address] for contact info under    Signed, Jonita Albee, PA-C  07/12/2023 8:39 AM

## 2023-07-12 NOTE — Plan of Care (Signed)
  Problem: Education: Goal: Knowledge of disease or condition will improve Outcome: Progressing Goal: Knowledge of secondary prevention will improve (MUST DOCUMENT ALL) Outcome: Progressing Goal: Knowledge of patient specific risk factors will improve Loraine Leriche N/A or DELETE if not current risk factor) Outcome: Progressing   Problem: Ischemic Stroke/TIA Tissue Perfusion: Goal: Complications of ischemic stroke/TIA will be minimized Outcome: Progressing   Problem: Coping: Goal: Will verbalize positive feelings about self Outcome: Progressing Goal: Will identify appropriate support needs Outcome: Progressing   Problem: Health Behavior/Discharge Planning: Goal: Ability to manage health-related needs will improve Outcome: Progressing Goal: Goals will be collaboratively established with patient/family Outcome: Progressing   Problem: Self-Care: Goal: Ability to participate in self-care as condition permits will improve Outcome: Progressing   Problem: Self-Care: Goal: Verbalization of feelings and concerns over difficulty with self-care will improve Outcome: Progressing   Problem: Self-Care: Goal: Ability to communicate needs accurately will improve Outcome: Progressing   Problem: Nutrition: Goal: Risk of aspiration will decrease Outcome: Progressing Goal: Dietary intake will improve Outcome: Progressing   Problem: Education: Goal: Knowledge of General Education information will improve Description: Including pain rating scale, medication(s)/side effects and non-pharmacologic comfort measures Outcome: Progressing   Problem: Health Behavior/Discharge Planning: Goal: Ability to manage health-related needs will improve Outcome: Progressing   Problem: Clinical Measurements: Goal: Ability to maintain clinical measurements within normal limits will improve Outcome: Progressing Goal: Will remain free from infection Outcome: Progressing Goal: Diagnostic test results will  improve Outcome: Progressing Goal: Respiratory complications will improve Outcome: Progressing Goal: Cardiovascular complication will be avoided Outcome: Progressing   Problem: Activity: Goal: Risk for activity intolerance will decrease Outcome: Progressing   Problem: Nutrition: Goal: Adequate nutrition will be maintained Outcome: Progressing   Problem: Coping: Goal: Level of anxiety will decrease Outcome: Progressing   Problem: Elimination: Goal: Will not experience complications related to bowel motility Outcome: Progressing Goal: Will not experience complications related to urinary retention Outcome: Progressing   Problem: Pain Management: Goal: General experience of comfort will improve Outcome: Progressing   Problem: Safety: Goal: Ability to remain free from injury will improve Outcome: Progressing   Problem: Skin Integrity: Goal: Risk for impaired skin integrity will decrease Outcome: Progressing   Problem: Education: Goal: Ability to demonstrate management of disease process will improve Outcome: Progressing Goal: Ability to verbalize understanding of medication therapies will improve Outcome: Progressing Goal: Individualized Educational Video(s) Outcome: Progressing   Problem: Activity: Goal: Capacity to carry out activities will improve Outcome: Progressing   Problem: Cardiac: Goal: Ability to achieve and maintain adequate cardiopulmonary perfusion will improve Outcome: Progressing

## 2023-07-12 NOTE — ED Notes (Signed)
Pt c/o headache worsening. Tylenol given

## 2023-07-12 NOTE — Progress Notes (Signed)
Heart Failure Navigator Progress Note  Assessed for Heart & Vascular TOC clinic readiness.  Patient does not meet criteria due to EF 60-65%, admitted with hypertensive emergency, No HF TOC .   Navigator will sign off at this time.    Rhae Hammock, BSN, Scientist, clinical (histocompatibility and immunogenetics) Only

## 2023-07-12 NOTE — Progress Notes (Signed)
PROGRESS NOTE  Dana Parks ZOX:[id number] DOB: Jun 17, 1986 DOA: 07/11/2023 PCP: Fleet Contras, MD   LOS: 0 days   Brief Narrative / Interim history: 37 year old female with cerebral palsy with right-sided weakness, seizure disorder, asthma, anemia, HTN who comes into the hospital with chest pain as well as dizziness.  This happened the day prior to admission.  She reports history of elevated blood pressure however she is in between doctors, just establish care and has run out of all her medications.  She told me she recently went to urgent care and was prescribed amlodipine 5 mg, however felt like this was not enough and she used to take more in the past.  In the ED she was found to have some evidence of fluid overload based on the CT scan of the chest, was significantly hypertensive, and she was admitted to the hospital  Subjective / 24h Interval events: She is doing better this morning, tells me that her headache as well as chest pain are subsiding.  No nausea or vomiting.  Wants to eat  Assesement and Plan: Principal problem Hypertensive emergency-patient was admitted to the hospital with significantly elevated blood pressure and some hazy infiltrates on the lung CT possibly representing edema.  BNP is significantly elevated, along with the troponins.  She also has LFT elevation.  Patient was started on Coreg, furosemide as well as BiDil and blood pressure looks improved today.  Goal BP around 150-160s systolic for now -CT and MRI of the brain without acute findings  Active problems Elevated troponin-likely in the setting of hypertensive emergency, flat, not in a pattern consistent with ACS.  Chest pain is improving with improvement in her blood pressure.  Obtain a 2D echo  Hypokalemia, hypomagnesemia-replace and continue to monitor  Hyponatremia-monitor sodium, may have slight fluid overload  Elevated LFTs-possibly due to #1.  Hold statin for now.  History of seizures-continue  Keppra  Cerebral palsy-noted.  She tells me that she has difficulties ambulating at baseline, has no wheelchair or walker.  Has had occasional falls and when she is down she is very difficult for her to get up.  Obtain PT consult  History of asthma-no wheezing, appears stable  Anemia-check anemia panel  Scheduled Meds:  [START ON 07/13/2023]  stroke: early stages of recovery book   Does not apply Once   carvedilol  12.5 mg Oral BID WC   diphenhydrAMINE  25 mg Intramuscular Once   furosemide  40 mg Intravenous Q12H   heparin  5,000 Units Subcutaneous Q8H   isosorbide-hydrALAZINE  2 tablet Oral TID   levETIRAcetam  1,000 mg Oral BID   Continuous Infusions:  magnesium sulfate bolus IVPB 4 g (07/12/23 0645)   metoCLOPramide (REGLAN) injection     potassium chloride 10 mEq (07/12/23 0735)   PRN Meds:.acetaminophen **OR** acetaminophen, albuterol, hydrALAZINE, ondansetron **OR** ondansetron (ZOFRAN) IV, oxyCODONE  Current Outpatient Medications  Medication Instructions   albuterol (PROVENTIL HFA;VENTOLIN HFA) 108 (90 BASE) MCG/ACT inhaler 2 puffs, Inhalation, Every 6 hours PRN   amLODipine (NORVASC) 5 mg, Oral, Daily at bedtime   budesonide-formoterol (SYMBICORT) 160-4.5 MCG/ACT inhaler 2 puffs, Inhalation, 2 times daily, Rinse mouth with water after each use   fluticasone (FLONASE) 50 MCG/ACT nasal spray 1 spray, Each Nare, 2 times daily   ibuprofen (ADVIL) 800 mg, Oral, Every 8 hours PRN   levETIRAcetam (KEPPRA) 1,000 mg, Oral, 2 times daily    Diet Orders (From admission, onward)     Start     Ordered  07/12/23 0720  Diet Heart Room service appropriate? Yes; Fluid consistency: Thin  Diet effective now       Question Answer Comment  Room service appropriate? Yes   Fluid consistency: Thin      07/12/23 0719            DVT prophylaxis: heparin injection 5,000 Units Start: 07/12/23 0600   Lab Results  Component Value Date   PLT 335 07/12/2023      Code Status: Full  Code  Family Communication: n ofamily at bedside   Status is: Inpatient Remains inpatient appropriate because: severity of illness  Level of care: Progressive  Consultants:  none  Objective: Vitals:   07/12/23 0245 07/12/23 0411 07/12/23 0628 07/12/23 0730  BP: (!) 150/94 (!) 183/118 (!) 159/108 (!) 142/115  Pulse: 100 96 (!) 108 97  Resp: (!) 23 17 18  (!) 26  Temp:  98.4 F (36.9 C) 98 F (36.7 C) 98 F (36.7 C)  TempSrc:  Oral Oral Oral  SpO2: 100% 100% 100% 100%  Weight:      Height:        Intake/Output Summary (Last 24 hours) at 07/12/2023 0801 Last data filed at 07/12/2023 0358 Gross per 24 hour  Intake --  Output 1000 ml  Net -1000 ml   Wt Readings from Last 3 Encounters:  07/11/23 70.3 kg  03/08/22 68.3 kg  01/02/22 70.3 kg    Examination:  Constitutional: NAD Eyes: no scleral icterus ENMT: Mucous membranes are moist.  Neck: normal, supple Respiratory: clear to auscultation bilaterally, no wheezing, no crackles. Normal respiratory effort.  Cardiovascular: Regular rate and rhythm, no murmurs / rubs / gallops. No LE edema.  Abdomen: non distended, no tenderness. Bowel sounds positive.  Musculoskeletal: no clubbing / cyanosis.  Skin: no rashes  Data Reviewed: I have independently reviewed following labs and imaging studies   CBC Recent Labs  Lab 07/11/23 2058 07/12/23 0432  WBC 3.7* 5.2  HGB 8.5* 9.1*  HCT 30.1* 30.9*  PLT 296 335  MCV 75.3* 73.7*  MCH 21.3* 21.7*  MCHC 28.2* 29.4*  RDW 18.6* 18.8*    Recent Labs  Lab 07/11/23 2058 07/11/23 2314 07/12/23 0432  NA 132*  --  130*  K 3.0*  --  2.8*  CL 97*  --  93*  CO2 23  --  25  GLUCOSE 88  --  110*  BUN 12  --  10  CREATININE 0.72  --  0.70  CALCIUM 9.0  --  9.0  AST  --  202* 193*  ALT  --  108* 109*  ALKPHOS  --  75 75  BILITOT  --  0.7 0.8  ALBUMIN  --  3.0* 3.1*  MG  --   --  1.6*  TSH  --   --  5.420*  HGBA1C  --   --  5.1  BNP  --   --  1,017.3*     ------------------------------------------------------------------------------------------------------------------ Recent Labs    07/12/23 0432  CHOL 195  HDL 24*  LDLCALC 131*  TRIG 202*  CHOLHDL 8.1    Lab Results  Component Value Date   HGBA1C 5.1 07/12/2023   ------------------------------------------------------------------------------------------------------------------ Recent Labs    07/12/23 0432  TSH 5.420*    Cardiac Enzymes No results for input(s): "CKMB", "TROPONINI", "MYOGLOBIN" in the last 168 hours.  Invalid input(s): "CK" ------------------------------------------------------------------------------------------------------------------    Component Value Date/Time   BNP 1,017.3 (H) 07/12/2023 0432    CBG: No results for input(s): "  GLUCAP" in the last 168 hours.  No results found for this or any previous visit (from the past 240 hour(s)).   Radiology Studies: MR BRAIN WO CONTRAST  Result Date: 07/12/2023 CLINICAL DATA:  37 year old female with cerebral palsy. Headache, dizziness, tunnel vision, right arm paresthesia. TIA. EXAM: MRI HEAD WITHOUT CONTRAST TECHNIQUE: Multiplanar, multiecho pulse sequences of the brain and surrounding structures were obtained without intravenous contrast. COMPARISON:  CTA head and neck, head CT earlier today. Brain MRI 12/21/2016. FINDINGS: Brain: No restricted diffusion to suggest acute infarction. No midline shift, mass effect, evidence of mass lesion, ventriculomegaly, extra-axial collection or acute intracranial hemorrhage. Cervicomedullary junction and pituitary are within normal limits. Chronic dysplastic appearance of the occipital horns (series 11, image 32) is stable and compatible with neonatal periventricular leukomalacia. But patchy bilateral periventricular white matter T2 and FLAIR hyperintensity appears mildly progressed since 2018 (near the right frontal horn series 11, image 32) and is nonspecific. Chronic  volume loss in the posterior corpus callosum unchanged. And no temporal lobe white matter involvement. No superimposed cortical encephalomalacia or convincing chronic cerebral blood products. Deep gray matter nuclei, brainstem and cerebellum remain within normal limits. Vascular: Major intracranial vascular flow voids are stable. Skull and upper cervical spine: Negative. Visualized bone marrow signal is within normal limits. Sinuses/Orbits: Postoperative changes to the right globe since 2018. Paranasal sinuses and mastoids are stable and well aerated. Other: Grossly normal visible internal auditory structures. Negative visible scalp and face. IMPRESSION: 1. No acute intracranial abnormality. 2. Sequelae of neonatal periventricular leukomalacia. Mildly progressed but nonspecific additional periventricular white matter signal changes since a 2018 MRI. Electronically Signed   By: Odessa Fleming M.D.   On: 07/12/2023 06:20   CT ANGIO HEAD NECK W WO CM  Result Date: 07/12/2023 CLINICAL DATA:  37 year old female with neurologic deficit. EXAM: CT ANGIOGRAPHY HEAD AND NECK TECHNIQUE: Multidetector CT imaging of the head and neck was performed using the standard protocol during bolus administration of intravenous contrast. Multiplanar CT image reconstructions and MIPs were obtained to evaluate the vascular anatomy. Carotid stenosis measurements (when applicable) are obtained utilizing NASCET criteria, using the distal internal carotid diameter as the denominator. RADIATION DOSE REDUCTION: This exam was performed according to the departmental dose-optimization program which includes automated exposure control, adjustment of the mA and/or kV according to patient size and/or use of iterative reconstruction technique. CONTRAST:  60mL OMNIPAQUE IOHEXOL 350 MG/ML SOLN COMPARISON:  Head CT 0051 hours today. Brain MRI 12/21/2016. CTA chest 0053 hours today. FINDINGS: CTA NECK Skeleton: No acute osseous abnormality identified. Upper  chest: Stable to the earlier CTA. Other neck: Negative neck soft tissues. Postoperative changes to the right globe. Aortic arch: Mildly bovine arch configuration with no arch atherosclerosis. Right carotid system: Negative aside from mild tortuosity. Left carotid system: Bovine left CCA origin with no plaque or stenosis. No atherosclerosis or stenosis in the neck. Vertebral arteries: Normal proximal right subclavian artery and right vertebral artery origin. Right vertebral artery appears mildly dominant, patent and normal to the skull base. Proximal left subclavian artery and left vertebral artery origin are normal. Fairly normal size left vertebral artery is patent and normal to the skull base. CTA HEAD Posterior circulation: Normal distal vertebral arteries, PICA origins, vertebrobasilar junction. Mildly dominant right V4. Patent basilar artery without stenosis. Normal SCA and PCA origins. Posterior communicating arteries are diminutive or absent. Bilateral PCA branches are within normal limits. Anterior circulation: Both ICA siphons are patent and appear normal. Patent carotid termini. Normal MCA  and ACA origins. Azygous type ACA A2 anatomy. But otherwise the ACA branches are within normal limits. MCA M1 segments and MCA bi/trifurcations appear normal. Bilateral MCA branches are within normal limits. Venous sinuses: Normal. Anatomic variants: Bovine aortic arch configuration. Azygous ACA A2 anatomy. Mildly dominant right vertebral artery. Review of the MIP images confirms the above findings IMPRESSION: Negative CTA Head and Neck. Electronically Signed   By: Odessa Fleming M.D.   On: 07/12/2023 05:13   CT Angio Chest/Abd/Pel for Dissection W and/or Wo Contrast  Result Date: 07/12/2023 CLINICAL DATA:  Acute aortic syndrome suspected. Chest pain, palpitations, and dizziness starting this morning. Hand pain and color changes. EXAM: CT ANGIOGRAPHY CHEST, ABDOMEN AND PELVIS TECHNIQUE: Non-contrast CT of the chest was  initially obtained. Multidetector CT imaging through the chest, abdomen and pelvis was performed using the standard protocol during bolus administration of intravenous contrast. Multiplanar reconstructed images and MIPs were obtained and reviewed to evaluate the vascular anatomy. RADIATION DOSE REDUCTION: This exam was performed according to the departmental dose-optimization program which includes automated exposure control, adjustment of the mA and/or kV according to patient size and/or use of iterative reconstruction technique. CONTRAST:  75mL OMNIPAQUE IOHEXOL 350 MG/ML SOLN COMPARISON:  CT chest 03/19/2014.  CT abdomen and pelvis 01/02/2022 FINDINGS: CTA CHEST FINDINGS Cardiovascular: Unenhanced images of the chest demonstrate no significant aortic calcification. No evidence of acute intramural hematoma. Images obtained during arterial phase after intravenous contrast material administration demonstrate normal caliber thoracic aorta. No aortic dissection. Great vessel origins are patent. Cardiac enlargement. Small pericardial effusion. Central pulmonary arteries are well opacified without evidence of any significant acute pulmonary embolus. Great vessel origins are patent. Mediastinum/Nodes: Thyroid gland is unremarkable. Esophagus is decompressed. No significant mediastinal lymphadenopathy. Mild prominence of axillary lymph nodes bilaterally, largest measuring up to about 8 mm short axis dimension. Similar appearance to prior study, likely reactive. Lungs/Pleura: Motion artifact limits examination. Hazy infiltrates demonstrated in the lung bases possibly representing edema or pneumonia. No pleural effusions. No pneumothorax. Musculoskeletal: No chest wall abnormality. No acute or significant osseous findings. Review of the MIP images confirms the above findings. CTA ABDOMEN AND PELVIS FINDINGS VASCULAR Aorta: Normal caliber aorta without aneurysm, dissection, vasculitis or significant stenosis. Celiac: Patent  without evidence of aneurysm, dissection, vasculitis or significant stenosis. SMA: Patent without evidence of aneurysm, dissection, vasculitis or significant stenosis. Renals: Both renal arteries are patent without evidence of aneurysm, dissection, vasculitis, fibromuscular dysplasia or significant stenosis. IMA: Patent without evidence of aneurysm, dissection, vasculitis or significant stenosis. Inflow: Patent without evidence of aneurysm, dissection, vasculitis or significant stenosis. Veins: No obvious venous abnormality within the limitations of this arterial phase study. Review of the MIP images confirms the above findings. NON-VASCULAR Hepatobiliary: No focal liver abnormality is seen. No gallstones, gallbladder wall thickening, or biliary dilatation. Pancreas: Unremarkable. No pancreatic ductal dilatation or surrounding inflammatory changes. Spleen: Normal in size without focal abnormality. Adrenals/Urinary Tract: Adrenal glands are unremarkable. Kidneys are normal, without renal calculi, focal lesion, or hydronephrosis. Bladder is unremarkable. Stomach/Bowel: Stomach, small bowel, and colon are not abnormally distended. No wall thickening or inflammatory changes. Appendix is normal. Lymphatic: No significant lymphadenopathy. Reproductive: Uterus and ovaries are not enlarged. Tubal ligation clips. Other: No free air or free fluid in the abdomen. Abdominal wall musculature appears intact. Musculoskeletal: No acute or significant osseous findings. Review of the MIP images confirms the above findings. IMPRESSION: 1. No evidence of aneurysm or dissection involving the thoracic or abdominal aorta. 2. Cardiac enlargement. 3.  Hazy infiltrates in the lung bases may represent pneumonia or edema. 4. No acute process suggested in the abdomen or pelvis. 5. Aortic atherosclerosis. Electronically Signed   By: Burman Nieves M.D.   On: 07/12/2023 01:28   CT Head Wo Contrast  Result Date: 07/12/2023 CLINICAL DATA:   Acute neurologic deficit EXAM: CT HEAD WITHOUT CONTRAST TECHNIQUE: Contiguous axial images were obtained from the base of the skull through the vertex without intravenous contrast. RADIATION DOSE REDUCTION: This exam was performed according to the departmental dose-optimization program which includes automated exposure control, adjustment of the mA and/or kV according to patient size and/or use of iterative reconstruction technique. 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 No mass, hemorrhage or extra-axial collection. Vascular: No hyperdense vessel or unexpected calcification. Skull: Normal Sinuses/Orbits: Paranasal sinuses are clear. No mastoid effusion. Right ocular lens replacement and scleral band. Other: None IMPRESSION: 1. No acute intracranial abnormality. 2. Unchanged configuration of the lateral ventricles with posterior hemispheric white matter loss. Electronically Signed   By: Deatra Robinson M.D.   On: 07/12/2023 01:08   DG Chest 2 View  Result Date: 07/11/2023 CLINICAL DATA:  Chest pain EXAM: CHEST - 2 VIEW COMPARISON:  Chest radiograph 10/01/2021 FINDINGS: The heart size and mediastinal contours are within normal limits. Both lungs are clear. The visualized skeletal structures are unremarkable. IMPRESSION: No active cardiopulmonary disease. Electronically Signed   By: Annia Belt M.D.   On: 07/11/2023 21:17     Pamella Pert, MD, PhD [facility]  Between 7 am - 7 pm I am available, please contact me via Amion (for emergencies) or Securechat (non urgent messages)  Between 7 pm - 7 am I am not available, please contact night coverage MD/APP via Amion

## 2023-07-12 NOTE — ED Notes (Signed)
Patient transported to vascular. 

## 2023-07-12 NOTE — ED Notes (Signed)
Nutrition called and food tray being sent.

## 2023-07-13 DIAGNOSIS — I161 Hypertensive emergency: Secondary | ICD-10-CM | POA: Diagnosis not present

## 2023-07-13 LAB — COMPREHENSIVE METABOLIC PANEL
ALT: 84 U/L — ABNORMAL HIGH (ref 0–44)
AST: 130 U/L — ABNORMAL HIGH (ref 15–41)
Albumin: 2.9 g/dL — ABNORMAL LOW (ref 3.5–5.0)
Alkaline Phosphatase: 65 U/L (ref 38–126)
Anion gap: 11 (ref 5–15)
BUN: 20 mg/dL (ref 6–20)
CO2: 24 mmol/L (ref 22–32)
Calcium: 9.1 mg/dL (ref 8.9–10.3)
Chloride: 96 mmol/L — ABNORMAL LOW (ref 98–111)
Creatinine, Ser: 1.34 mg/dL — ABNORMAL HIGH (ref 0.44–1.00)
GFR, Estimated: 52 mL/min — ABNORMAL LOW (ref >60)
Glucose, Bld: 121 mg/dL — ABNORMAL HIGH (ref 70–99)
Potassium: 4.8 mmol/L (ref 3.5–5.1)
Sodium: 131 mmol/L — ABNORMAL LOW (ref 135–145)
Total Bilirubin: 0.8 mg/dL (ref <1.2)
Total Protein: 9.3 g/dL — ABNORMAL HIGH (ref 6.5–8.1)

## 2023-07-13 LAB — CBC
HCT: 30.2 % — ABNORMAL LOW (ref 36.0–46.0)
Hemoglobin: 8.9 g/dL — ABNORMAL LOW (ref 12.0–15.0)
MCH: 21.7 pg — ABNORMAL LOW (ref 26.0–34.0)
MCHC: 29.5 g/dL — ABNORMAL LOW (ref 30.0–36.0)
MCV: 73.7 fL — ABNORMAL LOW (ref 80.0–100.0)
Platelets: 343 10*3/uL (ref 150–400)
RBC: 4.1 MIL/uL (ref 3.87–5.11)
RDW: 18.7 % — ABNORMAL HIGH (ref 11.5–15.5)
WBC: 5.7 10*3/uL (ref 4.0–10.5)
nRBC: 0 % (ref 0.0–0.2)

## 2023-07-13 LAB — GLUCOSE, CAPILLARY
Glucose-Capillary: 123 mg/dL — ABNORMAL HIGH (ref 70–99)
Glucose-Capillary: 93 mg/dL (ref 70–99)

## 2023-07-13 LAB — LIPOPROTEIN A (LPA): Lipoprotein (a): 61.9 nmol/L — ABNORMAL HIGH (ref <75.0)

## 2023-07-13 LAB — MAGNESIUM: Magnesium: 1.8 mg/dL (ref 1.7–2.4)

## 2023-07-13 MED ORDER — METOPROLOL TARTRATE 5 MG/5ML IV SOLN: 2.5000 mg | INTRAVENOUS | Status: DC

## 2023-07-13 MED ORDER — LABETALOL HCL 5 MG/ML IV SOLN: 20.0000 mg | Freq: Four times a day (QID) | INTRAVENOUS | Status: DC | PRN

## 2023-07-13 MED ORDER — SPIRONOLACTONE 25 MG PO TABS
25.0000 mg | ORAL_TABLET | Freq: Once | ORAL | Status: AC
  Administered 2023-07-13: 25 mg via ORAL
  Filled 2023-07-13: qty 1

## 2023-07-13 MED ORDER — SODIUM CHLORIDE 0.9 % IV SOLN
12.5000 mg | INTRAVENOUS | Status: AC
  Administered 2023-07-13: 12.5 mg via INTRAVENOUS
  Filled 2023-07-13 (×2): qty 0.5

## 2023-07-13 MED ORDER — LABETALOL HCL 5 MG/ML IV SOLN
20.0000 mg | Freq: Four times a day (QID) | INTRAVENOUS | Status: DC | PRN
  Administered 2023-07-13: 20 mg via INTRAVENOUS
  Filled 2023-07-13: qty 4

## 2023-07-13 MED ORDER — CARVEDILOL 25 MG PO TABS
25.0000 mg | ORAL_TABLET | Freq: Two times a day (BID) | ORAL | Status: DC
Start: 2023-07-13 — End: 2023-07-18
  Administered 2023-07-13 – 2023-07-18 (×10): 25 mg via ORAL
  Filled 2023-07-13 (×11): qty 1

## 2023-07-13 MED ORDER — SPIRONOLACTONE 25 MG PO TABS
50.0000 mg | ORAL_TABLET | Freq: Every day | ORAL | Status: DC
  Administered 2023-07-14 – 2023-07-15 (×2): 50 mg via ORAL
  Filled 2023-07-13 (×2): qty 2

## 2023-07-13 NOTE — Progress Notes (Signed)
   07/13/23 1425  Assess: MEWS Score  BP (!) 159/106  Pulse Rate 98  ECG Heart Rate (!) 103  Resp (!) 21  SpO2 100 %  O2 Device Room Air  Assess: MEWS Score  MEWS Temp 0  MEWS Systolic 0  MEWS Pulse 1  MEWS RR 1  MEWS LOC 0  MEWS Score 2  MEWS Score Color Yellow  Assess: if the MEWS score is Yellow or Red  Were vital signs accurate and taken at a resting state? Yes  Does the patient meet 2 or more of the SIRS criteria? Yes  Does the patient have a confirmed or suspected source of infection? No  MEWS guidelines implemented  Yes, yellow  Treat  MEWS Interventions Considered administering scheduled or prn medications/treatments as ordered  Take Vital Signs  Increase Vital Sign Frequency  Yellow: Q2hr x1, continue Q4hrs until patient remains green for 12hrs  Escalate  MEWS: Escalate Yellow: Discuss with charge nurse and consider notifying provider and/or RRT  Notify: Charge Nurse/RN  Name of Charge Nurse/RN Notified [facility]  Provider Notification  Provider Name/Title Dr Elvera Lennox  Method of Notification  (secure chat)  Date of Provider Response 07/13/23  Time of Provider Response 1430  Assess: SIRS CRITERIA  SIRS Temperature  0  SIRS Pulse 1  SIRS Respirations  1  SIRS WBC 0  SIRS Score Sum  2

## 2023-07-13 NOTE — Progress Notes (Addendum)
Hypertensive emergency: Patient's nurse reported that patient heart rate is 124 and blood pressure is 209/122 even after patient received hydralazine 10 mg. -Per chart review patient has been admitted for hypertensive urgency. -Currently on spironolactone, Coreg and BiDil. -I have gave labetalol 20 mg with that blood pressure has been improved to 180/116 and heart rate has been improved 124 to 92.  - Changing hydralazine as needed to labetalol as needed 20 mg every 6 hour for systolic blood pressure above 161 and diastolic blood pressure above 096. -As patient is both on Coreg 25 twice daily and labetalol as needed requested bedside nurse to monitor her heart rate very closely.  If heart rate drops below 80 need to inform MD and instruction to hold the labetalol.  -Patient also reported nausea and headache has been improved after blood pressure has been improved as well.   Tereasa Coop, MD [facility] 07/13/2023, 11:56 PM

## 2023-07-13 NOTE — Evaluation (Signed)
Occupational Therapy Evaluation Patient Details Name: Dana Parks MRN: [id number] DOB: 1985/09/23 Today's Date: 07/13/2023   History of Present Illness The pt is a 37 yo female presenting 12/5 with R-sided chest pain that radiated up her back. Pt found to have HTN emergency (216/144), undergoing continued work up for acute onset CHF, imaging negative for stroke. PMH includes: limited access to healthcare and compliance with medications due to access issues, Cerebral Palsy with R-sided weakness, seizure, asthma, anemia, and HTN.   Clinical Impression   At baseline pt in largely Independent to Mod I with ADLs with assist for tub transfers and IADLs PRN. At baseline, pt typically performs functional mobility without an AD but reports occasionally holding on to family and recent falls. Pt now presents with decreased activity tolerance, generalized B UE weakness, decreased B UE fine and gross motor coordination, decreased balance during functional tasks, decreased knowledge of AE and DME, pain affecting functional level, dizziness and nausea affecting functional level, and decreased safety and independence with functional tasks. Pt with elevated but stable BP (with RN aware) and all other VSS on RA throughout session. Pt currently demonstrates ability to complete UB ADLs with Set up to Mod assist from bed level, LB ADLs with Max assist from bed level, and bed mobility with Mod to Max assist. Functional transfers/mobility deferred this session due to pt dizziness and nausea. Pt participated well in session and is motivated to participate in skilled rehab. Pt will benefit from acute skilled OT services to address deficits outlined below and increase safety and independence with functional tasks. Post acute discharge, pt will benefit from intensive inpatient skilled rehab services < 3 hours per day to maximize rehab potential.       If plan is discharge home, recommend the following: Two people to help with  walking and/or transfers;A lot of help with bathing/dressing/bathroom;Assistance with cooking/housework;Assistance with feeding;Assist for transportation;Help with stairs or ramp for entrance (set-up for self-feeding)    Functional Status Assessment  Patient has had a recent decline in their functional status and demonstrates the ability to make significant improvements in function in a reasonable and predictable amount of time.  Equipment Recommendations  BSC/3in1;Other (comment) (Rollator)    Recommendations for Other Services       Precautions / Restrictions Precautions Precautions: Fall Restrictions Weight Bearing Restrictions: No      Mobility Bed Mobility Overal bed mobility: Needs Assistance Bed Mobility: Rolling Rolling: Mod assist, Used rails   Supine to sit: Mod assist, HOB elevated, Used rails Sit to supine: Mod assist, Max assist   General bed mobility comments: Pt limited by nausea and dizziness this session. Pt reporting it is easier to transition to Left side of bed at baseline.    Transfers Overall transfer level: Needs assistance                 General transfer comment: deferred this session due to nausea and dizziness      Balance Overall balance assessment: Needs assistance Sitting-balance support: Single extremity supported, Bilateral upper extremity supported, Feet supported Sitting balance-Leahy Scale: Poor Sitting balance - Comments: limited sitting tolerance                                   ADL either performed or assessed with clinical judgement   ADL Overall ADL's : Needs assistance/impaired Eating/Feeding: Set up;Bed level (with HOB elevated) Eating/Feeding Details (indicate cue type and  reason): will benefit from built-up grips for utensils Grooming: Set up;Wash/dry hands;Wash/dry face;Oral care;Bed level (with HOB elevated) Grooming Details (indicate cue type and reason): will benefit from built up grip for  toothbrush Upper Body Bathing: Moderate assistance;Bed level;Cueing for compensatory techniques Upper Body Bathing Details (indicate cue type and reason): with increased time and effort Lower Body Bathing: Maximal assistance;Bed level   Upper Body Dressing : Minimal assistance;Bed level;Cueing for compensatory techniques (with HOB elevated) Upper Body Dressing Details (indicate cue type and reason): with increased time and effort Lower Body Dressing: Maximal assistance;Bed level;Cueing for compensatory techniques     Toilet Transfer Details (indicate cue type and reason): deferred this day due to pt with increased nausea with movement Toileting- Clothing Manipulation and Hygiene: Maximal assistance;Bed level         General ADL Comments: Pt with decreased activity tolerance and reported fear of "doing too much." Pt participates well with encouragement, reassurance, and positive reinforcement. Pt limited this day by nausea and dizziness.     Vision Baseline Vision/History: 1 Wears glasses (hx of B retinal detachment with surgical correction) Ability to See in Adequate Light: 1 Impaired Patient Visual Report: No change from baseline (Pt reports occasional blurry vision, but reports no blurry vision this day.) Vision Assessment?: Vision impaired- to be further tested in functional context     Perception         Praxis         Pertinent Vitals/Pain Pain Assessment Pain Assessment: Faces Faces Pain Scale: Hurts even more Pain Location: B shoulder with movement; R shoulder at rest and worse with movement Pain Descriptors / Indicators: Aching, Grimacing, Guarding, Sore Pain Intervention(s): Limited activity within patient's tolerance, Monitored during session, Repositioned     Extremity/Trunk Assessment Upper Extremity Assessment Upper Extremity Assessment: Left hand dominant;Generalized weakness;RUE deficits/detail;LUE deficits/detail RUE Deficits / Details: generalized  weakness; impaired shoulder AROM at baseline with PROM limited to approx. 100 degrees flexion; decreased fine and gross motor coordination, reports feelings of "weakness" and "heaviness." Pt reports occasional numbenss, but none today. RUE Sensation: WNL RUE Coordination: decreased fine motor;decreased gross motor LUE Deficits / Details: generalized weakness; impaired shoulder AROM at baseline with PROM WFL; decreased fine and gross motor coordination, reports hx of shoulder "popping out," but "fine right now." LUE Sensation: WNL LUE Coordination: decreased fine motor;decreased gross motor   Lower Extremity Assessment Lower Extremity Assessment: Defer to PT evaluation   Cervical / Trunk Assessment Cervical / Trunk Assessment: Kyphotic   Communication Communication Communication: No apparent difficulties Cueing Techniques: Verbal cues   Cognition Arousal: Alert Behavior During Therapy: WFL for tasks assessed/performed Overall Cognitive Status: Within Functional Limits for tasks assessed                                 General Comments: Largely WFL. AAOx4 and pleasant throughout session. Requires increased time for processing and motor planning. Able to follow 2-step instructions consistantly with increased time.     General Comments  Pt reports nausea and dizziness throughout session worse with movement with BP elevated but stable (with RN aware) and all other VSS on RA throughout session.    Exercises     Shoulder Instructions      Home Living Family/patient expects to be discharged to:: Private residence Living Arrangements: Children;Parent (Stepmother and children ages 64 and 22 yo) Available Help at Discharge: Family;Available 24 hours/day (limited physical assistance due to stepmother having  health issues, but family able to help with meal prep, transportation, etc.) Type of Home: House Home Access: Stairs to enter [employer] of Steps: 3 Entrance  Stairs-Rails: Right;Left;Can reach both Home Layout: One level     Bathroom Shower/Tub: Chief Strategy Officer: Standard Bathroom Accessibility: Yes How Accessible: Accessible via walker Home Equipment: Shower seat;Grab bars - tub/shower;Hand held shower head   Additional Comments: Pt would benefit from a tub bench but states one will not fit in her bathroom.      Prior Functioning/Environment Prior Level of Function : Needs assist             Mobility Comments: Pt often needing Min assist for sit to stand and occasional assist with stairs and car transfers. Pt reports "some" recent falls due to "weakness" in her legs. Pt also reports significant effort and increased time to lift B legs into bed. Pt reports easier to perform bed transfers on the Left side. ADLs Comments: Pt is largely Independent to Mod I with ADLs, but reports difficulty and increased time for LB bathing/dressing and needing assist for tub transfer.        OT Problem List: Decreased strength;Decreased activity tolerance;Decreased range of motion;Impaired balance (sitting and/or standing);Decreased coordination;Decreased knowledge of use of DME or AE;Pain;Impaired UE functional use      OT Treatment/Interventions: Self-care/ADL training;Therapeutic exercise;Energy conservation;DME and/or AE instruction;Therapeutic activities;Patient/family education    OT Goals(Current goals can be found in the care plan section) Acute Rehab OT Goals Patient Stated Goal: to feel better and return home with family OT Goal Formulation: With patient Time For Goal Achievement: 07/27/23 Potential to Achieve Goals: Good ADL Goals Pt Will Perform Grooming: with supervision;sitting (sitting at EOB for 3 or more minutes with Fair balance; with use of adaptive grips/adaptive equipment as needed) Pt Will Perform Lower Body Bathing: with contact guard assist;with adaptive equipment;sitting/lateral leans;sit to/from stand Pt  Will Perform Lower Body Dressing: with contact guard assist;with adaptive equipment;sitting/lateral leans;sit to/from stand Pt Will Transfer to Toilet: with supervision;ambulating;bedside commode (with least restrictive AD) Pt/caregiver will Perform Home Exercise Program: Increased strength;Increased ROM;Both right and left upper extremity;With Supervision;With written HEP provided;With theraputty (increased activity tolerance; AROM progressing to theraband) Additional ADL Goal #1: Patient will demonstrate ability to complete bed mobility during/in preparation for functional tasks with Supervision with use of adaptive equipment (such as a leg lifter) as needed.  OT Frequency: Min 1X/week    Co-evaluation              AM-PAC OT "6 Clicks" Daily Activity     Outcome Measure Help from another person eating meals?: A Little Help from another person taking care of personal grooming?: A Little Help from another person toileting, which includes using toliet, bedpan, or urinal?: A Lot Help from another person bathing (including washing, rinsing, drying)?: A Lot Help from another person to put on and taking off regular upper body clothing?: A Little Help from another person to put on and taking off regular lower body clothing?: A Lot 6 Click Score: 15   End of Session Nurse Communication: Mobility status;Other (comment) (Pt reports nausea and dizziness with VSS.)  Activity Tolerance: Patient tolerated treatment well;Other (comment) (Patient limited by nausea and dizziness.) Patient left: in bed;with call bell/phone within reach;with bed alarm set  OT Visit Diagnosis: Muscle weakness (generalized) (M62.81);History of falling (Z91.81);Ataxia, unspecified (R27.0);Pain;Other (comment) (decreased activity tolerance)  Time: 4403-4742 OT Time Calculation (min): 46 min Charges:  OT General Charges $OT Visit: 1 Visit OT Evaluation $OT Eval Low Complexity: 1 Low OT Treatments $Self  Care/Home Management : 23-37 mins  Bailei Buist "Orson Eva., OTR/L, MA Acute Rehab [phone]   Lendon Colonel 07/13/2023, 11:13 AM

## 2023-07-13 NOTE — Progress Notes (Signed)
Rounding Note    Patient Name: Dana Parks Date of Encounter: 07/13/2023  [facility] Cardiologist: Jodelle Red, MD   Subjective   She does not feel well this morning; has a vomit bag near her  Creatinine 0.7-1.34 today   Inpatient Medications    Scheduled Meds:   stroke: early stages of recovery book   Does not apply Once   carvedilol  12.5 mg Oral BID WC   heparin  5,000 Units Subcutaneous Q8H   isosorbide-hydrALAZINE  2 tablet Oral TID   levETIRAcetam  1,000 mg Oral BID   spironolactone  25 mg Oral Daily   Continuous Infusions:  PRN Meds: acetaminophen **OR** acetaminophen, albuterol, hydrALAZINE, ondansetron **OR** ondansetron (ZOFRAN) IV, oxyCODONE   Vital Signs    Vitals:   07/13/23 0315 07/13/23 0414 07/13/23 0544 07/13/23 0815  BP: 137/85 126/84 134/81 (!) 160/112  Pulse: 100 98 97 97  Resp: 18 20 19    Temp: 98.5 F (36.9 C)     TempSrc: Oral     SpO2: 100% 100% 100%   Weight: 58.5 kg     Height:        Intake/Output Summary (Last 24 hours) at 07/13/2023 1103 Last data filed at 07/13/2023 0717 Gross per 24 hour  Intake 510 ml  Output 501 ml  Net 9 ml      07/13/2023    3:15 AM 07/12/2023    5:07 PM 07/11/2023   11:15 PM  Last 3 Weights  Weight (lbs) 128 lb 15.5 oz 125 lb 14.1 oz 155 lb  Weight (kg) 58.5 kg 57.1 kg 70.308 kg      Telemetry    Sinus rhythm- Personally Reviewed  ECG    No new - Personally Reviewed  Physical Exam   Vitals:   07/13/23 0544 07/13/23 0815  BP: 134/81 (!) 160/112  Pulse: 97 97  Resp: 19   Temp:    SpO2: 100%     GEN: No acute distress.  Dry MMM Neck: No JVD Cardiac: RRR, no murmurs, rubs, or gallops.  Respiratory: Clear to auscultation bilaterally. GI: Soft, nontender, non-distended  MS: No edema; No deformity. Neuro:  Nonfocal  Psych: Normal affect   Labs    High Sensitivity Troponin:   Recent Labs  Lab 07/11/23 2058 07/11/23 2314 07/12/23 0432 07/12/23 1208   TROPONINIHS 251* 225* 221* 199*     Chemistry Recent Labs  Lab 07/11/23 2058 07/11/23 2314 07/12/23 0432 07/13/23 0311  NA 132*  --  130* 131*  K 3.0*  --  2.8* 4.8  CL 97*  --  93* 96*  CO2 23  --  25 24  GLUCOSE 88  --  110* 121*  BUN 12  --  10 20  CREATININE 0.72  --  0.70 1.34*  CALCIUM 9.0  --  9.0 9.1  MG  --   --  1.6* 1.8  PROT  --  9.7* 10.2* 9.3*  ALBUMIN  --  3.0* 3.1* 2.9*  AST  --  202* 193* 130*  ALT  --  108* 109* 84*  ALKPHOS  --  75 75 65  BILITOT  --  0.7 0.8 0.8  GFRNONAA >60  --  >60 52*  ANIONGAP 12  --  12 11    Lipids  Recent Labs  Lab 07/12/23 0432  CHOL 195  TRIG 202*  HDL 24*  LDLCALC 131*  CHOLHDL 8.1    Hematology Recent Labs  Lab 07/11/23 2058 07/12/23  4782 07/12/23 1208 07/13/23 0311  WBC 3.7* 5.2  --  5.7  RBC 4.00 4.19 3.87 4.10  HGB 8.5* 9.1*  --  8.9*  HCT 30.1* 30.9*  --  30.2*  MCV 75.3* 73.7*  --  73.7*  MCH 21.3* 21.7*  --  21.7*  MCHC 28.2* 29.4*  --  29.5*  RDW 18.6* 18.8*  --  18.7*  PLT 296 335  --  343   Thyroid  Recent Labs  Lab 07/12/23 0432  TSH 5.420*    BNP Recent Labs  Lab 07/12/23 0432  BNP 1,017.3*    DDimer No results for input(s): "DDIMER" in the last 168 hours.   Radiology    ECHOCARDIOGRAM COMPLETE  Result Date: 07/12/2023    ECHOCARDIOGRAM REPORT   Patient Name:   S[place] M Flenner Date of Exam: 07/12/2023 Medical Rec #:  [id number]       Height:       59.0 in Accession #:    [id number]      Weight:       155.0 lb Date of Birth:  1986-04-30       BSA:          1.655 m Patient Age:    37 years        BP:           142/115 mmHg Patient Gender: F               HR:           96 bpm. Exam Location:  Inpatient Procedure: 2D Echo, Color Doppler, Cardiac Doppler and Intracardiac            Opacification Agent Indications:    CHF  History:        Patient has no prior history of Echocardiogram examinations.                 Risk Factors:Hypertension.  Sonographer:    Milbert Coulter Referring Phys:  [id number] SARA-MAIZ A THOMAS IMPRESSIONS  1. Left ventricular ejection fraction, by estimation, is 60 to 65%. The left ventricle has normal function. The left ventricle has no regional wall motion abnormalities. There is severe concentric left ventricular hypertrophy. Left ventricular diastolic  parameters are consistent with Grade II diastolic dysfunction (pseudonormalization).  2. Right ventricular systolic function is normal. The right ventricular size is normal.  3. The mitral valve is grossly normal. Mild mitral valve regurgitation.  4. Tricuspid valve regurgitation is mild to moderate.  5. The aortic valve is grossly normal. Aortic valve regurgitation is not visualized. Conclusion(s)/Recommendation(s): Signs of hypertensive heart disease, elevated LVEDP. FINDINGS  Left Ventricle: Left ventricular ejection fraction, by estimation, is 60 to 65%. The left ventricle has normal function. The left ventricle has no regional wall motion abnormalities. Definity contrast agent was given IV to delineate the left ventricular  endocardial borders. The left ventricular internal cavity size was normal in size. There is severe concentric left ventricular hypertrophy. Left ventricular diastolic parameters are consistent with Grade II diastolic dysfunction (pseudonormalization). Right Ventricle: The right ventricular size is normal. Right ventricular systolic function is normal. Left Atrium: Left atrial size was normal in size. Right Atrium: Right atrial size was normal in size. Pericardium: There is no evidence of pericardial effusion. Mitral Valve: The mitral valve is grossly normal. Mild mitral valve regurgitation. Tricuspid Valve: Tricuspid valve regurgitation is mild to moderate. Aortic Valve: The aortic valve is grossly normal. Aortic valve regurgitation is not visualized. Aortic valve mean  gradient measures 3.0 mmHg. Aortic valve peak gradient measures 6.2 mmHg. Aortic valve area, by VTI measures 1.99 cm. Pulmonic Valve:  Pulmonic valve regurgitation is not visualized. Aorta: The aortic root and ascending aorta are structurally normal, with no evidence of dilitation. IAS/Shunts: No atrial level shunt detected by color flow Doppler.  LEFT VENTRICLE PLAX 2D LVIDd:         3.80 cm   Diastology LVIDs:         2.50 cm   LV e' medial:    6.53 cm/s LV PW:         1.50 cm   LV E/e' medial:  14.0 LV IVS:        1.30 cm   LV e' lateral:   6.53 cm/s LVOT diam:     1.70 cm   LV E/e' lateral: 14.0 LV SV:         42 LV SV Index:   25 LVOT Area:     2.27 cm  RIGHT VENTRICLE RV Basal diam:  2.80 cm RV Mid diam:    2.00 cm RV S prime:     11.00 cm/s TAPSE (M-mode): 1.8 cm LEFT ATRIUM             Index        RIGHT ATRIUM           Index LA diam:        3.50 cm 2.11 cm/m   RA Area:     11.90 cm LA Vol (A2C):   50.5 ml 30.51 ml/m  RA Volume:   24.20 ml  14.62 ml/m LA Vol (A4C):   38.9 ml 23.50 ml/m LA Biplane Vol: 46.1 ml 27.85 ml/m  AORTIC VALVE AV Area (Vmax):    2.01 cm AV Area (Vmean):   1.84 cm AV Area (VTI):     1.99 cm AV Vmax:           124.00 cm/s AV Vmean:          84.100 cm/s AV VTI:            0.209 m AV Peak Grad:      6.2 mmHg AV Mean Grad:      3.0 mmHg LVOT Vmax:         110.00 cm/s LVOT Vmean:        68.100 cm/s LVOT VTI:          0.183 m LVOT/AV VTI ratio: 0.88  AORTA Ao Root diam: 2.50 cm Ao Asc diam:  2.80 cm MITRAL VALVE               TRICUSPID VALVE MV Area (PHT): 6.27 cm    TR Peak grad:   44.6 mmHg MV Decel Time: 121 msec    TR Vmax:        334.00 cm/s MV E velocity: 91.20 cm/s MV A velocity: 58.20 cm/s  SHUNTS MV E/A ratio:  1.57        Systemic VTI:  0.18 m                            Systemic Diam: 1.70 cm Carolan Clines Electronically signed by Carolan Clines Signature Date/Time: 07/12/2023/9:04:55 AM    Final    MR BRAIN WO CONTRAST  Result Date: 07/12/2023 CLINICAL DATA:  37 year old female with cerebral palsy. Headache, dizziness, tunnel vision, right arm paresthesia. TIA. EXAM: MRI HEAD WITHOUT CONTRAST TECHNIQUE:  Multiplanar, multiecho pulse  sequences of the brain and surrounding structures were obtained without intravenous contrast. COMPARISON:  CTA head and neck, head CT earlier today. Brain MRI 12/21/2016. FINDINGS: Brain: No restricted diffusion to suggest acute infarction. No midline shift, mass effect, evidence of mass lesion, ventriculomegaly, extra-axial collection or acute intracranial hemorrhage. Cervicomedullary junction and pituitary are within normal limits. Chronic dysplastic appearance of the occipital horns (series 11, image 32) is stable and compatible with neonatal periventricular leukomalacia. But patchy bilateral periventricular white matter T2 and FLAIR hyperintensity appears mildly progressed since 2018 (near the right frontal horn series 11, image 32) and is nonspecific. Chronic volume loss in the posterior corpus callosum unchanged. And no temporal lobe white matter involvement. No superimposed cortical encephalomalacia or convincing chronic cerebral blood products. Deep gray matter nuclei, brainstem and cerebellum remain within normal limits. Vascular: Major intracranial vascular flow voids are stable. Skull and upper cervical spine: Negative. Visualized bone marrow signal is within normal limits. Sinuses/Orbits: Postoperative changes to the right globe since 2018. Paranasal sinuses and mastoids are stable and well aerated. Other: Grossly normal visible internal auditory structures. Negative visible scalp and face. IMPRESSION: 1. No acute intracranial abnormality. 2. Sequelae of neonatal periventricular leukomalacia. Mildly progressed but nonspecific additional periventricular white matter signal changes since a 2018 MRI. Electronically Signed   By: Odessa Fleming M.D.   On: 07/12/2023 06:20   CT ANGIO HEAD NECK W WO CM  Result Date: 07/12/2023 CLINICAL DATA:  37 year old female with neurologic deficit. EXAM: CT ANGIOGRAPHY HEAD AND NECK TECHNIQUE: Multidetector CT imaging of the head and neck was  performed using the standard protocol during bolus administration of intravenous contrast. Multiplanar CT image reconstructions and MIPs were obtained to evaluate the vascular anatomy. Carotid stenosis measurements (when applicable) are obtained utilizing NASCET criteria, using the distal internal carotid diameter as the denominator. RADIATION DOSE REDUCTION: This exam was performed according to the departmental dose-optimization program which includes automated exposure control, adjustment of the mA and/or kV according to patient size and/or use of iterative reconstruction technique. CONTRAST:  60mL OMNIPAQUE IOHEXOL 350 MG/ML SOLN COMPARISON:  Head CT 0051 hours today. Brain MRI 12/21/2016. CTA chest 0053 hours today. FINDINGS: CTA NECK Skeleton: No acute osseous abnormality identified. Upper chest: Stable to the earlier CTA. Other neck: Negative neck soft tissues. Postoperative changes to the right globe. Aortic arch: Mildly bovine arch configuration with no arch atherosclerosis. Right carotid system: Negative aside from mild tortuosity. Left carotid system: Bovine left CCA origin with no plaque or stenosis. No atherosclerosis or stenosis in the neck. Vertebral arteries: Normal proximal right subclavian artery and right vertebral artery origin. Right vertebral artery appears mildly dominant, patent and normal to the skull base. Proximal left subclavian artery and left vertebral artery origin are normal. Fairly normal size left vertebral artery is patent and normal to the skull base. CTA HEAD Posterior circulation: Normal distal vertebral arteries, PICA origins, vertebrobasilar junction. Mildly dominant right V4. Patent basilar artery without stenosis. Normal SCA and PCA origins. Posterior communicating arteries are diminutive or absent. Bilateral PCA branches are within normal limits. Anterior circulation: Both ICA siphons are patent and appear normal. Patent carotid termini. Normal MCA and ACA origins. Azygous  type ACA A2 anatomy. But otherwise the ACA branches are within normal limits. MCA M1 segments and MCA bi/trifurcations appear normal. Bilateral MCA branches are within normal limits. Venous sinuses: Normal. Anatomic variants: Bovine aortic arch configuration. Azygous ACA A2 anatomy. Mildly dominant right vertebral artery. Review of the MIP images confirms the above  findings IMPRESSION: Negative CTA Head and Neck. Electronically Signed   By: Odessa Fleming M.D.   On: 07/12/2023 05:13   CT Angio Chest/Abd/Pel for Dissection W and/or Wo Contrast  Result Date: 07/12/2023 CLINICAL DATA:  Acute aortic syndrome suspected. Chest pain, palpitations, and dizziness starting this morning. Hand pain and color changes. EXAM: CT ANGIOGRAPHY CHEST, ABDOMEN AND PELVIS TECHNIQUE: Non-contrast CT of the chest was initially obtained. Multidetector CT imaging through the chest, abdomen and pelvis was performed using the standard protocol during bolus administration of intravenous contrast. Multiplanar reconstructed images and MIPs were obtained and reviewed to evaluate the vascular anatomy. RADIATION DOSE REDUCTION: This exam was performed according to the departmental dose-optimization program which includes automated exposure control, adjustment of the mA and/or kV according to patient size and/or use of iterative reconstruction technique. CONTRAST:  75mL OMNIPAQUE IOHEXOL 350 MG/ML SOLN COMPARISON:  CT chest 03/19/2014.  CT abdomen and pelvis 01/02/2022 FINDINGS: CTA CHEST FINDINGS Cardiovascular: Unenhanced images of the chest demonstrate no significant aortic calcification. No evidence of acute intramural hematoma. Images obtained during arterial phase after intravenous contrast material administration demonstrate normal caliber thoracic aorta. No aortic dissection. Great vessel origins are patent. Cardiac enlargement. Small pericardial effusion. Central pulmonary arteries are well opacified without evidence of any significant acute  pulmonary embolus. Great vessel origins are patent. Mediastinum/Nodes: Thyroid gland is unremarkable. Esophagus is decompressed. No significant mediastinal lymphadenopathy. Mild prominence of axillary lymph nodes bilaterally, largest measuring up to about 8 mm short axis dimension. Similar appearance to prior study, likely reactive. Lungs/Pleura: Motion artifact limits examination. Hazy infiltrates demonstrated in the lung bases possibly representing edema or pneumonia. No pleural effusions. No pneumothorax. Musculoskeletal: No chest wall abnormality. No acute or significant osseous findings. Review of the MIP images confirms the above findings. CTA ABDOMEN AND PELVIS FINDINGS VASCULAR Aorta: Normal caliber aorta without aneurysm, dissection, vasculitis or significant stenosis. Celiac: Patent without evidence of aneurysm, dissection, vasculitis or significant stenosis. SMA: Patent without evidence of aneurysm, dissection, vasculitis or significant stenosis. Renals: Both renal arteries are patent without evidence of aneurysm, dissection, vasculitis, fibromuscular dysplasia or significant stenosis. IMA: Patent without evidence of aneurysm, dissection, vasculitis or significant stenosis. Inflow: Patent without evidence of aneurysm, dissection, vasculitis or significant stenosis. Veins: No obvious venous abnormality within the limitations of this arterial phase study. Review of the MIP images confirms the above findings. NON-VASCULAR Hepatobiliary: No focal liver abnormality is seen. No gallstones, gallbladder wall thickening, or biliary dilatation. Pancreas: Unremarkable. No pancreatic ductal dilatation or surrounding inflammatory changes. Spleen: Normal in size without focal abnormality. Adrenals/Urinary Tract: Adrenal glands are unremarkable. Kidneys are normal, without renal calculi, focal lesion, or hydronephrosis. Bladder is unremarkable. Stomach/Bowel: Stomach, small bowel, and colon are not abnormally distended.  No wall thickening or inflammatory changes. Appendix is normal. Lymphatic: No significant lymphadenopathy. Reproductive: Uterus and ovaries are not enlarged. Tubal ligation clips. Other: No free air or free fluid in the abdomen. Abdominal wall musculature appears intact. Musculoskeletal: No acute or significant osseous findings. Review of the MIP images confirms the above findings. IMPRESSION: 1. No evidence of aneurysm or dissection involving the thoracic or abdominal aorta. 2. Cardiac enlargement. 3. Hazy infiltrates in the lung bases may represent pneumonia or edema. 4. No acute process suggested in the abdomen or pelvis. 5. Aortic atherosclerosis. Electronically Signed   By: Burman Nieves M.D.   On: 07/12/2023 01:28   CT Head Wo Contrast  Result Date: 07/12/2023 CLINICAL DATA:  Acute neurologic deficit EXAM: CT HEAD WITHOUT  CONTRAST TECHNIQUE: Contiguous axial images were obtained from the base of the skull through the vertex without intravenous contrast. RADIATION DOSE REDUCTION: This exam was performed according to the departmental dose-optimization program which includes automated exposure control, adjustment of the mA and/or kV according to patient size and/or use of iterative reconstruction technique. 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 No mass, hemorrhage or extra-axial collection. Vascular: No hyperdense vessel or unexpected calcification. Skull: Normal Sinuses/Orbits: Paranasal sinuses are clear. No mastoid effusion. Right ocular lens replacement and scleral band. Other: None IMPRESSION: 1. No acute intracranial abnormality. 2. Unchanged configuration of the lateral ventricles with posterior hemispheric white matter loss. Electronically Signed   By: Deatra Robinson M.D.   On: 07/12/2023 01:08   DG Chest 2 View  Result Date: 07/11/2023 CLINICAL DATA:  Chest pain EXAM: CHEST - 2 VIEW COMPARISON:   Chest radiograph 10/01/2021 FINDINGS: The heart size and mediastinal contours are within normal limits. Both lungs are clear. The visualized skeletal structures are unremarkable. IMPRESSION: No active cardiopulmonary disease. Electronically Signed   By: Annia Belt M.D.   On: 07/11/2023 21:17    Cardiac Studies  TTE 07/12/2023 1. Left ventricular ejection fraction, by estimation, is 60 to 65%. The  left ventricle has normal function. The left ventricle has no regional  wall motion abnormalities. There is severe concentric left ventricular  hypertrophy. Left ventricular diastolic   parameters are consistent with Grade II diastolic dysfunction  (pseudonormalization).   2. Right ventricular systolic function is normal. The right ventricular size is normal.   3. The mitral valve is grossly normal. Mild mitral valve regurgitation.   4. Tricuspid valve regurgitation is mild to moderate.   5. The aortic valve is grossly normal. Aortic valve regurgitation is not  visualized.   Conclusion(s)/Recommendation(s): Signs of hypertensive heart disease,  elevated LVEDP.   Patient Profile     37 year old woman with PMH cerebral palsy, seizure disorder, hypertension who presented with acute right sided chest pain, found to have hypertensive emergency after being out of medication. Initial BP 221/139. HsTn elevated but flat. Echo with severe LVH, preserved LV function, grade 2 diastolic dysfunction.   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 -Blood pressures are still significantly elevated -Increasing her spironolactone to 50 mg daily -Increasing her carvedilol to 25 mg twice daily -Continue BiDil -Continue IV hydralazine as needed -She is otherwise euvolemic to dry -Once her blood pressures are closer to at least 140/90 mmHg she can be prepared for discharge  AKI Encourage fluids   Time Spent Directly with Patient:  I have spent a total of 35 minutes with  the patient reviewing hospital notes, telemetry, EKGs, labs and examining the patient as well as establishing an assessment and plan that was discussed personally with the patient.     For questions or updates, please contact [facility] Please consult [email or web address] for contact info under        Signed, Maisie Fus, MD  07/13/2023, 11:03 AM

## 2023-07-13 NOTE — TOC Initial Note (Signed)
Transition of Care [facility]) - Initial/Assessment Note   Patient Details  Name: Dana Parks MRN: [id number] Date of Birth: 02-11-1986  Transition of Care [facility]) CM/SW Contact:    Dana Kelp, LCSW Phone Number: 07/13/2023, 10:19 AM  Clinical Narrative:                 CSW followed-up disposition provider recommendations (SNF placement).  CSW completed initial TOC work-up/assessment as noted by the following below.   CSW met met with the patient at the bedside to review SNF referral process per clinical recommendations and assess pt's SNF preference.   The patient expressed no SN facility preference, but wanted somewhere close to home address.   The CSW contacted the patient's natural support Dana Parks / [phone]) to review the patient's disposition, but was unable to leave a voicemail due to the voice-mailbox being full.   CSW updated the bedside nurse and additional clinical members to the information above regarding SNF initial efforts for placement.    CSW referral efforts to support the patient's disposition FL2: completed  PASRR: completed  SNF referrals: completed   TOC Disposition follow-up needs  Please provide the patient or natural support with bed-offer updates.  Please continue with SNF placement efforts. Please update the clinical team to SNF placement efforts:  No other needs identified by this Clinical research associate currently. Patient needs and current disposition to be followed by    Expected Discharge Plan: Skilled Nursing Facility Barriers to Discharge: Continued Medical Work up   Patient Goals and CMS Choice Patient states their goals for this hospitalization and ongoing recovery are:: Return to baseline functioning   Choice offered to / list presented to : Patient  Expected Discharge Plan and Services In-house Referral: Clinical Social Work   Post Acute Care Choice: Skilled Nursing Facility Living arrangements for the past 2 months: Apartment                   Prior Living Arrangements/Services Living arrangements for the past 2 months: Apartment Lives with:: Self Patient language and need for interpreter reviewed:: Yes Do you feel safe going back to the place where you live?: Yes      Need for Family Participation in Patient Care: Yes (Comment) Care giver support system in place?: Yes (comment)     Activities of Daily Living   ADL Screening (condition at time of admission) Independently performs ADLs?: No Does the patient have a NEW difficulty with bathing/dressing/toileting/self-feeding that is expected to last >3 days?: No Does the patient have a NEW difficulty with getting in/out of bed, walking, or climbing stairs that is expected to last >3 days?: No Does the patient have a NEW difficulty with communication that is expected to last >3 days?: No Is the patient deaf or have difficulty hearing?: No Does the patient have difficulty seeing, even when wearing glasses/contacts?: No Does the patient have difficulty concentrating, remembering, or making decisions?: No  Permission Sought/Granted Permission sought to share information with : Case Manager, Magazine features editor Permission granted to share information with : Yes, Release of Information Signed  Emotional Assessment   Orientation: : Oriented to Self, Oriented to Place, Oriented to  Time, Oriented to Situation   Psych Involvement: No (comment)  Admission diagnosis:  Hypertensive urgency [I16.0] Hypertensive emergency [I16.1] Acute congestive heart failure, unspecified heart failure type [facility]) [I50.9] Patient Active Problem List   Diagnosis Date Noted   Hypertensive emergency 07/12/2023   Hypertensive urgency 07/12/2023   Pain due  to onychomycosis of toenails of both feet 06/07/2023   Intractable migraine with aura with status migrainosus 01/08/2017   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   Asthma, mild intermittent, well-controlled 12/13/2014   Low lying placenta  without hemorrhage, antepartum    Epilepsy affecting pregnancy, 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   Asthma complicating pregnancy, antepartum 09/09/2014   Cerebral palsy (HCC)    Seizure disorder (HCC)    PCP:  Dana Contras, MD Pharmacy:   [facility] [phone] - [place], [place] - [address] AT [facility] [address] [place] [address] Phone: [phone] Fax: [phone]   Social Determinants of Health (SDOH) Social History: SDOH Screenings   Food Insecurity: No Food Insecurity (07/12/2023)  Housing: Low Risk  (07/12/2023)  Transportation Needs: No Transportation Needs (07/12/2023)  Utilities: Not At Risk (07/12/2023)  Tobacco Use: Medium Risk (07/11/2023)   SDOH Interventions:     Readmission Risk Interventions     No data to display

## 2023-07-13 NOTE — NC FL2 (Signed)
[place] MEDICAID FL2 LEVEL OF CARE FORM     IDENTIFICATION  Patient Name: Dana Parks Birthdate: 12-Jul-1986 Sex: female Admission Date (Current Location): 07/11/2023  [place] and [place] Number:  [name] [id number] Facility and Address:  [facility], [address] [place], [place] [address]      Provider Number: [id number]  Attending Physician Name and Address:  Leatha Gilding, MD  Relative Name and Phone Number:  [name] / [phone]    Current Level of Care: Hospital Recommended Level of Care: Skilled Nursing Facility Prior Approval Number:    Date Approved/Denied: 07/13/23 PASRR Number: [id number]  Discharge Plan: SNF    Current Diagnoses: Patient Active Problem List   Diagnosis Date Noted   Hypertensive emergency 07/12/2023   Hypertensive urgency 07/12/2023   Pain due to onychomycosis of toenails of both feet 06/07/2023   Intractable migraine with aura with status migrainosus 01/08/2017   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   Asthma, mild intermittent, well-controlled 12/13/2014   Low lying placenta without hemorrhage, antepartum    Epilepsy affecting pregnancy, 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   Asthma complicating pregnancy, antepartum 09/09/2014   Cerebral palsy (HCC)    Seizure disorder (HCC)     Orientation RESPIRATION BLADDER Height & Weight     Self, Situation, Place, Time    Incontinent Weight: 128 lb 15.5 oz (58.5 kg) Height:  4\' 11"  (149.9 cm)  BEHAVIORAL SYMPTOMS/MOOD NEUROLOGICAL BOWEL NUTRITION STATUS      Incontinent Diet  AMBULATORY STATUS COMMUNICATION OF NEEDS Skin   Limited Assist Verbally                         Personal Care Assistance Level of Assistance  Bathing, Dressing Bathing Assistance: Limited assistance   Dressing Assistance: Limited assistance     Functional Limitations Info             SPECIAL CARE FACTORS FREQUENCY  PT (By licensed PT), OT (By licensed OT)     PT Frequency:  5x OT Frequency: 3x            Contractures Contractures Info: Not present    Additional Factors Info  Code Status Code Status Info: [place] Code             Current Medications (07/13/2023):  This is the current hospital active medication list Current Facility-Administered Medications  Medication Dose Route Frequency Provider Last Rate Last Admin    stroke: early stages of recovery book   Does not apply Once Lurline Del, MD       acetaminophen (TYLENOL) tablet 650 mg  650 mg Oral Q6H PRN Lurline Del, MD   650 mg at 07/12/23 0946   Or   acetaminophen (TYLENOL) suppository 650 mg  650 mg Rectal Q6H PRN Lurline Del, MD       albuterol (PROVENTIL) (2.5 MG/3ML) 0.083% nebulizer solution 2.5 mg  2.5 mg Nebulization Q2H PRN Skip Mayer A, MD       carvedilol (COREG) tablet 12.5 mg  12.5 mg Oral BID WC Skip Mayer A, MD   12.5 mg at 07/13/23 0815   heparin injection 5,000 Units  5,000 Units Subcutaneous Q8H Skip Mayer A, MD   5,000 Units at 07/13/23 0604   hydrALAZINE (APRESOLINE) injection 20 mg  20 mg Intravenous Q6H PRN Lurline Del, MD       isosorbide-hydrALAZINE (BIDIL) 20-37.5 MG per tablet 2 tablet  2 tablet Oral TID Skip Mayer  A, MD   2 tablet at 07/13/23 0815   levETIRAcetam (KEPPRA) tablet 1,000 mg  1,000 mg Oral BID Skip Mayer A, MD   1,000 mg at 07/13/23 0815   ondansetron (ZOFRAN) tablet 4 mg  4 mg Oral Q6H PRN Lurline Del, MD       Or   ondansetron [facility]) injection 4 mg  4 mg Intravenous Q6H PRN Lurline Del, MD   4 mg at 07/13/23 6578   oxyCODONE (Oxy IR/ROXICODONE) immediate release tablet 5 mg  5 mg Oral Q4H PRN Lurline Del, MD   5 mg at 07/13/23 0111   spironolactone (ALDACTONE) tablet 25 mg  25 mg Oral Daily Jonita Albee, PA-C   25 mg at 07/13/23 4696     Discharge Medications: Please see discharge summary for a list of discharge medications.  Relevant Imaging  Results:  Relevant Lab Results:   Additional Information SS#: [id number]  Helene Kelp, LCSW

## 2023-07-13 NOTE — Progress Notes (Signed)
PROGRESS NOTE  Dana Parks:[id number] DOB: 1985-11-14 DOA: 07/11/2023 PCP: Fleet Contras, MD   LOS: 1 day   Brief Narrative / Interim history: 37 year old female with cerebral palsy with right-sided weakness, seizure disorder, asthma, anemia, HTN who comes into the hospital with chest pain as well as dizziness.  This happened the day prior to admission.  She reports history of elevated blood pressure however she is in between doctors, just establish care and has run out of all her medications.  She told me she recently went to urgent care and was prescribed amlodipine 5 mg, however felt like this was not enough and she used to take more in the past.  In the ED she was found to have some evidence of fluid overload based on the CT scan of the chest, was significantly hypertensive, and she was admitted to the hospital  Subjective / 24h Interval events: Complains of nausea after getting the morning meds  Assesement and Plan: Principal problem Hypertensive emergency-patient was admitted to the hospital with significantly elevated blood pressure and some hazy infiltrates on the lung CT possibly representing edema.  BNP is significantly elevated, along with the troponins.  She also has LFT elevation.  Patient was started on Coreg, furosemide as well as BiDil and blood pressure looks improved today.  Goal BP around 150-160s systolic for now -CT and MRI of the brain without acute findings  Active problems Elevated troponin-likely in the setting of hypertensive emergency, flat, not in a pattern consistent with ACS.  Chest pain is improving with improvement in her blood pressure.  2D echo showed normal LVEF, severe LVH and grade 2 diastolic dysfunction.  RV was normal  AKI-creatinine up to 1.3 today due to Lasix.  Discontinue  Acute on chronic diastolic CHF-with some evidence of fluid overload on admission, received Lasix, now holding due to slightly worse creatinine  Hypokalemia,  hypomagnesemia-replace and continue to monitor  Hyponatremia-monitor sodium, may have slight fluid overload  Elevated LFTs-possibly due to #1.  Hold statin for now.  Improving.  Hepatitis panel negative  History of seizures-continue Keppra  Cerebral palsy-noted.  She tells me that she has difficulties ambulating at baseline, has no wheelchair or walker.  Has had occasional falls and when she is down she is very difficult for her to get up.  PT recommends SNF, patient agreeable.  TOC consulted  History of asthma-no wheezing, appears stable  Anemia-anemia panel unremarkable but B12 is on the low side.  Will replete  Scheduled Meds:   stroke: early stages of recovery book   Does not apply Once   carvedilol  12.5 mg Oral BID WC   furosemide  40 mg Intravenous Q12H   heparin  5,000 Units Subcutaneous Q8H   isosorbide-hydrALAZINE  2 tablet Oral TID   levETIRAcetam  1,000 mg Oral BID   spironolactone  25 mg Oral Daily   Continuous Infusions:   PRN Meds:.acetaminophen **OR** acetaminophen, albuterol, hydrALAZINE, ondansetron **OR** ondansetron (ZOFRAN) IV, oxyCODONE  Current Outpatient Medications  Medication Instructions   albuterol (PROVENTIL HFA;VENTOLIN HFA) 108 (90 BASE) MCG/ACT inhaler 2 puffs, Inhalation, Every 6 hours PRN   amLODipine (NORVASC) 5 mg, Oral, Daily at bedtime   budesonide-formoterol (SYMBICORT) 160-4.5 MCG/ACT inhaler 2 puffs, Inhalation, 2 times daily, Rinse mouth with water after each use   fluticasone (FLONASE) 50 MCG/ACT nasal spray 1 spray, Each Nare, 2 times daily   ibuprofen (ADVIL) 800 mg, Oral, Every 8 hours PRN   levETIRAcetam (KEPPRA) 1,000 mg, Oral, 2  times daily    Diet Orders (From admission, onward)     Start     Ordered   07/12/23 0720  Diet Heart Room service appropriate? Yes; Fluid consistency: Thin  Diet effective now       Question Answer Comment  Room service appropriate? Yes   Fluid consistency: Thin      07/12/23 0719             DVT prophylaxis: heparin injection 5,000 Units Start: 07/12/23 0600   Lab Results  Component Value Date   PLT 343 07/13/2023      Code Status: Full Code  Family Communication: n ofamily at bedside   Status is: Inpatient Remains inpatient appropriate because: severity of illness  Level of care: Telemetry Cardiac  Consultants:  none  Objective: Vitals:   07/13/23 0315 07/13/23 0414 07/13/23 0544 07/13/23 0815  BP: 137/85 126/84 134/81 (!) 160/112  Pulse: 100 98 97 97  Resp: 18 20 19    Temp: 98.5 F (36.9 C)     TempSrc: Oral     SpO2: 100% 100% 100%   Weight: 58.5 kg     Height:        Intake/Output Summary (Last 24 hours) at 07/13/2023 1610 Last data filed at 07/13/2023 9604 Gross per 24 hour  Intake 510 ml  Output 501 ml  Net 9 ml   Wt Readings from Last 3 Encounters:  07/13/23 58.5 kg  03/08/22 68.3 kg  01/02/22 70.3 kg    Examination:  Constitutional: NAD Eyes: lids and conjunctivae normal, no scleral icterus ENMT: mmm Neck: normal, supple Respiratory: clear to auscultation bilaterally, no wheezing, no crackles. Normal respiratory effort.  Cardiovascular: Regular rate and rhythm, no murmurs / rubs / gallops. No LE edema. Abdomen: soft, no distention, no tenderness. Bowel sounds positive.   Data Reviewed: I have independently reviewed following labs and imaging studies   CBC Recent Labs  Lab 07/11/23 2058 07/12/23 0432 07/13/23 0311  WBC 3.7* 5.2 5.7  HGB 8.5* 9.1* 8.9*  HCT 30.1* 30.9* 30.2*  PLT 296 335 343  MCV 75.3* 73.7* 73.7*  MCH 21.3* 21.7* 21.7*  MCHC 28.2* 29.4* 29.5*  RDW 18.6* 18.8* 18.7*    Recent Labs  Lab 07/11/23 2058 07/11/23 2314 07/12/23 0432 07/13/23 0311  NA 132*  --  130* 131*  K 3.0*  --  2.8* 4.8  CL 97*  --  93* 96*  CO2 23  --  25 24  GLUCOSE 88  --  110* 121*  BUN 12  --  10 20  CREATININE 0.72  --  0.70 1.34*  CALCIUM 9.0  --  9.0 9.1  AST  --  202* 193* 130*  ALT  --  108* 109* 84*  ALKPHOS   --  75 75 65  BILITOT  --  0.7 0.8 0.8  ALBUMIN  --  3.0* 3.1* 2.9*  MG  --   --  1.6* 1.8  TSH  --   --  5.420*  --   HGBA1C  --   --  5.1  --   BNP  --   --  1,017.3*  --     ------------------------------------------------------------------------------------------------------------------ Recent Labs    07/12/23 0432  CHOL 195  HDL 24*  LDLCALC 131*  TRIG 202*  CHOLHDL 8.1    Lab Results  Component Value Date   HGBA1C 5.1 07/12/2023   ------------------------------------------------------------------------------------------------------------------ Recent Labs    07/12/23 0432  TSH 5.420*    Cardiac  Enzymes No results for input(s): "CKMB", "TROPONINI", "MYOGLOBIN" in the last 168 hours.  Invalid input(s): "CK" ------------------------------------------------------------------------------------------------------------------    Component Value Date/Time   BNP 1,017.3 (H) 07/12/2023 0432    CBG: Recent Labs  Lab 07/12/23 0805 07/13/23 0752  GLUCAP 93 123*    No results found for this or any previous visit (from the past 240 hour(s)).   Radiology Studies: No results found.   Pamella Pert, MD, PhD [facility]  Between 7 am - 7 pm I am available, please contact me via Amion (for emergencies) or Securechat (non urgent messages)  Between 7 pm - 7 am I am not available, please contact night coverage MD/APP via Amion

## 2023-07-14 ENCOUNTER — Inpatient Hospital Stay (HOSPITAL_COMMUNITY): Payer: 59

## 2023-07-14 DIAGNOSIS — I161 Hypertensive emergency: Secondary | ICD-10-CM | POA: Diagnosis not present

## 2023-07-14 LAB — GLUCOSE, CAPILLARY
Glucose-Capillary: 87 mg/dL (ref 70–99)
Glucose-Capillary: 90 mg/dL (ref 70–99)
Glucose-Capillary: 103 mg/dL — ABNORMAL HIGH (ref 70–99)
Glucose-Capillary: 104 mg/dL — ABNORMAL HIGH (ref 70–99)

## 2023-07-14 LAB — COMPREHENSIVE METABOLIC PANEL
ALT: 80 U/L — ABNORMAL HIGH (ref 0–44)
AST: 113 U/L — ABNORMAL HIGH (ref 15–41)
Albumin: 3.1 g/dL — ABNORMAL LOW (ref 3.5–5.0)
Alkaline Phosphatase: 68 U/L (ref 38–126)
Anion gap: 13 (ref 5–15)
BUN: 24 mg/dL — ABNORMAL HIGH (ref 6–20)
CO2: 24 mmol/L (ref 22–32)
Calcium: 9.5 mg/dL (ref 8.9–10.3)
Chloride: 95 mmol/L — ABNORMAL LOW (ref 98–111)
Creatinine, Ser: 1 mg/dL (ref 0.44–1.00)
GFR, Estimated: >60 mL/min (ref >60)
Glucose, Bld: 102 mg/dL — ABNORMAL HIGH (ref 70–99)
Potassium: 4.2 mmol/L (ref 3.5–5.1)
Sodium: 132 mmol/L — ABNORMAL LOW (ref 135–145)
Total Bilirubin: 1 mg/dL (ref <1.2)
Total Protein: 10 g/dL — ABNORMAL HIGH (ref 6.5–8.1)

## 2023-07-14 LAB — CBC
HCT: 32.9 % — ABNORMAL LOW (ref 36.0–46.0)
Hemoglobin: 9.5 g/dL — ABNORMAL LOW (ref 12.0–15.0)
MCH: 21.7 pg — ABNORMAL LOW (ref 26.0–34.0)
MCHC: 28.9 g/dL — ABNORMAL LOW (ref 30.0–36.0)
MCV: 75.1 fL — ABNORMAL LOW (ref 80.0–100.0)
Platelets: 370 10*3/uL (ref 150–400)
RBC: 4.38 MIL/uL (ref 3.87–5.11)
RDW: 18.9 % — ABNORMAL HIGH (ref 11.5–15.5)
WBC: 5.7 10*3/uL (ref 4.0–10.5)
nRBC: 0 % (ref 0.0–0.2)

## 2023-07-14 LAB — MAGNESIUM: Magnesium: 2.1 mg/dL (ref 1.7–2.4)

## 2023-07-14 MED ORDER — FERROUS SULFATE 325 (65 FE) MG PO TABS
325.0000 mg | ORAL_TABLET | Freq: Every day | ORAL | Status: DC
  Administered 2023-07-15 – 2023-07-18 (×4): 325 mg via ORAL
  Filled 2023-07-14 (×4): qty 1

## 2023-07-14 MED ORDER — LOSARTAN POTASSIUM 50 MG PO TABS
50.0000 mg | ORAL_TABLET | Freq: Every day | ORAL | Status: DC
  Administered 2023-07-14 – 2023-07-18 (×5): 50 mg via ORAL
  Filled 2023-07-14 (×5): qty 1

## 2023-07-14 MED ORDER — SENNOSIDES-DOCUSATE SODIUM 8.6-50 MG PO TABS
1.0000 | ORAL_TABLET | Freq: Two times a day (BID) | ORAL | Status: DC
  Administered 2023-07-14 – 2023-07-18 (×8): 1 via ORAL
  Filled 2023-07-14 (×8): qty 1

## 2023-07-14 MED ORDER — POLYETHYLENE GLYCOL 3350 17 G PO PACK
17.0000 g | PACK | Freq: Two times a day (BID) | ORAL | Status: DC
  Administered 2023-07-14 – 2023-07-18 (×8): 17 g via ORAL
  Filled 2023-07-14 (×8): qty 1

## 2023-07-14 MED ORDER — VITAMIN B-12 1000 MCG PO TABS
1000.0000 ug | ORAL_TABLET | Freq: Every day | ORAL | Status: DC
  Administered 2023-07-14 – 2023-07-18 (×5): 1000 ug via ORAL
  Filled 2023-07-14 (×5): qty 1

## 2023-07-14 MED ORDER — AMLODIPINE BESYLATE 10 MG PO TABS
10.0000 mg | ORAL_TABLET | Freq: Every day | ORAL | Status: DC
  Administered 2023-07-14 – 2023-07-15 (×2): 10 mg via ORAL
  Filled 2023-07-14 (×2): qty 1

## 2023-07-14 NOTE — Progress Notes (Signed)
Rounding Note    Patient Name: Dana Parks Date of Encounter: 07/14/2023  [facility] Cardiologist: Jodelle Red, MD   Subjective   She notes having significant headaches which lead to nausea.  Interval Hx  CT angio head and neck and brain MR have not shown any acute changes. Creatinine normal today. Bps still elevated up to 160s systolic   Inpatient Medications    Scheduled Meds:  carvedilol  25 mg Oral BID WC   heparin  5,000 Units Subcutaneous Q8H   isosorbide-hydrALAZINE  2 tablet Oral TID   levETIRAcetam  1,000 mg Oral BID   spironolactone  50 mg Oral Daily   Continuous Infusions:  PRN Meds: acetaminophen **OR** acetaminophen, albuterol, labetalol, ondansetron **OR** ondansetron (ZOFRAN) IV, oxyCODONE   Vital Signs    Vitals:   07/14/23 0217 07/14/23 0232 07/14/23 0718 07/14/23 0900  BP: (!) 144/98  (!) 159/116 (!) 160/97  Pulse: 99  96 (!) 103  Resp: 20  20 16   Temp: 99.1 F (37.3 C)   97.8 F (36.6 C)  TempSrc: Oral   Oral  SpO2: 100%  100% 100%  Weight:  58.3 kg    Height:        Intake/Output Summary (Last 24 hours) at 07/14/2023 1046 Last data filed at 07/14/2023 0933 Gross per 24 hour  Intake 958 ml  Output 900 ml  Net 58 ml      07/14/2023    2:32 AM 07/13/2023    3:15 AM 07/12/2023    5:07 PM  Last 3 Weights  Weight (lbs) 128 lb 8.5 oz 128 lb 15.5 oz 125 lb 14.1 oz  Weight (kg) 58.3 kg 58.5 kg 57.1 kg      Telemetry    Sinus rhythm- Personally Reviewed  ECG    No new - Personally Reviewed  Physical Exam   Vitals:   07/14/23 0718 07/14/23 0900  BP: (!) 159/116 (!) 160/97  Pulse: 96 (!) 103  Resp: 20 16  Temp:  97.8 F (36.6 C)  SpO2: 100% 100%    GEN: No acute distress.  Dry MMM Neck: No JVD Cardiac: RRR, no murmurs, rubs, or gallops.  Respiratory: Clear to auscultation bilaterally. GI: Soft, nontender, non-distended  MS: No edema; No deformity. Neuro:  Nonfocal  Psych: Normal affect   Labs     High Sensitivity Troponin:   Recent Labs  Lab 07/11/23 2058 07/11/23 2314 07/12/23 0432 07/12/23 1208  TROPONINIHS 251* 225* 221* 199*     Chemistry Recent Labs  Lab 07/12/23 0432 07/13/23 0311 07/14/23 0426  NA 130* 131* 132*  K 2.8* 4.8 4.2  CL 93* 96* 95*  CO2 25 24 24   GLUCOSE 110* 121* 102*  BUN 10 20 24*  CREATININE 0.70 1.34* 1.00  CALCIUM 9.0 9.1 9.5  MG 1.6* 1.8 2.1  PROT 10.2* 9.3* 10.0*  ALBUMIN 3.1* 2.9* 3.1*  AST 193* 130* 113*  ALT 109* 84* 80*  ALKPHOS 75 65 68  BILITOT 0.8 0.8 1.0  GFRNONAA >60 52* >60  ANIONGAP 12 11 13     Lipids  Recent Labs  Lab 07/12/23 0432  CHOL 195  TRIG 202*  HDL 24*  LDLCALC 131*  CHOLHDL 8.1    Hematology Recent Labs  Lab 07/12/23 0432 07/12/23 1208 07/13/23 0311 07/14/23 0426  WBC 5.2  --  5.7 5.7  RBC 4.19 3.87 4.10 4.38  HGB 9.1*  --  8.9* 9.5*  HCT 30.9*  --  30.2* 32.9*  MCV 73.7*  --  73.7* 75.1*  MCH 21.7*  --  21.7* 21.7*  MCHC 29.4*  --  29.5* 28.9*  RDW 18.8*  --  18.7* 18.9*  PLT 335  --  343 370   Thyroid  Recent Labs  Lab 07/12/23 0432  TSH 5.420*    BNP Recent Labs  Lab 07/12/23 0432  BNP 1,017.3*    DDimer No results for input(s): "DDIMER" in the last 168 hours.   Radiology    No results found.  Cardiac Studies  TTE 07/12/2023 1. Left ventricular ejection fraction, by estimation, is 60 to 65%. The  left ventricle has normal function. The left ventricle has no regional  wall motion abnormalities. There is severe concentric left ventricular  hypertrophy. Left ventricular diastolic   parameters are consistent with Grade II diastolic dysfunction  (pseudonormalization).   2. Right ventricular systolic function is normal. The right ventricular size is normal.   3. The mitral valve is grossly normal. Mild mitral valve regurgitation.   4. Tricuspid valve regurgitation is mild to moderate.   5. The aortic valve is grossly normal. Aortic valve regurgitation is not  visualized.    Conclusion(s)/Recommendation(s): Signs of hypertensive heart disease,  elevated LVEDP.   Patient Profile     37 year old woman with PMH cerebral palsy, seizure disorder, hypertension who presented with acute right sided chest pain, found to have hypertensive emergency after being out of medication. Initial BP 221/139. HsTn elevated but flat. Echo with severe LVH, preserved LV function, grade 2 diastolic dysfunction.   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  No adrenal masses to suggest a pheo -Hopefully blood pressure should get closer to 140/90 mmhg, otherwise if she is symptomatically feeling better can plan for discharge from a cardiac perspective  AKI-resolved She is otherwise euvolemic to dry Encourage fluids  Microcytic Anemia Started oral iron  Per primary team Mild Transaminitis Hx of neonatal periventricular leukomalacia    Time Spent Directly with Patient:  I have spent a total of 35 minutes with the patient reviewing hospital notes, telemetry, EKGs, labs and examining the patient as well as establishing an assessment and plan that was discussed personally with the patient.     For questions or updates, please contact  [facility] Please consult [email or web address] for contact info under        Signed, Maisie Fus, MD  07/14/2023, 10:46 AM

## 2023-07-14 NOTE — Plan of Care (Signed)
  Problem: Education: Goal: Knowledge of disease or condition will improve Outcome: Progressing   Problem: Ischemic Stroke/TIA Tissue Perfusion: Goal: Complications of ischemic stroke/TIA will be minimized Outcome: Progressing   

## 2023-07-14 NOTE — Progress Notes (Signed)
PROGRESS NOTE  Dana Parks:[id number] DOB: 1986-02-07 DOA: 07/11/2023 PCP: Fleet Contras, MD   LOS: 2 days   Brief Narrative / Interim history: 36 year old female with cerebral palsy with right-sided weakness, seizure disorder, asthma, anemia, HTN who comes into the hospital with chest pain as well as dizziness.  This happened the day prior to admission.  She reports history of elevated blood pressure however she is in between doctors, just establish care and has run out of all her medications.  She told me she recently went to urgent care and was prescribed amlodipine 5 mg, however felt like this was not enough and she used to take more in the past.  In the ED she was found to have some evidence of fluid overload based on the CT scan of the chest, was significantly hypertensive, and she was admitted to the hospital  Subjective / 24h Interval events: Having a headache  Assesement and Plan: Principal problem Hypertensive emergency-patient was admitted to the hospital with significantly elevated blood pressure and some hazy infiltrates on the lung CT possibly representing edema.  BNP is significantly elevated, along with the troponins.  She also has LFT elevation.  CT and MRI of the brain without acute findings -Cardiology consulted, appreciate input.  Continue to adjust blood pressure medications daily, currently on 10 mg of amlodipine, 25 twice daily carvedilol as well as 50 of losartan 50 of spironolactone  Active problems Elevated troponin-likely in the setting of hypertensive emergency, flat, not in a pattern consistent with ACS.  Chest pain is improving with improvement in her blood pressure.  2D echo showed normal LVEF, severe LVH and grade 2 diastolic dysfunction.  RV was normal  AKI-due to Lasix, creatinine improved with discontinuation of diuretic  Acute on chronic diastolic CHF-with some evidence of fluid overload on admission, status post Lasix.  Currently on  spironolactone  Hypokalemia, hypomagnesemia-replace and continue to monitor  Hyponatremia-monitor sodium, may have slight fluid overload  Elevated LFTs-possibly due to #1.  Hold statin for now.  Improving.  Hepatitis panel negative.  LFTs continue to improve today  History of seizures-continue Keppra  Cerebral palsy-noted.  She tells me that she has difficulties ambulating at baseline, has no wheelchair or walker.  Has had occasional falls and when she is down she is very difficult for her to get up.  PT recommends SNF, patient agreeable.  TOC consulted  History of asthma-no wheezing, appears stable  Anemia-anemia panel unremarkable but B12 is on the low side.  Will replete  Scheduled Meds:  amLODipine  10 mg Oral Daily   carvedilol  25 mg Oral BID WC   vitamin B-12  1,000 mcg Oral Daily   [START ON 07/15/2023] ferrous sulfate  325 mg Oral Q breakfast   heparin  5,000 Units Subcutaneous Q8H   levETIRAcetam  1,000 mg Oral BID   losartan  50 mg Oral Daily   spironolactone  50 mg Oral Daily   Continuous Infusions:   PRN Meds:.acetaminophen **OR** acetaminophen, albuterol, labetalol, ondansetron **OR** ondansetron (ZOFRAN) IV, oxyCODONE  Current Outpatient Medications  Medication Instructions   albuterol (PROVENTIL HFA;VENTOLIN HFA) 108 (90 BASE) MCG/ACT inhaler 2 puffs, Inhalation, Every 6 hours PRN   amLODipine (NORVASC) 5 mg, Oral, Daily at bedtime   budesonide-formoterol (SYMBICORT) 160-4.5 MCG/ACT inhaler 2 puffs, Inhalation, 2 times daily, Rinse mouth with water after each use   fluticasone (FLONASE) 50 MCG/ACT nasal spray 1 spray, Each Nare, 2 times daily   ibuprofen (ADVIL) 800 mg, Oral,  Every 8 hours PRN   levETIRAcetam (KEPPRA) 1,000 mg, Oral, 2 times daily    Diet Orders (From admission, onward)     Start     Ordered   07/13/23 0923  Diet Heart Room service appropriate? Yes with Assist; Fluid consistency: Thin  Diet effective now       Question Answer Comment  Room  service appropriate? Yes with Assist   Fluid consistency: Thin      07/13/23 1610            DVT prophylaxis: heparin injection 5,000 Units Start: 07/12/23 0600   Lab Results  Component Value Date   PLT 370 07/14/2023      Code Status: Full Code  Family Communication: n ofamily at bedside   Status is: Inpatient Remains inpatient appropriate because: severity of illness  Level of care: Telemetry Cardiac  Consultants:  none  Objective: Vitals:   07/14/23 0217 07/14/23 0232 07/14/23 0718 07/14/23 0900  BP: (!) 144/98  (!) 159/116 (!) 160/97  Pulse: 99  96 (!) 103  Resp: 20  20 16   Temp: 99.1 F (37.3 C)   97.8 F (36.6 C)  TempSrc: Oral   Oral  SpO2: 100%  100% 100%  Weight:  58.3 kg    Height:        Intake/Output Summary (Last 24 hours) at 07/14/2023 1145 Last data filed at 07/14/2023 0933 Gross per 24 hour  Intake 958 ml  Output 900 ml  Net 58 ml   Wt Readings from Last 3 Encounters:  07/14/23 58.3 kg  03/08/22 68.3 kg  01/02/22 70.3 kg    Examination:  Constitutional: NAD Eyes: lids and conjunctivae normal, no scleral icterus ENMT: mmm Neck: normal, supple Respiratory: clear to auscultation bilaterally, no wheezing, no crackles. Normal respiratory effort.  Cardiovascular: Regular rate and rhythm, no murmurs / rubs / gallops. No LE edema. Abdomen: soft, no distention, no tenderness. Bowel sounds positive.   Data Reviewed: I have independently reviewed following labs and imaging studies   CBC Recent Labs  Lab 07/11/23 2058 07/12/23 0432 07/13/23 0311 07/14/23 0426  WBC 3.7* 5.2 5.7 5.7  HGB 8.5* 9.1* 8.9* 9.5*  HCT 30.1* 30.9* 30.2* 32.9*  PLT 296 335 343 370  MCV 75.3* 73.7* 73.7* 75.1*  MCH 21.3* 21.7* 21.7* 21.7*  MCHC 28.2* 29.4* 29.5* 28.9*  RDW 18.6* 18.8* 18.7* 18.9*    Recent Labs  Lab 07/11/23 2058 07/11/23 2314 07/12/23 0432 07/13/23 0311 07/14/23 0426  NA 132*  --  130* 131* 132*  K 3.0*  --  2.8* 4.8 4.2  CL 97*   --  93* 96* 95*  CO2 23  --  25 24 24   GLUCOSE 88  --  110* 121* 102*  BUN 12  --  10 20 24*  CREATININE 0.72  --  0.70 1.34* 1.00  CALCIUM 9.0  --  9.0 9.1 9.5  AST  --  202* 193* 130* 113*  ALT  --  108* 109* 84* 80*  ALKPHOS  --  75 75 65 68  BILITOT  --  0.7 0.8 0.8 1.0  ALBUMIN  --  3.0* 3.1* 2.9* 3.1*  MG  --   --  1.6* 1.8 2.1  TSH  --   --  5.420*  --   --   HGBA1C  --   --  5.1  --   --   BNP  --   --  1,017.3*  --   --     ------------------------------------------------------------------------------------------------------------------  Recent Labs    07/12/23 0432  CHOL 195  HDL 24*  LDLCALC 131*  TRIG 202*  CHOLHDL 8.1    Lab Results  Component Value Date   HGBA1C 5.1 07/12/2023   ------------------------------------------------------------------------------------------------------------------ Recent Labs    07/12/23 0432  TSH 5.420*    Cardiac Enzymes No results for input(s): "CKMB", "TROPONINI", "MYOGLOBIN" in the last 168 hours.  Invalid input(s): "CK" ------------------------------------------------------------------------------------------------------------------    Component Value Date/Time   BNP 1,017.3 (H) 07/12/2023 0432    CBG: Recent Labs  Lab 07/12/23 0805 07/13/23 0752 07/13/23 2105 07/14/23 0729  GLUCAP 93 123* 93 87    No results found for this or any previous visit (from the past 240 hour(s)).   Radiology Studies: No results found.   Pamella Pert, MD, PhD [facility]  Between 7 am - 7 pm I am available, please contact me via Amion (for emergencies) or Securechat (non urgent messages)  Between 7 pm - 7 am I am not available, please contact night coverage MD/APP via Amion

## 2023-07-15 DIAGNOSIS — I161 Hypertensive emergency: Secondary | ICD-10-CM | POA: Diagnosis not present

## 2023-07-15 LAB — BASIC METABOLIC PANEL
Anion gap: 11 (ref 5–15)
BUN: 38 mg/dL — ABNORMAL HIGH (ref 6–20)
CO2: 25 mmol/L (ref 22–32)
Calcium: 9.5 mg/dL (ref 8.9–10.3)
Chloride: 96 mmol/L — ABNORMAL LOW (ref 98–111)
Creatinine, Ser: 1.79 mg/dL — ABNORMAL HIGH (ref 0.44–1.00)
GFR, Estimated: 37 mL/min — ABNORMAL LOW (ref >60)
Glucose, Bld: 105 mg/dL — ABNORMAL HIGH (ref 70–99)
Potassium: 5 mmol/L (ref 3.5–5.1)
Sodium: 132 mmol/L — ABNORMAL LOW (ref 135–145)

## 2023-07-15 LAB — GLUCOSE, CAPILLARY
Glucose-Capillary: 83 mg/dL (ref 70–99)
Glucose-Capillary: 93 mg/dL (ref 70–99)
Glucose-Capillary: 84 mg/dL (ref 70–99)

## 2023-07-15 MED ORDER — AMLODIPINE BESYLATE 5 MG PO TABS
5.0000 mg | ORAL_TABLET | Freq: Every day | ORAL | Status: DC
  Administered 2023-07-16 – 2023-07-18 (×3): 5 mg via ORAL
  Filled 2023-07-15 (×3): qty 1

## 2023-07-15 MED ORDER — ORAL CARE MOUTH RINSE: 15.0000 mL | OROMUCOSAL | Status: DC | PRN

## 2023-07-15 NOTE — Progress Notes (Signed)
Rounding Note    Patient Name: Dana Parks Date of Encounter: 07/15/2023  [facility] Cardiologist: Jodelle Red, MD   Subjective   No acute overnight events. BP is much better and is actually soft in the low 100s this morning. She continues to have some atypical right sided chest pain that sounds musculoskeletal in nature (occurs with palpation of chest wall and movement of right arm). No significant shortness of breath. Her headache is much better today after stopping the Bidil. Nausea has resolved. Urine looks brown in the cannister; however, patient states she was told she started her period.   Inpatient Medications    Scheduled Meds:  amLODipine  10 mg Oral Daily   carvedilol  25 mg Oral BID WC   vitamin B-12  1,000 mcg Oral Daily   ferrous sulfate  325 mg Oral Q breakfast   heparin  5,000 Units Subcutaneous Q8H   levETIRAcetam  1,000 mg Oral BID   losartan  50 mg Oral Daily   polyethylene glycol  17 g Oral BID   senna-docusate  1 tablet Oral BID   spironolactone  50 mg Oral Daily   Continuous Infusions:  PRN Meds: acetaminophen **OR** acetaminophen, albuterol, labetalol, ondansetron **OR** ondansetron (ZOFRAN) IV, mouth rinse, oxyCODONE   Vital Signs    Vitals:   07/14/23 2000 07/14/23 2007 07/14/23 2036 07/15/23 0433  BP:  111/69  105/69  Pulse: 89 86 88   Resp: 16 16 20 16   Temp:  98.1 F (36.7 C)  98.1 F (36.7 C)  TempSrc:  Oral  Oral  SpO2: 100% 99% 100% 100%  Weight:    57 kg  Height:        Intake/Output Summary (Last 24 hours) at 07/15/2023 0948 Last data filed at 07/14/2023 2150 Gross per 24 hour  Intake 220 ml  Output 450 ml  Net -230 ml      07/15/2023    4:33 AM 07/14/2023    2:32 AM 07/13/2023    3:15 AM  Last 3 Weights  Weight (lbs) 125 lb 10.6 oz 128 lb 8.5 oz 128 lb 15.5 oz  Weight (kg) 57 kg 58.3 kg 58.5 kg      Telemetry    Normal sinus rhythm with rates in the 70s to 90s. - Personally Reviewed  ECG     No new ECG tracing today.  - Personally Reviewed  Physical Exam   GEN: Thin African-American female resting comfortably in no acute distress.   Neck: No JVD. Cardiac: RRR. No murmurs, rubs, or gallops.  Respiratory: Clear to auscultation bilaterally. No wheezes, rhonchi, or rales. MS: No lower extremity edema. No deformity. Neuro:  Right sided weakness.  Psych: Normal affect.  Labs    High Sensitivity Troponin:   Recent Labs  Lab 07/11/23 2058 07/11/23 2314 07/12/23 0432 07/12/23 1208  TROPONINIHS 251* 225* 221* 199*     Chemistry Recent Labs  Lab 07/12/23 0432 07/13/23 0311 07/14/23 0426  NA 130* 131* 132*  K 2.8* 4.8 4.2  CL 93* 96* 95*  CO2 25 24 24   GLUCOSE 110* 121* 102*  BUN 10 20 24*  CREATININE 0.70 1.34* 1.00  CALCIUM 9.0 9.1 9.5  MG 1.6* 1.8 2.1  PROT 10.2* 9.3* 10.0*  ALBUMIN 3.1* 2.9* 3.1*  AST 193* 130* 113*  ALT 109* 84* 80*  ALKPHOS 75 65 68  BILITOT 0.8 0.8 1.0  GFRNONAA >60 52* >60  ANIONGAP 12 11 13     Lipids  Recent Labs  Lab 07/12/23 0432  CHOL 195  TRIG 202*  HDL 24*  LDLCALC 131*  CHOLHDL 8.1    Hematology Recent Labs  Lab 07/12/23 0432 07/12/23 1208 07/13/23 0311 07/14/23 0426  WBC 5.2  --  5.7 5.7  RBC 4.19 3.87 4.10 4.38  HGB 9.1*  --  8.9* 9.5*  HCT 30.9*  --  30.2* 32.9*  MCV 73.7*  --  73.7* 75.1*  MCH 21.7*  --  21.7* 21.7*  MCHC 29.4*  --  29.5* 28.9*  RDW 18.8*  --  18.7* 18.9*  PLT 335  --  343 370   Thyroid  Recent Labs  Lab 07/12/23 0432  TSH 5.420*    BNP Recent Labs  Lab 07/12/23 0432  BNP 1,017.3*    DDimer No results for input(s): "DDIMER" in the last 168 hours.   Radiology    US RENAL  Result Date: 07/14/2023 CLINICAL DATA:  Hypertension. EXAM: RENAL / URINARY TRACT ULTRASOUND COMPLETE COMPARISON:  CT abdomen and pelvis dated 07/12/2023. FINDINGS: Right Kidney: Renal measurements: 10.2 x 4.5 x 4.1 cm = volume: 99 mL. Echogenicity within normal limits. No mass or hydronephrosis  visualized. Left Kidney: Renal measurements: 10.2 x 5.2 x 4.0 cm = volume: 111 mL. Echogenicity within normal limits. No mass or hydronephrosis visualized. Bladder: Appears normal for degree of bladder distention. Other: None. IMPRESSION: Normal renal ultrasound. Electronically Signed   By: Romona Curls M.D.   On: 07/14/2023 12:58    Cardiac Studies   Echocardiogram 07/12/2023: Impression: 1. Left ventricular ejection fraction, by estimation, is 60 to 65%. The  left ventricle has normal function. The left ventricle has no regional  wall motion abnormalities. There is severe concentric left ventricular  hypertrophy. Left ventricular diastolic   parameters are consistent with Grade II diastolic dysfunction  (pseudonormalization).   2. Right ventricular systolic function is normal. The right ventricular  size is normal.   3. The mitral valve is grossly normal. Mild mitral valve regurgitation.   4. Tricuspid valve regurgitation is mild to moderate.   5. The aortic valve is grossly normal. Aortic valve regurgitation is not  visualized.   Conclusion(s)/Recommendation(s): Signs of hypertensive heart disease,  elevated LVEDP.    Patient Profile     37 y.o. female with a history of hypertension, asthma, anemia, cerebral palsy with right sided weakness, and seizure disorder who was admitted on 07/11/2023 for hypertensive emergency.  Assessment & Plan    Hypertensive Emergency Patient presented with chest pain, headache, blurred vision, and right arm parathesia. BP was markedly elevated at 221/139 on arrival. Patient had not been taking her BP medications consistently for the past year. Echo showed normal LV function with severe LVH and grade 2 diastolic dysfunction.. Renal ultrasounds were normal.  PO medications were restarted and BP has improved.  - BP soft this morning with systolic BP in the low 100s (this is without any medications other than Coreg). - Current medications: Amlodipine 10mg   daily, Coreg 25mg  twice daily, Losartan 50mg  daily, and Spironolactone 50mg  daily. Bidil was stopped due to headache and nausea. Given soft BP, will decrease Amlodipine to 5mg  daily.  Acute Diastolic CHF In the setting of hypertensive emergency. BNP elevated at 1,017. Chest x-ray showed no edema. Echo showed LVEF of 60-65% with severe LVH and grade 2 diastolic dysfunction. She was initially diuresed with IV Lasix. Last dose 12/7. Documented urinary output of 1.2 L yesterday. Net negative 1.1 L this admission. - Euvolemic on exam.  -  Continue Spironolactone 50mg  daily.  Atypical Chest Pain Elevated Troponin Patient reported some right sided chest pain on admission that was reproducible with palpation. EKG showed no acute ischemic changes. High-sensitivity troponin elevated but flat at 251 >> 225 >> 221. Echo showed LVEF of 60-65% and  no regional wall motion abnormalities.  - She continues to have atypical chest pain that occurs with palpitation of chest wall and lifting of right arm. - Chest pain very atypical and not consistent with cardiac chest pain. Troponin elevation consistent with demand ischemia in setting of hypertensive emergency. No ischemic evaluation necessary at this time.   AKI Baseline creatinine 0.5 to 0.6. Creatinine 0.7 on admission. Peaked at 1.34 on 12/7. - Improved to 1.00 yesterday.  - Will repeat BMET today.   Otherwise, per primary team: - TIA symptoms with persistent right sided weakness - Transamnitis - Asthma - Anemia - Cerebral palsy - Seizure disorder  Disposition: Patient is stable from a cardiac standpoint for discharge. Will arrange outpatient follow-up. Dr. Wyline Mood to follow with any additional recommendations.    For questions or updates, please contact [facility] Please consult [email or web address] for contact info under        Signed, Corrin Parker, PA-C  07/15/2023, 9:48 AM

## 2023-07-15 NOTE — TOC Progression Note (Signed)
Transition of Care [facility]) - Progression Note    Patient Details  Name: Dana Parks MRN: [id number] Date of Birth: January 23, 1986  [facility] CM/SW Contact  Ronny Bacon, RN Phone Number: 07/15/2023, 2:20 PM  Clinical Narrative:   Phone call attempt made to Stepmother to discuss home health, no response.    Expected Discharge Plan: Skilled Nursing Facility Barriers to Discharge: Continued Medical Work up  Expected Discharge Plan and Services In-house Referral: Clinical Social Work   Post Acute Care Choice: Skilled Nursing Facility Living arrangements for the past 2 months: Apartment                                       Social Determinants of Health (SDOH) Interventions SDOH Screenings   Food Insecurity: No Food Insecurity (07/12/2023)  Housing: Low Risk  (07/12/2023)  Transportation Needs: No Transportation Needs (07/12/2023)  Utilities: Not At Risk (07/12/2023)  Tobacco Use: Medium Risk (07/11/2023)    Readmission Risk Interventions     No data to display

## 2023-07-15 NOTE — Evaluation (Signed)
Speech Language Pathology Evaluation Patient Details Name: Dana Parks MRN: [id number] DOB: Sep 25, 1985 Today's Date: 07/15/2023 Time: 4098-1191 SLP Time Calculation (min) (ACUTE ONLY): 17 min  Problem List:  Patient Active Problem List   Diagnosis Date Noted   Hypertensive emergency 07/12/2023   Hypertensive urgency 07/12/2023   Pain due to onychomycosis of toenails of both feet 06/07/2023   Intractable migraine with aura with status migrainosus 01/08/2017   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   Asthma, mild intermittent, well-controlled 12/13/2014   Low lying placenta without hemorrhage, antepartum    Epilepsy affecting pregnancy, 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   Asthma complicating pregnancy, antepartum 09/09/2014   Cerebral palsy (HCC)    Seizure disorder (HCC)    Past Medical History:  Past Medical History:  Diagnosis Date   Allergic rhinitis    Anemia    of other chronic disease   Anxiety    Asthma    Back pain    unknown    Cerebral palsy (HCC)    Headache(784.0)    frequently   Hypertension    Incontinence of urine    Liver disease    Denies 09/10/2018   Low sodium diet    Lumbago 05/21/2014   pain in lower back    Muscle spasm    takes Flexeril daily   Neuromuscular disorder (HCC)    cerebral palsy   Numbness    bilateral feet   Peripheral edema    occasionally   PONV (postoperative nausea and vomiting)    Rhegmatogenous retinal detachment of both eyes    Seizure disorder (HCC)    Seizures (HCC)    last one in high school;takes Depakote daily   Wears glasses    Past Surgical History:  Past Surgical History:  Procedure Laterality Date   CESAREAN SECTION  2007   CESAREAN SECTION WITH BILATERAL TUBAL LIGATION Bilateral 03/03/2015   Procedure: CESAREAN SECTION WITH BILATERAL TUBAL LIGATION;  Surgeon: Willodean Rosenthal, MD;  Location: [facility] ORS;  Service: Obstetrics;  Laterality: Bilateral;  Requested 03/03/15 @ 3:30p  Ok per Cleo/Myra-TM   GAS/FLUID EXCHANGE  Right 09/11/2018   Procedure: Gas/Fluid Exchange RIGHT EYE;  Surgeon: Rennis Chris, MD;  Location: [facility] OR;  Service: Ophthalmology;  Laterality: Right;  [id number]   LASER PHOTO ABLATION Left 09/11/2018   Procedure: LASER PHOTO ABLATION LEFT EYE;  Surgeon: Rennis Chris, MD;  Location: [facility] OR;  Service: Ophthalmology;  Laterality: Left;   LEG SURGERY Bilateral    PARS PLANA VITRECTOMY Right 09/11/2018   Procedure: PARS PLANA VITRECTOMY WITH 25 GAUGE RIGHT EYE;  Surgeon: Rennis Chris, MD;  Location: [facility] OR;  Service: Ophthalmology;  Laterality: Right;   PERFLUORONE INJECTION Right 09/11/2018   Procedure: Perfluorone Injection RIGHT EYE;  Surgeon: Rennis Chris, MD;  Location: [facility] OR;  Service: Ophthalmology;  Laterality: Right;   PHOTOCOAGULATION WITH LASER Right 09/11/2018   Procedure: Photocoagulation With Laser RIGHT EYE;  Surgeon: Rennis Chris, MD;  Location: [facility] OR;  Service: Ophthalmology;  Laterality: Right;   SCLERAL BUCKLE Right 09/11/2018   Procedure: Scleral Buckle RIGHT EYE;  Surgeon: Rennis Chris, MD;  Location: [facility] OR;  Service: Ophthalmology;  Laterality: Right;   TOOTH EXTRACTION N/A 02/16/2013   Procedure: EXTRACTION 16, 17, 32;  Surgeon: Georgia Lopes, DDS;  Location: [facility] OR;  Service: Oral Surgery;  Laterality: N/A;   HPI:  Dana Parks is a 37 yo female presenting to ED 12/5 with acute onset of chest pain. Found to have HTN emergency, undergoing w/u for acute  onset CHF. Imaging negative for CVA. PMH includes cerebral palsy with R sided weakness, seizure disorders, asthma, anemia, HTN   Assessment / Plan / Recommendation Clinical Impression  Pt reports mild deficits related to memory and processing at baseline. She states she lives at home with her stepmother, who provides intermittent assistance with daily cognitive tasks. Pt presents with noted difficutly with selective attention, memory, and problem solving. She states she feels no changes from baseline and politely declined f/u. Recommend  ongoing SLP f/u with [facility] SLP if pt wishes to participate, although feel she is likely at her baseline. SLP to s/o acutely.    SLP Assessment  SLP Recommendation/Assessment: All further Speech Lanaguage Pathology  needs can be addressed in the next venue of care SLP Visit Diagnosis: Cognitive communication deficit (R41.841)    Recommendations for follow up therapy are one component of a multi-disciplinary discharge planning process, led by the attending physician.  Recommendations may be updated based on patient status, additional functional criteria and insurance authorization.    Follow Up Recommendations  Home health SLP    Assistance Recommended at Discharge  PRN  Functional Status Assessment Patient has not had a recent decline in their functional status  Frequency and Duration           SLP Evaluation Cognition  Overall Cognitive Status: Within Functional Limits for tasks assessed       Comprehension  Auditory Comprehension Overall Auditory Comprehension: Appears within functional limits for tasks assessed    Expression Expression Primary Mode of Expression: Verbal Verbal Expression Overall Verbal Expression: Appears within functional limits for tasks assessed Written Expression Dominant Hand: Left   Oral / Motor  Oral Motor/Sensory Function Overall Oral Motor/Sensory Function: Mild impairment Facial ROM: Reduced right Facial Symmetry: Abnormal symmetry right Facial Strength: Reduced right Facial Sensation: Reduced right Lingual ROM: Reduced right Lingual Symmetry: Abnormal symmetry right Lingual Strength: Reduced Lingual Sensation: Reduced Motor Speech Overall Motor Speech: Appears within functional limits for tasks assessed            Gwynneth Aliment, M.A., CF-SLP Speech Language Pathology, Acute Rehabilitation Services  Secure Chat preferred [phone]  07/15/2023, 3:23 PM

## 2023-07-15 NOTE — TOC Progression Note (Signed)
Transition of Care [facility]) - Progression Note    Patient Details  Name: Dana Parks MRN: [id number] Date of Birth: 11-14-1985  Transition of Care [facility]) CM/SW Contact  Dana Parks, LCSWA Phone Number: 07/15/2023, 1:46 PM  Clinical Narrative:     CSW spoke with patient at bedside regarding dc plan. Patient request for CSW to call her stepmother Dana Parks to help assist with her dc plan. CSW spoke with patients stepmother Dana Parks. Patients stepmother would like for patient to return home when medically ready for dc. CSW informed patient. Patient also in agreement to return home when medically ready. Patients stepmother Dana Parks request for CM to give her a call to discuss home health needs for patient.CSW informed CM. All questions answered. No further questions reported at this time.  Expected Discharge Plan: Skilled Nursing Facility Barriers to Discharge: Continued Medical Work up  Expected Discharge Plan and Services In-house Referral: Clinical Social Work   Post Acute Care Choice: Skilled Nursing Facility Living arrangements for the past 2 months: Apartment                                       Social Determinants of Health (SDOH) Interventions SDOH Screenings   Food Insecurity: No Food Insecurity (07/12/2023)  Housing: Low Risk  (07/12/2023)  Transportation Needs: No Transportation Needs (07/12/2023)  Utilities: Not At Risk (07/12/2023)  Tobacco Use: Medium Risk (07/11/2023)    Readmission Risk Interventions     No data to display

## 2023-07-15 NOTE — Progress Notes (Signed)
PROGRESS NOTE  Dana Parks ZOX:[id number] DOB: 21-Oct-1985 DOA: 07/11/2023 PCP: Fleet Contras, MD   LOS: 3 days   Brief Narrative / Interim history: 36 year old female with cerebral palsy with right-sided weakness, seizure disorder, asthma, anemia, HTN who comes into the hospital with chest pain as well as dizziness.  This happened the day prior to admission.  She reports history of elevated blood pressure however she is in between doctors, just establish care and has run out of all her medications.  She told me she recently went to urgent care and was prescribed amlodipine 5 mg, however felt like this was not enough and she used to take more in the past.  In the ED she was found to have some evidence of fluid overload based on the CT scan of the chest, was significantly hypertensive, and she was admitted to the hospital  Subjective / 24h Interval events: Headache better.  Sleepy this morning  Assesement and Plan: Principal problem Hypertensive emergency-patient was admitted to the hospital with significantly elevated blood pressure and some hazy infiltrates on the lung CT possibly representing edema.  BNP is significantly elevated, along with the troponins.  She also has LFT elevation.  CT and MRI of the brain without acute findings -Cardiology consulted, appreciate input.  Continue to adjust blood pressure medications daily, currently on 10 mg of amlodipine, 25 twice daily carvedilol as well as 50 of losartan 50 of spironolactone -Blood pressure much improved  Active problems Elevated troponin-likely in the setting of hypertensive emergency, flat, not in a pattern consistent with ACS.  Chest pain is improving with improvement in her blood pressure.  2D echo showed normal LVEF, severe LVH and grade 2 diastolic dysfunction.  RV was normal  AKI-due to Lasix, creatinine improved with discontinuation of diuretic  Acute on chronic diastolic CHF-with some evidence of fluid overload on  admission, status post Lasix.  Currently on spironolactone  Hypokalemia, hypomagnesemia-replace and continue to monitor  Hyponatremia-monitor sodium, may have slight fluid overload  Elevated LFTs-possibly due to #1.  Hold statin for now.  Improving.  Hepatitis panel negative.  LFTs continue to improve today  History of seizures-continue Keppra  Cerebral palsy-noted.  She tells me that she has difficulties ambulating at baseline, has no wheelchair or walker.  Has had occasional falls and when she is down she is very difficult for her to get up.  PT recommends SNF, patient agreeable.  TOC consulted, however due to age and substance use she has no beds offers and may be a barrier to placement  History of asthma-no wheezing, stable  Anemia-anemia panel unremarkable but B12 is on the low side.  Will replete  Scheduled Meds:  amLODipine  10 mg Oral Daily   carvedilol  25 mg Oral BID WC   vitamin B-12  1,000 mcg Oral Daily   ferrous sulfate  325 mg Oral Q breakfast   heparin  5,000 Units Subcutaneous Q8H   levETIRAcetam  1,000 mg Oral BID   losartan  50 mg Oral Daily   polyethylene glycol  17 g Oral BID   senna-docusate  1 tablet Oral BID   spironolactone  50 mg Oral Daily   Continuous Infusions:   PRN Meds:.acetaminophen **OR** acetaminophen, albuterol, labetalol, ondansetron **OR** ondansetron (ZOFRAN) IV, mouth rinse, oxyCODONE  Current Outpatient Medications  Medication Instructions   albuterol (PROVENTIL HFA;VENTOLIN HFA) 108 (90 BASE) MCG/ACT inhaler 2 puffs, Inhalation, Every 6 hours PRN   amLODipine (NORVASC) 5 mg, Oral, Daily at bedtime  budesonide-formoterol (SYMBICORT) 160-4.5 MCG/ACT inhaler 2 puffs, Inhalation, 2 times daily, Rinse mouth with water after each use   fluticasone (FLONASE) 50 MCG/ACT nasal spray 1 spray, Each Nare, 2 times daily   ibuprofen (ADVIL) 800 mg, Oral, Every 8 hours PRN   levETIRAcetam (KEPPRA) 1,000 mg, Oral, 2 times daily    Diet Orders  (From admission, onward)     Start     Ordered   07/13/23 0923  Diet Heart Room service appropriate? Yes with Assist; Fluid consistency: Thin  Diet effective now       Question Answer Comment  Room service appropriate? Yes with Assist   Fluid consistency: Thin      07/13/23 1610            DVT prophylaxis: heparin injection 5,000 Units Start: 07/12/23 0600   Lab Results  Component Value Date   PLT 370 07/14/2023      Code Status: Full Code  Family Communication: n ofamily at bedside   Status is: Inpatient Remains inpatient appropriate because: severity of illness  Level of care: Telemetry Cardiac  Consultants:  none  Objective: Vitals:   07/14/23 2007 07/14/23 2036 07/15/23 0433 07/15/23 0840  BP: 111/69  105/69 103/68  Pulse: 86 88  83  Resp: 16 20 16 16   Temp: 98.1 F (36.7 C)  98.1 F (36.7 C)   TempSrc: Oral  Oral   SpO2: 99% 100% 100% 100%  Weight:   57 kg   Height:        Intake/Output Summary (Last 24 hours) at 07/15/2023 1023 Last data filed at 07/14/2023 2150 Gross per 24 hour  Intake 220 ml  Output 450 ml  Net -230 ml   Wt Readings from Last 3 Encounters:  07/15/23 57 kg  03/08/22 68.3 kg  01/02/22 70.3 kg    Examination:  Constitutional: NAD Eyes: lids and conjunctivae normal, no scleral icterus ENMT: mmm Neck: normal, supple Respiratory: clear to auscultation bilaterally, no wheezing, no crackles. Normal respiratory effort.  Cardiovascular: Regular rate and rhythm, no murmurs / rubs / gallops. No LE edema. Abdomen: soft, no distention, no tenderness. Bowel sounds positive.    Data Reviewed: I have independently reviewed following labs and imaging studies   CBC Recent Labs  Lab 07/11/23 2058 07/12/23 0432 07/13/23 0311 07/14/23 0426  WBC 3.7* 5.2 5.7 5.7  HGB 8.5* 9.1* 8.9* 9.5*  HCT 30.1* 30.9* 30.2* 32.9*  PLT 296 335 343 370  MCV 75.3* 73.7* 73.7* 75.1*  MCH 21.3* 21.7* 21.7* 21.7*  MCHC 28.2* 29.4* 29.5* 28.9*   RDW 18.6* 18.8* 18.7* 18.9*    Recent Labs  Lab 07/11/23 2058 07/11/23 2314 07/12/23 0432 07/13/23 0311 07/14/23 0426  NA 132*  --  130* 131* 132*  K 3.0*  --  2.8* 4.8 4.2  CL 97*  --  93* 96* 95*  CO2 23  --  25 24 24   GLUCOSE 88  --  110* 121* 102*  BUN 12  --  10 20 24*  CREATININE 0.72  --  0.70 1.34* 1.00  CALCIUM 9.0  --  9.0 9.1 9.5  AST  --  202* 193* 130* 113*  ALT  --  108* 109* 84* 80*  ALKPHOS  --  75 75 65 68  BILITOT  --  0.7 0.8 0.8 1.0  ALBUMIN  --  3.0* 3.1* 2.9* 3.1*  MG  --   --  1.6* 1.8 2.1  TSH  --   --  5.420*  --   --   HGBA1C  --   --  5.1  --   --   BNP  --   --  1,017.3*  --   --     ------------------------------------------------------------------------------------------------------------------ No results for input(s): "CHOL", "HDL", "LDLCALC", "TRIG", "CHOLHDL", "LDLDIRECT" in the last 72 hours.   Lab Results  Component Value Date   HGBA1C 5.1 07/12/2023   ------------------------------------------------------------------------------------------------------------------ No results for input(s): "TSH", "T4TOTAL", "T3FREE", "THYROIDAB" in the last 72 hours.  Invalid input(s): "FREET3"   Cardiac Enzymes No results for input(s): "CKMB", "TROPONINI", "MYOGLOBIN" in the last 168 hours.  Invalid input(s): "CK" ------------------------------------------------------------------------------------------------------------------    Component Value Date/Time   BNP 1,017.3 (H) 07/12/2023 0432    CBG: Recent Labs  Lab 07/14/23 0729 07/14/23 1201 07/14/23 1620 07/14/23 2123 07/15/23 0721  GLUCAP 87 90 103* 104* 83    No results found for this or any previous visit (from the past 240 hour(s)).   Radiology Studies: US RENAL  Result Date: 07/14/2023 CLINICAL DATA:  Hypertension. EXAM: RENAL / URINARY TRACT ULTRASOUND COMPLETE COMPARISON:  CT abdomen and pelvis dated 07/12/2023. FINDINGS: Right Kidney: Renal measurements: 10.2 x 4.5 x  4.1 cm = volume: 99 mL. Echogenicity within normal limits. No mass or hydronephrosis visualized. Left Kidney: Renal measurements: 10.2 x 5.2 x 4.0 cm = volume: 111 mL. Echogenicity within normal limits. No mass or hydronephrosis visualized. Bladder: Appears normal for degree of bladder distention. Other: None. IMPRESSION: Normal renal ultrasound. Electronically Signed   By: Romona Curls M.D.   On: 07/14/2023 12:58     Pamella Pert, MD, PhD [facility]  Between 7 am - 7 pm I am available, please contact me via Amion (for emergencies) or Securechat (non urgent messages)  Between 7 pm - 7 am I am not available, please contact night coverage MD/APP via Amion

## 2023-07-15 NOTE — Plan of Care (Signed)
  Problem: Self-Care: Goal: Ability to participate in self-care as condition permits will improve Outcome: Progressing   Problem: Clinical Measurements: Goal: Ability to maintain clinical measurements within normal limits will improve Outcome: Progressing Goal: Respiratory complications will improve Outcome: Progressing Goal: Cardiovascular complication will be avoided Outcome: Progressing   Problem: Safety: Goal: Ability to remain free from injury will improve Outcome: Progressing   Problem: Cardiac: Goal: Ability to achieve and maintain adequate cardiopulmonary perfusion will improve Outcome: Progressing

## 2023-07-15 NOTE — Care Management Important Message (Signed)
Important Message  Patient Details  Name: Dana Parks MRN: [id number] Date of Birth: 08-Aug-1985   Important Message Given:  Yes - Medicare IM     Sherilyn Banker 07/15/2023, 4:32 PM

## 2023-07-16 ENCOUNTER — Inpatient Hospital Stay (HOSPITAL_COMMUNITY): Payer: 59

## 2023-07-16 ENCOUNTER — Ambulatory Visit: Payer: 59 | Admitting: Family

## 2023-07-16 DIAGNOSIS — I161 Hypertensive emergency: Secondary | ICD-10-CM | POA: Diagnosis not present

## 2023-07-16 LAB — BASIC METABOLIC PANEL
Anion gap: 11 (ref 5–15)
BUN: 42 mg/dL — ABNORMAL HIGH (ref 6–20)
CO2: 23 mmol/L (ref 22–32)
Calcium: 9.2 mg/dL (ref 8.9–10.3)
Chloride: 95 mmol/L — ABNORMAL LOW (ref 98–111)
Creatinine, Ser: 1.31 mg/dL — ABNORMAL HIGH (ref 0.44–1.00)
GFR, Estimated: 53 mL/min — ABNORMAL LOW (ref >60)
Glucose, Bld: 85 mg/dL (ref 70–99)
Potassium: 4.7 mmol/L (ref 3.5–5.1)
Sodium: 129 mmol/L — ABNORMAL LOW (ref 135–145)

## 2023-07-16 LAB — GLUCOSE, CAPILLARY
Glucose-Capillary: 85 mg/dL (ref 70–99)
Glucose-Capillary: 98 mg/dL (ref 70–99)
Glucose-Capillary: 87 mg/dL (ref 70–99)
Glucose-Capillary: 112 mg/dL — ABNORMAL HIGH (ref 70–99)

## 2023-07-16 MED ORDER — SPIRONOLACTONE 25 MG PO TABS
25.0000 mg | ORAL_TABLET | Freq: Every day | ORAL | Status: DC
  Administered 2023-07-16 – 2023-07-17 (×2): 25 mg via ORAL
  Filled 2023-07-16 (×2): qty 1

## 2023-07-16 NOTE — TOC Transition Note (Signed)
Transition of Care [facility]) - CM/SW Discharge Note   Patient Details  Name: Dana Parks MRN: [id number] Date of Birth: 1986-07-05  [facility] CM/SW Contact:  Lawerance Sabal, RN Phone Number: 07/16/2023, 1:45 PM   Clinical Narrative:     Sherron Monday w patient's stepmother, Thurston Hole,  over the phone.   She states that they have shower seat, 3/1 at home. She declines WC and would like a rollator. Rollator will be delivered to the room through [employer] this afternoon.  We discussed therapy follow up after discharge. It is her preference to have outpatient therapies. Discussed w attending and Dr Elvera Lennox permitted me to put in OP PT OT SLP referral. Referral placed to [facility], AVS updated.   Thurston Hole states that she will provide transportation home at DC.      [name] (Stepmother) [phone]      Final next level of care: Home/Self Care Barriers to Discharge: Continued Medical Work up   Patient Goals and CMS Choice   Choice offered to / list presented to : Patient  Discharge Placement                         Discharge Plan and Services Additional resources added to the After Visit Summary for   In-house Referral: Clinical Social Work   Post Acute Care Choice: Skilled Nursing Facility          DME Arranged: Dan Humphreys rolling with seat DME Agency: [employer]       HH Arranged: Refused HH          Social Determinants of Health (SDOH) Interventions SDOH Screenings   Food Insecurity: No Food Insecurity (07/12/2023)  Housing: Low Risk  (07/12/2023)  Transportation Needs: No Transportation Needs (07/12/2023)  Utilities: Not At Risk (07/12/2023)  Tobacco Use: Medium Risk (07/11/2023)     Readmission Risk Interventions     No data to display

## 2023-07-16 NOTE — Plan of Care (Signed)
  Problem: Education: Goal: Knowledge of disease or condition will improve Outcome: Progressing   Problem: Education: Goal: Knowledge of secondary prevention will improve (MUST DOCUMENT ALL) Outcome: Progressing   Problem: Coping: Goal: Will verbalize positive feelings about self Outcome: Completed/Met   Problem: Coping: Goal: Will identify appropriate support needs Outcome: Completed/Met

## 2023-07-16 NOTE — Progress Notes (Signed)
Physical Therapy Treatment Patient Details Name: Dana Parks MRN: [id number] DOB: Dec 24, 1985 Today's Date: 07/16/2023   History of Present Illness The pt is a 37 yo female presenting 12/5 with R-sided chest pain that radiated up her back. Pt found to have HTN emergency (216/144), undergoing continued work up for acute onset CHF, imaging negative for stroke. PMH includes: limited access to healthcare and compliance with medications, Cerebral Palsy with R-sided weakness, seizure, asthma, anemia, and HTN.    PT Comments  Pt continues to be very weak and require significant assist with all mobility. Only able to amb 5' at a time with mod assist. Prior to admission pt was modified independent with ambulation without assistive device. Pt/family declined SNF and plan on pt going home. Will need help with all mobility and recommend rollator, wheelchair, and 3-in-1 commode as well as HHPT.     If plan is discharge home, recommend the following: A lot of help with walking and/or transfers;A lot of help with bathing/dressing/bathroom;Assistance with cooking/housework;Direct supervision/assist for medications management;Direct supervision/assist for financial management;Help with stairs or ramp for entrance   Can travel by private vehicle        Equipment Recommendations  Rollator (4 wheels);BSC/3in1;Wheelchair (measurements PT);Wheelchair cushion (measurements PT)    Recommendations for Other Services       Precautions / Restrictions Precautions Precautions: Fall Restrictions Weight Bearing Restrictions: No     Mobility  Bed Mobility Overal bed mobility: Needs Assistance Bed Mobility: Supine to Sit, Sit to Supine     Supine to sit: Max assist Sit to supine: Max assist   General bed mobility comments: Assist to bring legs off of bed, elevate trunk into sitting and bring hips to EOB. Assist to lower trunk and bring legs back up into bed. Pt unable to use LUE to assist due to shoulder  pain    Transfers Overall transfer level: Needs assistance Equipment used: Rollator (4 wheels), Rolling walker (2 wheels) Transfers: Sit to/from Stand Sit to Stand: Mod assist           General transfer comment: Assist to bring hips up and for balance. Slow to rise and assist to fully extend hips    Ambulation/Gait Ambulation/Gait assistance: Mod assist Gait Distance (Feet): 5 Feet (x 2) Assistive device: Rolling walker (2 wheels), Rollator (4 wheels) Gait Pattern/deviations: Step-to pattern, Decreased step length - right, Decreased step length - left, Shuffle, Trunk flexed, Narrow base of support Gait velocity: decr Gait velocity interpretation: <1.31 ft/sec, indicative of household ambulator   General Gait Details: Assist for balance and support. Pt fatigues quickly. Pt preferred rollator.   Stairs             Wheelchair Mobility     Tilt Bed    Modified Rankin (Stroke Patients Only)       Balance Overall balance assessment: Needs assistance Sitting-balance support: Feet supported, Single extremity supported Sitting balance-Leahy Scale: Poor     Standing balance support: Single extremity supported, During functional activity, Bilateral upper extremity supported, Reliant on assistive device for balance Standing balance-Leahy Scale: Poor Standing balance comment: Rollator and initial mod assist for static standing                            Cognition Arousal: Alert Behavior During Therapy: Flat affect Overall Cognitive Status: Within Functional Limits for tasks assessed  General Comments: Incr time to process        Exercises      General Comments        Pertinent Vitals/Pain Pain Assessment Pain Assessment: Faces Faces Pain Scale: Hurts even more Pain Location: lt shoulder Pain Descriptors / Indicators: Grimacing, Aching Pain Intervention(s): Limited activity within patient's  tolerance, Monitored during session, Premedicated before session, Repositioned    Home Living                          Prior Function            PT Goals (current goals can now be found in the care plan section) Acute Rehab PT Goals Patient Stated Goal: to be able to walk on her own Progress towards PT goals: Progressing toward goals (slowly)    Frequency    Min 1X/week      PT Plan      Co-evaluation              AM-PAC PT "6 Clicks" Mobility   Outcome Measure  Help needed turning from your back to your side while in a flat bed without using bedrails?: A Lot Help needed moving from lying on your back to sitting on the side of a flat bed without using bedrails?: A Lot Help needed moving to and from a bed to a chair (including a wheelchair)?: A Lot Help needed standing up from a chair using your arms (e.g., wheelchair or bedside chair)?: A Lot Help needed to walk in hospital room?: Total Help needed climbing 3-5 steps with a railing? : Total 6 Click Score: 10    End of Session Equipment Utilized During Treatment: Gait belt Activity Tolerance: Patient limited by fatigue Patient left: in chair;with call bell/phone within reach;with chair alarm set Nurse Communication: Mobility status PT Visit Diagnosis: Unsteadiness on feet (R26.81);Muscle weakness (generalized) (M62.81);Pain Pain - Right/Left: Left Pain - part of body: Shoulder     Time: 6962-9528 PT Time Calculation (min) (ACUTE ONLY): 39 min  Charges:    $Therapeutic Activity: 38-52 mins PT General Charges $$ ACUTE PT VISIT: 1 Visit                     [facility] PT Acute Rehabilitation Services Office [phone]    Angelina Ok [facility] 07/16/2023, 12:12 PM

## 2023-07-16 NOTE — Progress Notes (Signed)
PROGRESS NOTE  NKECHINYERE Parks WGN:[id number] DOB: 1985/12/07 DOA: 07/11/2023 PCP: Fleet Contras, MD   LOS: 4 days   Brief Narrative / Interim history: 37 year old female with cerebral palsy with right-sided weakness, seizure disorder, asthma, anemia, HTN who comes into the hospital with chest pain as well as dizziness.  This happened the day prior to admission.  She reports history of elevated blood pressure however she is in between doctors, just establish care and has run out of all her medications.  She told me she recently went to urgent care and was prescribed amlodipine 5 mg, however felt like this was not enough and she used to take more in the past.  In the ED she was found to have some evidence of fluid overload based on the CT scan of the chest, was significantly hypertensive, and she was admitted to the hospital  Subjective / 24h Interval events: Complains of severe left shoulder pain.  No chest discomfort, no headaches.  No shortness of breath.  Assesement and Plan: Principal problem Hypertensive emergency-patient was admitted to the hospital with significantly elevated blood pressure and some hazy infiltrates on the lung CT possibly representing edema.  BNP is significantly elevated, along with the troponins.  She also has LFT elevation.  CT and MRI of the brain without acute findings -Cardiology consulted, appreciate input.  She was placed on 10 mg of amlodipine, 50 mg losartan, 50 mg of spironolactone, however became hypotensive and amlodipine was cut in half at 5 mg.  Remains intermittently hypotensive, decrease spironolactone today due to slightly worsening hyponatremia  Active problems Shoulder pain-unclear significance, tells me she has this once in a while at home and usually gets better with conservative management.  Obtain x-ray  Elevated troponin-likely in the setting of hypertensive emergency, flat, not in a pattern consistent with ACS.  Chest pain is improving with  improvement in her blood pressure.  2D echo showed normal LVEF, severe LVH and grade 2 diastolic dysfunction.  RV was normal  AKI-due to Lasix, creatinine was up to 1.7, better this morning at 1.3.  Hyponatremia -has been hyponatremic in the past in 2016, but stable since up until this hospitalization.  Sodium slightly lower at 129 today.  Decrease the dose of spironolactone as above  Acute on chronic diastolic CHF-with some evidence of fluid overload on admission, status post Lasix.  Currently on spironolactone.  Appears euvolemic  Hypokalemia, hypomagnesemia-replace and continue to monitor  Elevated LFTs-possibly due to #1.  Improving, hepatitis panel negative  History of seizures-continue Keppra  Cerebral palsy-noted.  She tells me that she has difficulties ambulating at baseline, has no wheelchair or walker.  Has had occasional falls and when she is down she is very difficult for her to get up.  PT recommends SNF, patient agreeable.  TOC consulted, however due to age and substance use she has no beds offers and may be a barrier to placement.  May need to go back home with home health, ongoing discussions, I was unable to reach family today  History of asthma-no wheezing, stable  Anemia-anemia panel unremarkable but B12 is on the low side.  Will replete  Scheduled Meds:  amLODipine  5 mg Oral Daily   carvedilol  25 mg Oral BID WC   vitamin B-12  1,000 mcg Oral Daily   ferrous sulfate  325 mg Oral Q breakfast   heparin  5,000 Units Subcutaneous Q8H   levETIRAcetam  1,000 mg Oral BID   losartan  50 mg  Oral Daily   polyethylene glycol  17 g Oral BID   senna-docusate  1 tablet Oral BID   spironolactone  25 mg Oral Daily   Continuous Infusions:   PRN Meds:.acetaminophen **OR** acetaminophen, albuterol, labetalol, ondansetron **OR** ondansetron (ZOFRAN) IV, mouth rinse, oxyCODONE  Current Outpatient Medications  Medication Instructions   albuterol (PROVENTIL HFA;VENTOLIN HFA) 108  (90 BASE) MCG/ACT inhaler 2 puffs, Inhalation, Every 6 hours PRN   amLODipine (NORVASC) 5 mg, Oral, Daily at bedtime   budesonide-formoterol (SYMBICORT) 160-4.5 MCG/ACT inhaler 2 puffs, Inhalation, 2 times daily, Rinse mouth with water after each use   fluticasone (FLONASE) 50 MCG/ACT nasal spray 1 spray, Each Nare, 2 times daily   ibuprofen (ADVIL) 800 mg, Oral, Every 8 hours PRN   levETIRAcetam (KEPPRA) 1,000 mg, Oral, 2 times daily    Diet Orders (From admission, onward)     Start     Ordered   07/13/23 0923  Diet Heart Room service appropriate? Yes with Assist; Fluid consistency: Thin  Diet effective now       Question Answer Comment  Room service appropriate? Yes with Assist   Fluid consistency: Thin      07/13/23 0922            DVT prophylaxis: heparin injection 5,000 Units Start: 07/12/23 0600   Lab Results  Component Value Date   PLT 370 07/14/2023      Code Status: Full Code  Family Communication: n ofamily at bedside   Status is: Inpatient Remains inpatient appropriate because: severity of illness  Level of care: Telemetry Cardiac  Consultants:  none  Objective: Vitals:   07/15/23 2310 07/16/23 0341 07/16/23 0730 07/16/23 1104  BP:  111/77 109/66 (!) 97/56  Pulse: 77 78 81 70  Resp: 14 18 14 16   Temp:  97.9 F (36.6 C) 98.2 F (36.8 C) 97.8 F (36.6 C)  TempSrc:  Oral Oral Oral  SpO2: 100% 100% 100% 100%  Weight:  54.1 kg    Height:        Intake/Output Summary (Last 24 hours) at 07/16/2023 1122 Last data filed at 07/16/2023 0825 Gross per 24 hour  Intake 236 ml  Output 100 ml  Net 136 ml   Wt Readings from Last 3 Encounters:  07/16/23 54.1 kg  03/08/22 68.3 kg  01/02/22 70.3 kg    Examination:  Constitutional: NAD Eyes: lids and conjunctivae normal, no scleral icterus ENMT: mmm Neck: normal, supple Respiratory: clear to auscultation bilaterally, no wheezing, no crackles. Normal respiratory effort.  Cardiovascular: Regular  rate and rhythm, no murmurs / rubs / gallops. No LE edema. Abdomen: soft, no distention, no tenderness. Bowel sounds positive.    Data Reviewed: I have independently reviewed following labs and imaging studies   CBC Recent Labs  Lab 07/11/23 2058 07/12/23 0432 07/13/23 0311 07/14/23 0426  WBC 3.7* 5.2 5.7 5.7  HGB 8.5* 9.1* 8.9* 9.5*  HCT 30.1* 30.9* 30.2* 32.9*  PLT 296 335 343 370  MCV 75.3* 73.7* 73.7* 75.1*  MCH 21.3* 21.7* 21.7* 21.7*  MCHC 28.2* 29.4* 29.5* 28.9*  RDW 18.6* 18.8* 18.7* 18.9*    Recent Labs  Lab 07/11/23 2314 07/12/23 0432 07/13/23 0311 07/14/23 0426 07/15/23 1047 07/16/23 0724  NA  --  130* 131* 132* 132* 129*  K  --  2.8* 4.8 4.2 5.0 4.7  CL  --  93* 96* 95* 96* 95*  CO2  --  25 24 24 25 23   GLUCOSE  --  110*  121* 102* 105* 85  BUN  --  10 20 24* 38* 42*  CREATININE  --  0.70 1.34* 1.00 1.79* 1.31*  CALCIUM  --  9.0 9.1 9.5 9.5 9.2  AST 202* 193* 130* 113*  --   --   ALT 108* 109* 84* 80*  --   --   ALKPHOS 75 75 65 68  --   --   BILITOT 0.7 0.8 0.8 1.0  --   --   ALBUMIN 3.0* 3.1* 2.9* 3.1*  --   --   MG  --  1.6* 1.8 2.1  --   --   TSH  --  5.420*  --   --   --   --   HGBA1C  --  5.1  --   --   --   --   BNP  --  1,017.3*  --   --   --   --     ------------------------------------------------------------------------------------------------------------------ No results for input(s): "CHOL", "HDL", "LDLCALC", "TRIG", "CHOLHDL", "LDLDIRECT" in the last 72 hours.   Lab Results  Component Value Date   HGBA1C 5.1 07/12/2023   ------------------------------------------------------------------------------------------------------------------ No results for input(s): "TSH", "T4TOTAL", "T3FREE", "THYROIDAB" in the last 72 hours.  Invalid input(s): "FREET3"   Cardiac Enzymes No results for input(s): "CKMB", "TROPONINI", "MYOGLOBIN" in the last 168 hours.  Invalid input(s):  "CK" ------------------------------------------------------------------------------------------------------------------    Component Value Date/Time   BNP 1,017.3 (H) 07/12/2023 0432    CBG: Recent Labs  Lab 07/15/23 0721 07/15/23 1119 07/15/23 1627 07/16/23 0727 07/16/23 1102  GLUCAP 83 93 84 85 98    No results found for this or any previous visit (from the past 240 hour(s)).   Radiology Studies: No results found.   Pamella Pert, MD, PhD [facility]  Between 7 am - 7 pm I am available, please contact me via Amion (for emergencies) or Securechat (non urgent messages)  Between 7 pm - 7 am I am not available, please contact night coverage MD/APP via Amion

## 2023-07-16 NOTE — TOC Progression Note (Addendum)
Transition of Care [facility]) - Progression Note    Patient Details  Name: Dana Parks MRN: [id number] Date of Birth: [birth year]  [facility]) CM/SW Contact  Lawerance Sabal, RN Phone Number: 07/16/2023, 10:34 AM  Clinical Narrative:     Stopped by patient room, she is alone. Called patient's stepmother Dana Parks to discuss [facility] providers, VM is full and I cannot leave a message. Requested bedside nurse to notify me if family visits   Dana Parks (Stepmother) [phone]    Expected Discharge Plan: Skilled Nursing Facility Barriers to Discharge: Continued Medical Work up  Expected Discharge Plan and Services In-house Referral: Clinical Social Work   Post Acute Care Choice: Skilled Nursing Facility Living arrangements for the past 2 months: Apartment                                       Social Determinants of Health (SDOH) Interventions SDOH Screenings   Food Insecurity: No Food Insecurity (07/12/2023)  Housing: Low Risk  (07/12/2023)  Transportation Needs: No Transportation Needs (07/12/2023)  Utilities: Not At Risk (07/12/2023)  Tobacco Use: Medium Risk (07/11/2023)    Readmission Risk Interventions     No data to display

## 2023-07-16 NOTE — Plan of Care (Signed)
  Problem: Self-Care: Goal: Ability to participate in self-care as condition permits will improve Outcome: Progressing   Problem: Clinical Measurements: Goal: Ability to maintain clinical measurements within normal limits will improve Outcome: Progressing Goal: Respiratory complications will improve Outcome: Progressing Goal: Cardiovascular complication will be avoided Outcome: Progressing   Problem: Safety: Goal: Ability to remain free from injury will improve Outcome: Progressing   Problem: Cardiac: Goal: Ability to achieve and maintain adequate cardiopulmonary perfusion will improve Outcome: Progressing

## 2023-07-17 ENCOUNTER — Inpatient Hospital Stay (HOSPITAL_COMMUNITY): Payer: 59

## 2023-07-17 DIAGNOSIS — I161 Hypertensive emergency: Secondary | ICD-10-CM | POA: Diagnosis not present

## 2023-07-17 LAB — COMPREHENSIVE METABOLIC PANEL
ALT: 64 U/L — ABNORMAL HIGH (ref 0–44)
AST: 109 U/L — ABNORMAL HIGH (ref 15–41)
Albumin: 2.9 g/dL — ABNORMAL LOW (ref 3.5–5.0)
Alkaline Phosphatase: 71 U/L (ref 38–126)
Anion gap: 8 (ref 5–15)
BUN: 32 mg/dL — ABNORMAL HIGH (ref 6–20)
CO2: 25 mmol/L (ref 22–32)
Calcium: 9.5 mg/dL (ref 8.9–10.3)
Chloride: 94 mmol/L — ABNORMAL LOW (ref 98–111)
Creatinine, Ser: 1.38 mg/dL — ABNORMAL HIGH (ref 0.44–1.00)
GFR, Estimated: 50 mL/min — ABNORMAL LOW (ref >60)
Glucose, Bld: 92 mg/dL (ref 70–99)
Potassium: 4.8 mmol/L (ref 3.5–5.1)
Sodium: 127 mmol/L — ABNORMAL LOW (ref 135–145)
Total Bilirubin: 0.6 mg/dL (ref <1.2)
Total Protein: 9.3 g/dL — ABNORMAL HIGH (ref 6.5–8.1)

## 2023-07-17 LAB — GLUCOSE, CAPILLARY
Glucose-Capillary: 91 mg/dL (ref 70–99)
Glucose-Capillary: 88 mg/dL (ref 70–99)

## 2023-07-17 LAB — CBC
HCT: 34.6 % — ABNORMAL LOW (ref 36.0–46.0)
Hemoglobin: 10.2 g/dL — ABNORMAL LOW (ref 12.0–15.0)
MCH: 22 pg — ABNORMAL LOW (ref 26.0–34.0)
MCHC: 29.5 g/dL — ABNORMAL LOW (ref 30.0–36.0)
MCV: 74.6 fL — ABNORMAL LOW (ref 80.0–100.0)
Platelets: 365 10*3/uL (ref 150–400)
RBC: 4.64 MIL/uL (ref 3.87–5.11)
RDW: 18.5 % — ABNORMAL HIGH (ref 11.5–15.5)
WBC: 3.8 10*3/uL — ABNORMAL LOW (ref 4.0–10.5)
nRBC: 0 % (ref 0.0–0.2)

## 2023-07-17 LAB — MAGNESIUM: Magnesium: 2.4 mg/dL (ref 1.7–2.4)

## 2023-07-17 MED ORDER — ACETAMINOPHEN 650 MG RE SUPP: 650.0000 mg | Freq: Four times a day (QID) | RECTAL | Status: DC | PRN

## 2023-07-17 MED ORDER — SPIRONOLACTONE 12.5 MG HALF TABLET
12.5000 mg | ORAL_TABLET | Freq: Every day | ORAL | Status: DC
  Administered 2023-07-18: 12.5 mg via ORAL
  Filled 2023-07-17: qty 1

## 2023-07-17 MED ORDER — ACETAMINOPHEN 325 MG PO TABS: 650.0000 mg | ORAL_TABLET | Freq: Four times a day (QID) | ORAL | Status: DC | PRN

## 2023-07-17 MED ORDER — DIPHENHYDRAMINE HCL 50 MG/ML IJ SOLN
12.5000 mg | Freq: Once | INTRAMUSCULAR | Status: AC
  Administered 2023-07-17: 12.5 mg via INTRAVENOUS
  Filled 2023-07-17: qty 1

## 2023-07-17 MED ORDER — PROCHLORPERAZINE EDISYLATE 10 MG/2ML IJ SOLN
10.0000 mg | Freq: Once | INTRAMUSCULAR | Status: AC
  Administered 2023-07-17: 10 mg via INTRAVENOUS
  Filled 2023-07-17: qty 2

## 2023-07-17 MED ORDER — PANTOPRAZOLE SODIUM 40 MG IV SOLR
40.0000 mg | Freq: Two times a day (BID) | INTRAVENOUS | Status: DC
  Administered 2023-07-17 – 2023-07-18 (×2): 40 mg via INTRAVENOUS
  Filled 2023-07-17 (×2): qty 10

## 2023-07-17 MED ORDER — BUTALBITAL-APAP-CAFFEINE 50-325-40 MG PO TABS
1.0000 | ORAL_TABLET | Freq: Four times a day (QID) | ORAL | Status: DC | PRN
  Administered 2023-07-18: 1 via ORAL
  Filled 2023-07-17: qty 1

## 2023-07-17 NOTE — Progress Notes (Addendum)
Occupational Therapy Treatment Patient Details Name: Dana Parks MRN: [id number] DOB: 28-May-1986 Today's Date: 07/17/2023   History of present illness The pt is a 37 yo female presenting 12/5 with R-sided chest pain that radiated up her back. Pt found to have HTN emergency (216/144), undergoing continued work up for acute onset CHF, imaging negative for stroke. PMH includes: limited access to healthcare and compliance with medications, Cerebral Palsy with R-sided weakness, seizure, asthma, anemia, and HTN.   OT comments  OT session focused on training in use of adaptive equipment for UB/LB ADLs with pt verbalizing and demonstrating understanding of all training through teach back. Pt will benefit from additional education to increased comfort and fluency with use of AE. Pt currently demonstrates ability to complete UB ADLs in sitting with use of AE with Set up to Min assist, LB ADLs in sit/stand and sit/lateral leans with use of AE with Min to Max assist and increased time, and functional step-pivot transfers with a Rollator with Mod assist. Pt participated well in session and is making progress toward OT goals. Pt planned for acute discharge today. If pt does not discharge as planned, pt will benefit from continued acute skilled OT services to address deficits outlined below and increase safety, decrease caregiver burden, and independence with functional tasks. OT recommends post-acute intensive inpatient skilled rehab services < 3 hours per day to maximize rehab potential. Pt reports agreement with this recommendation. However, pt also reports her family preferred for there to discharge home with [facility] rehab. With pt ok, OT shared pt's statements, preferences, and concerns regarding discharge plans with Case Manager, Social Worker, and Charity fundraiser and discussed pt's current functional level/rehab recommendations with PT.       If plan is discharge home, recommend the following:  A lot of help with walking  and/or transfers;A lot of help with bathing/dressing/bathroom;Assistance with cooking/housework;Assistance with feeding;Assist for transportation;Help with stairs or ramp for entrance (Set up and use of AE for self feeding)   Equipment Recommendations  BSC/3in1;Other (comment) (Rollator; AE for LB dressing; gait belt and foam built up grips - both provided this session)    Recommendations for Other Services      Precautions / Restrictions Precautions Precautions: Fall Restrictions Weight Bearing Restrictions: No       Mobility Bed Mobility Overal bed mobility: Needs Assistance             General bed mobility comments: Pt sitting in recliner at beginning of session and on BSC at end of session. OT edcuated pt in use of gait belt to assist in lifting legs for increased safety and independence with bed mobility with pt demonstrating understanding through teach back in simulated task while sitting in recliner.    Transfers Overall transfer level: Needs assistance Equipment used: Rollator (4 wheels), Rolling walker (2 wheels) Transfers: Sit to/from Stand, Bed to chair/wheelchair/BSC Sit to Stand: Min assist     Step pivot transfers: Mod assist     General transfer comment: Min assist to power up and stabilize balance in standing.Mod assist to maintain balance in stepping. Pt required one verbal cue to lock Rollator and demonstrated ability to lock/umlock Rollator with Mod I after cue.     Balance Overall balance assessment: Needs assistance Sitting-balance support: Single extremity supported, No upper extremity supported, Feet supported Sitting balance-Leahy Scale: Poor (Poor with unsupported back; Fair with supported back)   Postural control: Other (comment) (flexed posture) Standing balance support: During functional activity, Bilateral upper extremity supported, Reliant  on assistive device for balance Standing balance-Leahy Scale: Poor                              ADL either performed or assessed with clinical judgement   ADL Overall ADL's : Needs assistance/impaired Eating/Feeding: Set up;With adaptive utensils;Sitting Eating/Feeding Details (indicate cue type and reason): pt provided with foam for built-up grips for utensils this session with pt demonstrating understanding of use through teach back and reporting foam grips are helpful Grooming: Set up;Sitting;With adaptive equipment (with increased time) Grooming Details (indicate cue type and reason): pt provided with foam for built-up grips for grooming items this session with pt demonstrating understanding of use through teach back and reporting foam grips are helpful Upper Body Bathing: Minimal assistance;Sitting;Cueing for compensatory techniques;With adaptive equipment (with increased time) Upper Body Bathing Details (indicate cue type and reason): simulated with use a long handled sponge Lower Body Bathing: Moderate assistance;Sit to/from stand;With adaptive equipment;Cueing for compensatory techniques (with increased time) Lower Body Bathing Details (indicate cue type and reason): simulated with use a long handled sponge Upper Body Dressing : Contact guard assist;Sitting;Cueing for compensatory techniques (with increased time)   Lower Body Dressing: Minimal assistance;Sitting/lateral leans;Moderate assistance;Sit to/from stand;With adaptive equipment;Cueing for compensatory techniques (with increased time) Lower Body Dressing Details (indicate cue type and reason): with use of socak aid, reacher, and long handled shoe horn Toilet Transfer: Moderate assistance;BSC/3in1;Rollator (4 wheels) (step-pivot transfer; occaisonal cues for hand placement/technique) Toilet Transfer Details (indicate cue type and reason): Min assist to power up and Mod assist in stepping Toileting- Clothing Manipulation and Hygiene: Maximal assistance;Sit to/from stand;Cueing for compensatory techniques          General ADL Comments: OT educated pt in use of AE for UB and LB ADLs and in use of gait belt to assist pt in lifting legs for increased safety and independence with bed mobility with pt demonstrating understanding of all education through teach back. Pt will benefit from reinforcement of training to become more comfortable with use of AE. Pt reports all trialed AE is helpful. OT education pt on where AE can be purchased in the community. OT also provided pt with gait belt and foam for built up grips.    Extremity/Trunk Assessment Upper Extremity Assessment Upper Extremity Assessment: Left hand dominant;LUE deficits/detail;RUE deficits/detail RUE Deficits / Details: generalized weakness; impaired shoulder AROM at baseline with PROM limited to approx. 100 degrees flexion; decreased fine and gross motor coordination. Pt reports occasional numbenss, but none today. RUE Sensation: WNL RUE Coordination: decreased fine motor;decreased gross motor LUE Deficits / Details: generalized weakness; impaired shoulder AROM at baseline with PROM WFL; decreased fine and gross motor coordination, reports hx of shoulder "popping out" with issue not present this session. LUE Sensation: WNL LUE Coordination: decreased fine motor;decreased gross motor   Lower Extremity Assessment Lower Extremity Assessment: Defer to PT evaluation        Vision       Perception     Praxis      Cognition Arousal: Alert Behavior During Therapy: [facility] for tasks assessed/performed, Flat affect (Largely WNL; often with a flat affect but with occasional smiling and appropriate responses throughout.) Overall Cognitive Status: Within Functional Limits for tasks assessed                                 General Comments: AAOx4 and pleasant  throughout session. Pt demonstrates good insight into deficits and good safety awareness. Pt able to follow multi-step instructions with increased time.        Exercises       Shoulder Instructions       General Comments VSS on RA throughout session. OT and pt discussed acute discharge plan. Pt reports she feels she needs more rehab before going home and is concerned family is not able to provide the level of physical assist she currently needs. Pt reports she would like to go to intensive inpatient short-term rehab but family would like for her to discharge home with [facility] rehab. Pt reports she is concerned about having a set back or fall in the home if she goes home at current level of functioning. OT notified Case Manager, Social Worker, and RN of pt statements, preferences, and concerns, and also discussed pt current funcitonal level with PT.    Pertinent Vitals/ Pain       Pain Assessment Pain Assessment: PAINAD Faces Pain Scale: Hurts even more Pain Location: Left shoulder with movement Pain Descriptors / Indicators: Grimacing, Aching Pain Intervention(s): Limited activity within patient's tolerance, Monitored during session, Repositioned  Home Living                                          Prior Functioning/Environment              Frequency  Min 1X/week        Progress Toward Goals  OT Goals(current goals can now be found in the care plan section)  Progress towards OT goals: Progressing toward goals  Acute Rehab OT Goals Patient Stated Goal: to get stronger before going home and have less pain in her L shoulder  Plan      Co-evaluation                 AM-PAC OT "6 Clicks" Daily Activity     Outcome Measure   Help from another person eating meals?: A Little Help from another person taking care of personal grooming?: A Little Help from another person toileting, which includes using toliet, bedpan, or urinal?: A Lot Help from another person bathing (including washing, rinsing, drying)?: A Lot Help from another person to put on and taking off regular upper body clothing?: A Little Help from another person to  put on and taking off regular lower body clothing?: A Lot 6 Click Score: 15    End of Session Equipment Utilized During Treatment: Gait belt;Rollator (4 wheels);Other (comment) (long handled sponge, reacher, long handled shoe horn, sock aid, foam for built-up grips)  OT Visit Diagnosis: Other abnormalities of gait and mobility (R26.89);Unsteadiness on feet (R26.81);History of falling (Z91.81);Ataxia, unspecified (R27.0);Pain   Activity Tolerance Patient tolerated treatment well   Patient Left Other (comment);with call bell/phone within reach (Sitting on [facility])   Nurse Communication Mobility status;Other (comment) (Pt sitting on BSC and needs new PureWick. Pt's statements, reported preferences, and concerns regarding discharge plan.)        Time: 1002-1052 OT Time Calculation (min): 50 min  Charges: OT General Charges $OT Visit: 1 Visit OT Treatments $Self Care/Home Management : 38-52 mins  Clavin Ruhlman "Orson Eva., OTR/L, MA Acute Rehab [phone]   Lendon Colonel 07/17/2023, 11:22 AM

## 2023-07-17 NOTE — Plan of Care (Signed)
  Problem: Health Behavior/Discharge Planning: Goal: Ability to manage health-related needs will improve Outcome: Progressing   Problem: Self-Care: Goal: Ability to participate in self-care as condition permits will improve Outcome: Progressing   Problem: Nutrition: Goal: Risk of aspiration will decrease Outcome: Progressing   Problem: Health Behavior/Discharge Planning: Goal: Ability to manage health-related needs will improve Outcome: Progressing   Problem: Clinical Measurements: Goal: Ability to maintain clinical measurements within normal limits will improve Outcome: Progressing Goal: Respiratory complications will improve Outcome: Progressing Goal: Cardiovascular complication will be avoided Outcome: Progressing   Problem: Safety: Goal: Ability to remain free from injury will improve Outcome: Progressing

## 2023-07-17 NOTE — Progress Notes (Signed)
Physical Therapy Treatment Patient Details Name: Dana Parks MRN: [id number] DOB: Mar 07, 1986 Today's Date: 07/17/2023   History of Present Illness The pt is a 37 yo female presenting 12/5 with R-sided chest pain that radiated up her back. Pt found to have HTN emergency (216/144), undergoing continued work up for acute onset CHF, imaging negative for stroke. PMH includes: limited access to healthcare and compliance with medications, Cerebral Palsy with R-sided weakness, seizure, asthma, anemia, and HTN.    PT Comments  Patient resting in bed and agreeable to mobilize with therapy. Pt required mod-max assist with step-by-step cues for bed mobility to roll to Rt side and sit up to EOB. Pt able to maintain balance at EOB but required min assist to scoot anteriorly and place feet on floor. Cues for power up and pt required min assist to rise and stabilize at rollator. Pt reports preference to rollator however has poor control of quick moving rollator and requires brakes locked for safety. Pt fatigues quickly and chair provided behind for seated rest as trunk and knees flexing from fatigue. Discussed currently level of min-mod assist for functional mobility and various settings of follow up rehab (OPPT, HHPT, SNF). Pt verbalizes concerns regarding families ability to assist her physically and safely. PT stating preference for higher level of care to regain functional independence. This therapist agrees patient will benefit from continued inpatient follow up therapy, <3 hours/day. Recommendations update. Pt asked we inform her stepmom and discuss current assist pt requires to mobilize. Attempted to reach family via phone, no answer. Will continue to progress pt as able in acute setting.   If plan is discharge home, recommend the following: A lot of help with walking and/or transfers;A lot of help with bathing/dressing/bathroom;Assistance with cooking/housework;Direct supervision/assist for medications  management;Direct supervision/assist for financial management;Help with stairs or ramp for entrance   Can travel by private vehicle     No  Equipment Recommendations  Rollator (4 wheels);BSC/3in1;Wheelchair (measurements PT);Wheelchair cushion (measurements PT)    Recommendations for Other Services       Precautions / Restrictions Precautions Precautions: Fall Restrictions Weight Bearing Restrictions: No     Mobility  Bed Mobility Overal bed mobility: Needs Assistance Bed Mobility: Rolling, Sidelying to Sit Rolling: Mod assist, Used rails Sidelying to sit: Mod assist, Max assist, Used rails       General bed mobility comments: flat bed.to simulate home set up; pt with limited ability to reach Lt UE for rolling Rt and required assist to bring bil LE's off EOB and raise trunk.    Transfers Overall transfer level: Needs assistance Equipment used: Rollator (4 wheels), Rolling walker (2 wheels) Transfers: Sit to/from Stand Sit to Stand: Min assist           General transfer comment: Min assist to power up and stabilize balance in standing. Pt requires assist to lock rollator.    Ambulation/Gait Ambulation/Gait assistance: Mod assist Gait Distance (Feet): 5 Feet Assistive device: Rollator (4 wheels) Gait Pattern/deviations: Step-to pattern, Decreased step length - right, Decreased step length - left, Shuffle, Trunk flexed, Narrow base of support Gait velocity: decr     General Gait Details: Assist for balance and support. Pt fatigues quickly. pt sliding feet on floor to advance LE's with poor attempt at step clearance. posture flexed throughout. Pt c/o slight dizziness/lightheadedness, BP stable.   Stairs             Wheelchair Mobility     Tilt Bed    Modified Rankin (  Stroke Patients Only)       Balance Overall balance assessment: Needs assistance Sitting-balance support: Feet supported, Single extremity supported Sitting balance-Leahy Scale:  Poor   Postural control:  (flexed posture) Standing balance support: During functional activity, Bilateral upper extremity supported, Reliant on assistive device for balance Standing balance-Leahy Scale: Poor Standing balance comment: Rollator and initial mod assist for static standing                            Cognition Arousal: Alert Behavior During Therapy: Flat affect Overall Cognitive Status: Within Functional Limits for tasks assessed                                 General Comments: Incr time to process        Exercises      General Comments        Pertinent Vitals/Pain Pain Assessment Pain Assessment: Faces Faces Pain Scale: Hurts even more Pain Location: lt shoulder Pain Descriptors / Indicators: Grimacing, Aching Pain Intervention(s): Limited activity within patient's tolerance, Monitored during session, Repositioned    Home Living                          Prior Function            PT Goals (current goals can now be found in the care plan section) Acute Rehab PT Goals Patient Stated Goal: to be able to walk on her own PT Goal Formulation: With patient Time For Goal Achievement: 07/26/23 Potential to Achieve Goals: Fair Progress towards PT goals: Progressing toward goals    Frequency    Min 1X/week      PT Plan      Co-evaluation              AM-PAC PT "6 Clicks" Mobility   Outcome Measure  Help needed turning from your back to your side while in a flat bed without using bedrails?: A Lot Help needed moving from lying on your back to sitting on the side of a flat bed without using bedrails?: A Lot Help needed moving to and from a bed to a chair (including a wheelchair)?: A Lot Help needed standing up from a chair using your arms (e.g., wheelchair or bedside chair)?: A Little Help needed to walk in hospital room?: Total Help needed climbing 3-5 steps with a railing? : Total 6 Click Score: 11     End of Session Equipment Utilized During Treatment: Gait belt Activity Tolerance: Patient limited by fatigue Patient left: in chair;with call bell/phone within reach;with chair alarm set Nurse Communication: Mobility status PT Visit Diagnosis: Unsteadiness on feet (R26.81);Muscle weakness (generalized) (M62.81);Pain Pain - Right/Left: Left Pain - part of body: Shoulder     Time: 0981-1914 PT Time Calculation (min) (ACUTE ONLY): 29 min  Charges:    $Gait Training: 8-22 mins $Therapeutic Activity: 8-22 mins PT General Charges $$ ACUTE PT VISIT: 1 Visit                     Wynn Maudlin, DPT Acute Rehabilitation Services Office [phone]  07/17/23 9:58 AM

## 2023-07-17 NOTE — Progress Notes (Addendum)
PROGRESS NOTE    CYNTIA BESSON  WUJ:[id number] DOB: 1985-09-26 DOA: 07/11/2023 PCP: Fleet Contras, MD  Chief Complaint  Patient presents with   Chest Pain   Shortness of Breath   Dizziness   Palpitations    Brief Narrative:   37 year old female with cerebral palsy with right-sided weakness, seizure disorder, asthma, anemia, HTN who comes into the hospital with chest pain as well as dizziness. This happened the day prior to admission. She reports history of elevated blood pressure however she is in between doctors, just establish care and has run out of all her medications. She told me she recently went to urgent care and was prescribed amlodipine 5 mg, however felt like this was not enough and she used to take more in the past. In the ED she was found to have some evidence of fluid overload based on the CT scan of the chest, was significantly hypertensive, and she was admitted to the hospital   Assessment & Plan:   Principal Problem:   Hypertensive emergency Active Problems:   Hypertensive urgency  Addendum C/o HA this evening followed by nausea and vomiting -> concern for migraine.  Vitals appropriate, HR 78, o2 sat normal on RA, BP 119/77, RR 18.  She c/o SOB -> will follow CXR (pulm exam not particularly impressive, though difficult with her intermittently vomiting).  She's had some small volume blood in her mouth intermittently - RN noted presence earlier today, but improved after brushing her teeth, unclear source (oral vs nasal vs hematemesis? Vs other? Suspect oral, but for now will start PPI and monitor closely).  Called stepmother again for history, but no answer.   Hypertensive emergency-patient was admitted to the hospital with significantly elevated blood pressure and some hazy infiltrates on the lung CT possibly representing edema.  BNP is significantly elevated, along with the troponins.  She also has LFT elevation.  CT and MRI of the brain without acute  findings -Cardiology consulted, appreciate input.  She was placed on 10 mg of amlodipine, 50 mg losartan, 50 mg of spironolactone, however became hypotensive and amlodipine was cut in half at 5 mg.  BP on soft side.  Will decrease spironolactone again.    Shoulder pain-unclear significance, tells me she has this once in [place] while at home and usually gets better with conservative management.  Obtain x-ray   Elevated troponin-likely in the setting of hypertensive emergency, flat, not in Dontasia Miranda pattern consistent with ACS.  Chest pain is improving with improvement in her blood pressure.  2D echo showed normal LVEF, severe LVH and grade 2 diastolic dysfunction.  RV was normal   AKI-due to Lasix, creatinine peaked to 1.7, better this morning at 1.3.   Acute on chronic diastolic CHF-with some evidence of fluid overload on admission, status post Lasix.  Currently on spironolactone.  Appears euvolemic   Hyponatremia Will trend for now, spironolactone was reduced yesterday - reduce again today  Hypokalemia, hypomagnesemia-replace and continue to monitor   Elevated LFTs-possibly due to #1.  Improving, hepatitis panel negative   History of seizures-continue Keppra   Cerebral palsy-noted.  She tells me that she has difficulties ambulating at baseline, has no wheelchair or walker.  Has had occasional falls and when she is down she is very difficult for her to get up.  PT recommends SNF, patient agreeable -> note today pending.  Per TOC note, it appears they've talked about her returning home.  Will plan for possible d/c tmrw.    History  of asthma-no wheezing, stable   Anemia-anemia panel unremarkable, b12 low normal being replaced    DVT prophylaxis: heparin  Code Status: full Family Communication: called stepmother, no answer Disposition:   Status is: Inpatient Remains inpatient appropriate because: awaiting safe discharge   Consultants:  cardiology  Procedures:  Echo IMPRESSIONS     1. Left  ventricular ejection fraction, by estimation, is 60 to 65%. The  left ventricle has normal function. The left ventricle has no regional  wall motion abnormalities. There is severe concentric left ventricular  hypertrophy. Left ventricular diastolic   parameters are consistent with Grade II diastolic dysfunction  (pseudonormalization).   2. Right ventricular systolic function is normal. The right ventricular  size is normal.   3. The mitral valve is grossly normal. Mild mitral valve regurgitation.   4. Tricuspid valve regurgitation is mild to moderate.   5. The aortic valve is grossly normal. Aortic valve regurgitation is not  visualized.   Conclusion(s)/Recommendation(s): Signs of hypertensive heart disease,  elevated LVEDP.    Antimicrobials:  Anti-infectives (From admission, onward)    None       Subjective: C/o being cold, asks for Fani Rotondo blanket  Objective: Vitals:   07/16/23 2340 07/17/23 0340 07/17/23 0727 07/17/23 1115  BP: 94/67 96/71 107/71 108/67  Pulse: 76 79 78 (!) 25  Resp: 14 14 14 17   Temp: 98.1 F (36.7 C) 98.4 F (36.9 C) 98.2 F (36.8 C) 98.5 F (36.9 C)  TempSrc: Oral Oral Oral Oral  SpO2: 100% 100% 100% 100%  Weight:  56.1 kg    Height:        Intake/Output Summary (Last 24 hours) at 07/17/2023 1651 Last data filed at 07/17/2023 1339 Gross per 24 hour  Intake 960 ml  Output 1150 ml  Net -190 ml   Filed Weights   07/15/23 0433 07/16/23 0341 07/17/23 0340  Weight: 57 kg 54.1 kg 56.1 kg    Examination:  General exam: Appears calm and comfortable  Respiratory system: unlabored Cardiovascular system: RRR Central nervous system: Alert.  Extremities: no LEE    Data Reviewed: I have personally reviewed following labs and imaging studies  CBC: Recent Labs  Lab 07/11/23 2058 07/12/23 0432 07/13/23 0311 07/14/23 0426 07/17/23 0759  WBC 3.7* 5.2 5.7 5.7 3.8*  HGB 8.5* 9.1* 8.9* 9.5* 10.2*  HCT 30.1* 30.9* 30.2* 32.9* 34.6*  MCV 75.3*  73.7* 73.7* 75.1* 74.6*  PLT 296 335 343 370 365    Basic Metabolic Panel: Recent Labs  Lab 07/12/23 0432 07/13/23 0311 07/14/23 0426 07/15/23 1047 07/16/23 0724 07/17/23 0759  NA 130* 131* 132* 132* 129* 127*  K 2.8* 4.8 4.2 5.0 4.7 4.8  CL 93* 96* 95* 96* 95* 94*  CO2 25 24 24 25 23 25   GLUCOSE 110* 121* 102* 105* 85 92  BUN 10 20 24* 38* 42* 32*  CREATININE 0.70 1.34* 1.00 1.79* 1.31* 1.38*  CALCIUM 9.0 9.1 9.5 9.5 9.2 9.5  MG 1.6* 1.8 2.1  --   --  2.4  PHOS 4.0  --   --   --   --   --     GFR: Estimated Creatinine Clearance: 42.2 mL/min (Kaesen Rodriguez) (by C-G formula based on SCr of 1.38 mg/dL (H)).  Liver Function Tests: Recent Labs  Lab 07/11/23 2314 07/12/23 0432 07/13/23 0311 07/14/23 0426 07/17/23 0759  AST 202* 193* 130* 113* 109*  ALT 108* 109* 84* 80* 64*  ALKPHOS 75 75 65 68 71  BILITOT 0.7  0.8 0.8 1.0 0.6  PROT 9.7* 10.2* 9.3* 10.0* 9.3*  ALBUMIN 3.0* 3.1* 2.9* 3.1* 2.9*    CBG: Recent Labs  Lab 07/16/23 0727 07/16/23 1102 07/16/23 1602 07/16/23 2117 07/17/23 0726  GLUCAP 85 98 87 112* 91     No results found for this or any previous visit (from the past 240 hour(s)).       Radiology Studies: DG Shoulder Left Port  Result Date: 07/16/2023 CLINICAL DATA:  Acute left shoulder pain without known injury. EXAM: LEFT SHOULDER COMPARISON:  May 13, 2017. FINDINGS: There is no evidence of fracture or dislocation. There is no evidence of arthropathy or other focal bone abnormality. Soft tissues are unremarkable. IMPRESSION: Negative. Electronically Signed   By: Lupita Raider M.D.   On: 07/16/2023 12:55        Scheduled Meds:  amLODipine  5 mg Oral Daily   carvedilol  25 mg Oral BID WC   vitamin B-12  1,000 mcg Oral Daily   ferrous sulfate  325 mg Oral Q breakfast   heparin  5,000 Units Subcutaneous Q8H   levETIRAcetam  1,000 mg Oral BID   losartan  50 mg Oral Daily   polyethylene glycol  17 g Oral BID   senna-docusate  1 tablet Oral BID    spironolactone  25 mg Oral Daily   Continuous Infusions:   LOS: 5 days    Time spent: over 30 min     Lacretia Nicks, MD [facility]   To contact the attending provider between 7A-7P or the covering provider during after hours 7P-7A, please log into the web site [email or web address] and access using universal [place] password for that web site. If you do not have the password, please call the hospital operator.  07/17/2023, 4:51 PM

## 2023-07-17 NOTE — Progress Notes (Signed)
Dr. Lowell Guitar made aware of patient's 10/10 headache, vomiting, reports of difficulty breathing, and blood in mouth. He came to bedside to assess patient.

## 2023-07-17 NOTE — TOC Progression Note (Addendum)
Transition of Care [facility]) - Progression Note    Patient Details  Name: Dana Parks MRN: [id number] Date of Birth: 12/12/1985  [facility] CM/SW Contact  Delilah Shan, LCSWA Phone Number: 07/17/2023, 11:49 AM  Clinical Narrative:     CSW received consult patient may be interested in going to SNF at dc.CSW followed up with patient at bedside to confirm dc plan. Patient request for CSW to call her stepmother to discuss plan. Patient,Stepmother, and CSW discussed PT recs for SNF vrs going home. All questions answered. Patient informed CSW she feels safe to return home. Patient confirmed with CSW she would like to return home when medically ready. Patients stepmother in agreement with plan for patient to return home. Patients stepmother said she will pick patient up when medically ready for dc.No further questions reported at this time.CSW informed CM.   Expected Discharge Plan: Home w Home Health Services Barriers to Discharge: Continued Medical Work up  Expected Discharge Plan and Services In-house Referral: Clinical Social Work   Post Acute Care Choice: Skilled Nursing Facility Living arrangements for the past 2 months: Apartment                 DME Arranged: Walker rolling with seat DME Agency: [employer]       HH Arranged: Refused HH           Social Determinants of Health (SDOH) Interventions SDOH Screenings   Food Insecurity: No Food Insecurity (07/12/2023)  Housing: Low Risk  (07/12/2023)  Transportation Needs: No Transportation Needs (07/12/2023)  Utilities: Not At Risk (07/12/2023)  Tobacco Use: Medium Risk (07/11/2023)    Readmission Risk Interventions     No data to display

## 2023-07-18 ENCOUNTER — Other Ambulatory Visit (HOSPITAL_COMMUNITY): Payer: Self-pay

## 2023-07-18 DIAGNOSIS — I161 Hypertensive emergency: Secondary | ICD-10-CM | POA: Diagnosis not present

## 2023-07-18 LAB — PHOSPHORUS: Phosphorus: 4.2 mg/dL (ref 2.5–4.6)

## 2023-07-18 LAB — CBC
HCT: 34.4 % — ABNORMAL LOW (ref 36.0–46.0)
Hemoglobin: 10 g/dL — ABNORMAL LOW (ref 12.0–15.0)
MCH: 21.8 pg — ABNORMAL LOW (ref 26.0–34.0)
MCHC: 29.1 g/dL — ABNORMAL LOW (ref 30.0–36.0)
MCV: 74.9 fL — ABNORMAL LOW (ref 80.0–100.0)
Platelets: 415 10*3/uL — ABNORMAL HIGH (ref 150–400)
RBC: 4.59 MIL/uL (ref 3.87–5.11)
RDW: 18.6 % — ABNORMAL HIGH (ref 11.5–15.5)
WBC: 3.4 10*3/uL — ABNORMAL LOW (ref 4.0–10.5)
nRBC: 0 % (ref 0.0–0.2)

## 2023-07-18 LAB — BASIC METABOLIC PANEL
Anion gap: 11 (ref 5–15)
BUN: 30 mg/dL — ABNORMAL HIGH (ref 6–20)
CO2: 23 mmol/L (ref 22–32)
Calcium: 9.6 mg/dL (ref 8.9–10.3)
Chloride: 96 mmol/L — ABNORMAL LOW (ref 98–111)
Creatinine, Ser: 1.17 mg/dL — ABNORMAL HIGH (ref 0.44–1.00)
GFR, Estimated: >60 mL/min (ref >60)
Glucose, Bld: 96 mg/dL (ref 70–99)
Potassium: 4.6 mmol/L (ref 3.5–5.1)
Sodium: 130 mmol/L — ABNORMAL LOW (ref 135–145)

## 2023-07-18 LAB — GLUCOSE, CAPILLARY: Glucose-Capillary: 111 mg/dL — ABNORMAL HIGH (ref 70–99)

## 2023-07-18 LAB — MAGNESIUM: Magnesium: 2.5 mg/dL — ABNORMAL HIGH (ref 1.7–2.4)

## 2023-07-18 MED ORDER — LEVETIRACETAM 750 MG PO TABS: 750.0000 mg | ORAL_TABLET | Freq: Two times a day (BID) | ORAL | 2 refills | Status: DC

## 2023-07-18 MED ORDER — CYANOCOBALAMIN 1000 MCG PO TABS
1000.0000 ug | ORAL_TABLET | Freq: Every day | ORAL | 0 refills | Status: AC
  Filled 2023-07-18 (×3): qty 30, 30d supply, fill #0

## 2023-07-18 MED ORDER — FERROUS SULFATE 325 (65 FE) MG PO TABS
325.0000 mg | ORAL_TABLET | ORAL | 0 refills | Status: DC
  Filled 2023-07-18: qty 15, 30d supply, fill #0
  Filled 2023-07-18: qty 45, 90d supply, fill #0

## 2023-07-18 MED ORDER — AMLODIPINE BESYLATE 5 MG PO TABS
5.0000 mg | ORAL_TABLET | Freq: Every day | ORAL | 0 refills | Status: DC
  Filled 2023-07-18: qty 90, 90d supply, fill #0

## 2023-07-18 MED ORDER — LOSARTAN POTASSIUM 50 MG PO TABS
50.0000 mg | ORAL_TABLET | Freq: Every day | ORAL | 0 refills | Status: DC
  Filled 2023-07-18: qty 90, 90d supply, fill #0

## 2023-07-18 MED ORDER — SPIRONOLACTONE 25 MG PO TABS
12.5000 mg | ORAL_TABLET | Freq: Every day | ORAL | 0 refills | Status: DC
  Filled 2023-07-18: qty 45, 90d supply, fill #0

## 2023-07-18 MED ORDER — PANTOPRAZOLE SODIUM 40 MG PO TBEC: 40.0000 mg | DELAYED_RELEASE_TABLET | Freq: Two times a day (BID) | ORAL | Status: DC

## 2023-07-18 MED ORDER — CARVEDILOL 25 MG PO TABS
25.0000 mg | ORAL_TABLET | Freq: Two times a day (BID) | ORAL | 0 refills | Status: DC
  Filled 2023-07-18: qty 180, 90d supply, fill #0

## 2023-07-18 NOTE — Discharge Summary (Signed)
Physician Discharge Summary  Dana Parks CBJ:[id number] DOB: 1986/05/20 DOA: 07/11/2023  PCP: Fleet Contras, MD  Admit date: 07/11/2023 Discharge date: 07/18/2023  Time spent: 40 minutes  Recommendations for Outpatient Follow-up:  Follow outpatient CBC/CMP  Follow blood pressure with cards outpatient Follow with neurology outpatient (keppra adjusted) Needs repeat chest imaging outpatient Follow anemia outpatient - b12, iron supplementation  Bleeding from gums, needs outpatient follow up - dental, PCP further w/u  Discharge Diagnoses:  Principal Problem:   Hypertensive emergency Active Problems:   Hypertensive urgency   Discharge Condition: stable  Diet recommendation: heart healthy  Filed Weights   07/16/23 0341 07/17/23 0340 07/18/23 0500  Weight: 54.1 kg 56.1 kg 55.4 kg    History of present illness:   37 year old female with cerebral palsy with right-sided weakness, seizure disorder, asthma, anemia, HTN who comes into the hospital with chest pain as well as dizziness. This happened the day prior to admission. She reports history of elevated blood pressure however she is in between doctors, just establish care and has run out of all her medications. She told me she recently went to urgent care and was prescribed amlodipine 5 mg, however felt like this was not enough and she used to take more in the past. In the ED she was found to have some evidence of fluid overload based on the CT scan of the chest, was significantly hypertensive, and she was admitted to the hospital.  She was seen by cardiology.  She was treated for hypertensive emergency.  Started on antihypertensive regimen.  She's gradually improved.  Will have outpatient cardiology follow up.    Hospital Course:  Assessment and Plan:  Hypertensive emergency-patient was admitted to the hospital with significantly elevated blood pressure and some hazy infiltrates on the lung CT possibly representing edema.  BNP is  significantly elevated, along with the troponins.  She also has LFT elevation.  CT and MRI of the brain without acute findings -Cardiology consulted, appreciate input.  She was placed on 10 mg of amlodipine, 50 mg losartan, 50 mg of spironolactone, however became hypotensive and amlodipine was cut in half at 5 mg.  BP on soft side.  Will decrease spironolactone again.  Stable for discharge today.  Will have outpatient cards follow up.   Migraine Improved after migraine cocktail yesterday  Bleeding from gums Follow with dental outpatient   Shoulder pain-unclear significance, tells me she has this once in [place] while at home and usually gets better with conservative management.  Obtain x-ray -> negative.   Elevated troponin-likely in the setting of hypertensive emergency, flat, not in [place] pattern consistent with ACS.  Chest pain is improving with improvement in her blood pressure.  2D echo showed normal LVEF, severe LVH and grade 2 diastolic dysfunction.  RV was normal.  Plan for outpatient cards follow up.    AKI-due to Lasix, creatinine peaked to 1.7, better this morning at 1.3.   Acute on chronic diastolic CHF-with some evidence of fluid overload on admission, status post Lasix.  Currently on spironolactone.  Appears euvolemic   Hyponatremia Improved, follow outpatient    Hypokalemia, hypomagnesemia-replace and continue to monitor   Elevated LFTs-possibly due to #1.  Improving, hepatitis panel negative - follow outpatient    History of seizures-continue Keppra (will reduce dose on discharge due to renal function)   Cerebral palsy-noted.  She tells me that she has difficulties ambulating at baseline, has no wheelchair or walker.  Has had occasional falls and when she  is down she is very difficult for her to get up.  PT recommends SNF, patient agreeable -> per TOC note, it appears they've talked about her returning home (note from 12/11 mentions plan to return home with stepmother).  Stable for  discharge today.   History of asthma-no wheezing, stable   Anemia-anemia panel unremarkable, b12 low normal being replaced    Procedures: Echo IMPRESSIONS     1. Left ventricular ejection fraction, by estimation, is 60 to 65%. The  left ventricle has normal function. The left ventricle has no regional  wall motion abnormalities. There is severe concentric left ventricular  hypertrophy. Left ventricular diastolic   parameters are consistent with Grade II diastolic dysfunction  (pseudonormalization).   2. Right ventricular systolic function is normal. The right ventricular  size is normal.   3. The mitral valve is grossly normal. Mild mitral valve regurgitation.   4. Tricuspid valve regurgitation is mild to moderate.   5. The aortic valve is grossly normal. Aortic valve regurgitation is not  visualized.   Conclusion(s)/Recommendation(s): Signs of hypertensive heart disease,  elevated LVEDP.    Consultations: cardiology  Discharge Exam: Vitals:   07/18/23 0349 07/18/23 0755  BP: 112/77 127/80  Pulse: 74 72  Resp: 14 13  Temp: 98.6 F (37 C) (!) 97.4 F (36.3 C)  SpO2: 100% 100%   No complaints Discussed discharge plan with stepmother   General: No acute distress. Cardiovascular: RRR Lungs: unlabored Neurological: Alert. Moves all extremities 4. Cranial nerves II through XII grossly intact. Extremities: No clubbing or cyanosis. No edema.  Discharge Instructions   Discharge Instructions     Call MD for:  difficulty breathing, headache or visual disturbances   Complete by: As directed    Call MD for:  extreme fatigue   Complete by: As directed    Call MD for:  hives   Complete by: As directed    Call MD for:  persistant dizziness or light-headedness   Complete by: As directed    Call MD for:  persistant nausea and vomiting   Complete by: As directed    Call MD for:  redness, tenderness, or signs of infection (pain, swelling, redness, odor or green/yellow  discharge around incision site)   Complete by: As directed    Call MD for:  severe uncontrolled pain   Complete by: As directed    Call MD for:  temperature >100.4   Complete by: As directed    Diet - low sodium heart healthy   Complete by: As directed    Discharge instructions   Complete by: As directed    You were seen for issues due to elevated blood pressures.   We've started you on losartan and spironolactone.  You've also been started on coreg.  You should continue your home amlodipine.  You'll need cardiology follow up outpatient.   We recommended [facility] skilled nursing facility, but placement was difficult due to your prior history.  We recommended home health services, but these were declined.  We'll send you home with outpatient therapy to your stepmother's house.  Continue your keppra, I've adjusted the dose based on your kidney function.  Follow with your outpatient providers regarding this.   You'll need repeat labs outpatient.  You should have repeat chest imaging outpatient.  You've had some gum bleeding, please follow up with dental outpatient.   Return for new, recurrent, or worsening symptoms.  Please ask your PCP to request records from this hospitalization so they know  what was done and what the next steps will be.   Increase activity slowly   Complete by: As directed       Allergies as of 07/18/2023       Reactions   Shrimp [shellfish Allergy] Anaphylaxis   Can eat other shellfish   Codeine Other (See Comments)   GI upset   Dust Mite Extract Other (See Comments)   Unknown reaction   Mold Extract [trichophyton] Other (See Comments)   Unknown reaction   Penicillins Hives, Other (See Comments)   Has patient had Eason Housman PCN reaction causing immediate rash, facial/tongue/throat swelling, SOB or lightheadedness with hypotension: No Has patient had Kandise Riehle PCN reaction causing severe rash involving mucus membranes or skin necrosis: No Has patient had Eliska Hamil PCN reaction that  required hospitalization: Yes Has patient had Gryffin Altice PCN reaction occurring within the last 10 years: No If all of the above answers are "NO", then may proceed with Cephalosporin use. Unknown reaction; pt has gotten cefazolin   Soap Other (See Comments)   Unknown reaction        Medication List     TAKE these medications    albuterol 108 (90 Base) MCG/ACT inhaler Commonly known as: VENTOLIN HFA Inhale 2 puffs into the lungs every 6 (six) hours as needed for wheezing (wheezing). What changed: reasons to take this   amLODipine 5 MG tablet Commonly known as: NORVASC Take 1 tablet (5 mg total) by mouth at bedtime.   budesonide-formoterol 160-4.5 MCG/ACT inhaler Commonly known as: SYMBICORT Inhale 2 puffs into the lungs 2 (two) times daily. Rinse mouth with water after each use   carvedilol 25 MG tablet Commonly known as: COREG Take 1 tablet (25 mg total) by mouth 2 (two) times daily with Halil Rentz meal.   cyanocobalamin 1000 MCG tablet Take 1 tablet (1,000 mcg total) by mouth daily. Start taking on: July 19, 2023   ferrous sulfate 325 (65 FE) MG tablet Take 1 tablet (325 mg total) by mouth every other day.   fluticasone 50 MCG/ACT nasal spray Commonly known as: FLONASE Place 1 spray into both nostrils in the morning and at bedtime. What changed:  when to take this reasons to take this   ibuprofen 800 MG tablet Commonly known as: ADVIL Take 1 tablet (800 mg total) by mouth every 8 (eight) hours as needed for moderate pain. What changed: when to take this   levETIRAcetam 750 MG tablet Commonly known as: Keppra Take 1 tablet (750 mg total) by mouth 2 (two) times daily. Follow up with your outpatient neurologist, we adjusted this dose based on your kidney function What changed:  medication strength how much to take additional instructions   losartan 50 MG tablet Commonly known as: COZAAR Take 1 tablet (50 mg total) by mouth daily. Start taking on: July 19, 2023    spironolactone 25 MG tablet Commonly known as: ALDACTONE Take 0.5 tablets (12.5 mg total) by mouth daily. Start taking on: July 19, 2023               Durable Medical Equipment  (From admission, onward)           Start     Ordered   07/16/23 1340  For home use only DME 4 wheeled rolling walker with seat  Once       Question:  Patient needs Tidus Upchurch walker to treat with the following condition  Answer:  Weakness   07/16/23 1340  Allergies  Allergen Reactions   Shrimp [Shellfish Allergy] Anaphylaxis    Can eat other shellfish   Codeine Other (See Comments)    GI upset   Dust Mite Extract Other (See Comments)    Unknown reaction   Mold Extract [Trichophyton] Other (See Comments)    Unknown reaction   Penicillins Hives and Other (See Comments)    Has patient had Torrey Horseman PCN reaction causing immediate rash, facial/tongue/throat swelling, SOB or lightheadedness with hypotension: No Has patient had Tesneem Dufrane PCN reaction causing severe rash involving mucus membranes or skin necrosis: No Has patient had Otho Michalik PCN reaction that required hospitalization: Yes Has patient had Avelyn Touch PCN reaction occurring within the last 10 years: No If all of the above answers are "NO", then may proceed with Cephalosporin use.  Unknown reaction; pt has gotten cefazolin   Soap Other (See Comments)    Unknown reaction    Follow-up Information     Alver Sorrow, NP Follow up.   Specialty: Cardiology Why: Follow-up with Cardiology scheduled for 08/09/2023 at 10:55am. Please arrive 15 minutes early for check-in. If this date/ time does not work for you, please call our office to reschedule. Contact information: [address] [place] [address] [phone]         [facility] Follow up.   Specialty: Rehabilitation Why: they will call you to schedule your appointments for PT OT and Speech Therapy Contact information: [address] [place] [address] [phone]                 The results of significant diagnostics from this hospitalization (including imaging, microbiology, ancillary and laboratory) are listed below for reference.    Significant Diagnostic Studies: DG CHEST PORT 1 VIEW Result Date: 07/17/2023 CLINICAL DATA:  Shortness of breath EXAM: PORTABLE CHEST 1 VIEW COMPARISON:  07/11/2023 FINDINGS: Cardiac shadow is within normal limits. Right lung is clear. Left lung demonstrates some very mild left basilar atelectasis. No sizable effusion is seen. No bony abnormality is noted. IMPRESSION: Mild left basilar atelectasis. Electronically Signed   By: Alcide Clever M.D.   On: 07/17/2023 23:03   DG Shoulder Left Port Result Date: 07/16/2023 CLINICAL DATA:  Acute left shoulder pain without known injury. EXAM: LEFT SHOULDER COMPARISON:  May 13, 2017. FINDINGS: There is no evidence of fracture or dislocation. There is no evidence of arthropathy or other focal bone abnormality. Soft tissues are unremarkable. IMPRESSION: Negative. Electronically Signed   By: Lupita Raider M.D.   On: 07/16/2023 12:55   US RENAL Result Date: 07/14/2023 CLINICAL DATA:  Hypertension. EXAM: RENAL / URINARY TRACT ULTRASOUND COMPLETE COMPARISON:  CT abdomen and pelvis dated 07/12/2023. FINDINGS: Right Kidney: Renal measurements: 10.2 x 4.5 x 4.1 cm = volume: 99 mL. Echogenicity within normal limits. No mass or hydronephrosis visualized. Left Kidney: Renal measurements: 10.2 x 5.2 x 4.0 cm = volume: 111 mL. Echogenicity within normal limits. No mass or hydronephrosis visualized. Bladder: Appears normal for degree of bladder distention. Other: None. IMPRESSION: Normal renal ultrasound. Electronically Signed   By: Romona Curls M.D.   On: 07/14/2023 12:58   ECHOCARDIOGRAM COMPLETE Result Date: 07/12/2023    ECHOCARDIOGRAM REPORT   Patient Name:   Dana Parks Date of Exam: 07/12/2023 Medical Rec #:  [id number]        Height:       59.0 in Accession #:    [id number]      Weight:  155.0 lb Date of Birth:  [birth year]       BSA:          1.655 m Patient Age:    37 years        BP:           142/115 mmHg Patient Gender: F               HR:           96 bpm. Exam Location:  Inpatient Procedure: 2D Echo, Color Doppler, Cardiac Doppler and Intracardiac            Opacification Agent Indications:    CHF  History:        Patient has no prior history of Echocardiogram examinations.                 Risk Factors:Hypertension.  Sonographer:    Milbert Coulter Referring Phys: [id number] SARA-MAIZ Yamili Lichtenwalner THOMAS IMPRESSIONS  1. Left ventricular ejection fraction, by estimation, is 60 to 65%. The left ventricle has normal function. The left ventricle has no regional wall motion abnormalities. There is severe concentric left ventricular hypertrophy. Left ventricular diastolic  parameters are consistent with Grade II diastolic dysfunction (pseudonormalization).  2. Right ventricular systolic function is normal. The right ventricular size is normal.  3. The mitral valve is grossly normal. Mild mitral valve regurgitation.  4. Tricuspid valve regurgitation is mild to moderate.  5. The aortic valve is grossly normal. Aortic valve regurgitation is not visualized. Conclusion(s)/Recommendation(s): Signs of hypertensive heart disease, elevated LVEDP. FINDINGS  Left Ventricle: Left ventricular ejection fraction, by estimation, is 60 to 65%. The left ventricle has normal function. The left ventricle has no regional wall motion abnormalities. Definity contrast agent was given IV to delineate the left ventricular  endocardial borders. The left ventricular internal cavity size was normal in size. There is severe concentric left ventricular hypertrophy. Left ventricular diastolic parameters are consistent with Grade II diastolic dysfunction (pseudonormalization). Right Ventricle: The right ventricular size is normal. Right ventricular systolic function is normal. Left  Atrium: Left atrial size was normal in size. Right Atrium: Right atrial size was normal in size. Pericardium: There is no evidence of pericardial effusion. Mitral Valve: The mitral valve is grossly normal. Mild mitral valve regurgitation. Tricuspid Valve: Tricuspid valve regurgitation is mild to moderate. Aortic Valve: The aortic valve is grossly normal. Aortic valve regurgitation is not visualized. Aortic valve mean gradient measures 3.0 mmHg. Aortic valve peak gradient measures 6.2 mmHg. Aortic valve area, by VTI measures 1.99 cm. Pulmonic Valve: Pulmonic valve regurgitation is not visualized. Aorta: The aortic root and ascending aorta are structurally normal, with no evidence of dilitation. IAS/Shunts: No atrial level shunt detected by color flow Doppler.  LEFT VENTRICLE PLAX 2D LVIDd:         3.80 cm   Diastology LVIDs:         2.50 cm   LV e' medial:    6.53 cm/s LV PW:         1.50 cm   LV E/e' medial:  14.0 LV IVS:        1.30 cm   LV e' lateral:   6.53 cm/s LVOT diam:     1.70 cm   LV E/e' lateral: 14.0 LV SV:         42 LV SV Index:   25 LVOT Area:     2.27 cm  RIGHT VENTRICLE RV Basal diam:  2.80 cm RV Mid diam:  2.00 cm RV S prime:     11.00 cm/s TAPSE (M-mode): 1.8 cm LEFT ATRIUM             Index        RIGHT ATRIUM           Index LA diam:        3.50 cm 2.11 cm/m   RA Area:     11.90 cm LA Vol (A2C):   50.5 ml 30.51 ml/m  RA Volume:   24.20 ml  14.62 ml/m LA Vol (A4C):   38.9 ml 23.50 ml/m LA Biplane Vol: 46.1 ml 27.85 ml/m  AORTIC VALVE AV Area (Vmax):    2.01 cm AV Area (Vmean):   1.84 cm AV Area (VTI):     1.99 cm AV Vmax:           124.00 cm/s AV Vmean:          84.100 cm/s AV VTI:            0.209 m AV Peak Grad:      6.2 mmHg AV Mean Grad:      3.0 mmHg LVOT Vmax:         110.00 cm/s LVOT Vmean:        68.100 cm/s LVOT VTI:          0.183 m LVOT/AV VTI ratio: 0.88  AORTA Ao Root diam: 2.50 cm Ao Asc diam:  2.80 cm MITRAL VALVE               TRICUSPID VALVE MV Area (PHT): 6.27 cm     TR Peak grad:   44.6 mmHg MV Decel Time: 121 msec    TR Vmax:        334.00 cm/s MV E velocity: 91.20 cm/s MV Tish Begin velocity: 58.20 cm/s  SHUNTS MV E/Nghia Mcentee ratio:  1.57        Systemic VTI:  0.18 m                            Systemic Diam: 1.70 cm Carolan Clines Electronically signed by Carolan Clines Signature Date/Time: 07/12/2023/9:04:55 AM    Final    MR BRAIN WO CONTRAST Result Date: 07/12/2023 CLINICAL DATA:  37 year old female with cerebral palsy. Headache, dizziness, tunnel vision, right arm paresthesia. TIA. EXAM: MRI HEAD WITHOUT CONTRAST TECHNIQUE: Multiplanar, multiecho pulse sequences of the brain and surrounding structures were obtained without intravenous contrast. COMPARISON:  CTA head and neck, head CT earlier today. Brain MRI 12/21/2016. FINDINGS: Brain: No restricted diffusion to suggest acute infarction. No midline shift, mass effect, evidence of mass lesion, ventriculomegaly, extra-axial collection or acute intracranial hemorrhage. Cervicomedullary junction and pituitary are within normal limits. Chronic dysplastic appearance of the occipital horns (series 11, image 32) is stable and compatible with neonatal periventricular leukomalacia. But patchy bilateral periventricular white matter T2 and FLAIR hyperintensity appears mildly progressed since 2018 (near the right frontal horn series 11, image 32) and is nonspecific. Chronic volume loss in the posterior corpus callosum unchanged. And no temporal lobe white matter involvement. No superimposed cortical encephalomalacia or convincing chronic cerebral blood products. Deep gray matter nuclei, brainstem and cerebellum remain within normal limits. Vascular: Major intracranial vascular flow voids are stable. Skull and upper cervical spine: Negative. Visualized bone marrow signal is within normal limits. Sinuses/Orbits: Postoperative changes to the right globe since 2018. Paranasal sinuses and mastoids are stable and well aerated. Other: Grossly normal visible  internal  auditory structures. Negative visible scalp and face. IMPRESSION: 1. No acute intracranial abnormality. 2. Sequelae of neonatal periventricular leukomalacia. Mildly progressed but nonspecific additional periventricular white matter signal changes since Marquee Fuchs 2018 MRI. Electronically Signed   By: Odessa Fleming M.D.   On: 07/12/2023 06:20   CT ANGIO HEAD NECK W WO CM Result Date: 07/12/2023 CLINICAL DATA:  37 year old female with neurologic deficit. EXAM: CT ANGIOGRAPHY HEAD AND NECK TECHNIQUE: Multidetector CT imaging of the head and neck was performed using the standard protocol during bolus administration of intravenous contrast. Multiplanar CT image reconstructions and MIPs were obtained to evaluate the vascular anatomy. Carotid stenosis measurements (when applicable) are obtained utilizing NASCET criteria, using the distal internal carotid diameter as the denominator. RADIATION DOSE REDUCTION: This exam was performed according to the departmental dose-optimization program which includes automated exposure control, adjustment of the mA and/or kV according to patient size and/or use of iterative reconstruction technique. CONTRAST:  60mL OMNIPAQUE IOHEXOL 350 MG/ML SOLN COMPARISON:  Head CT 0051 hours today. Brain MRI 12/21/2016. CTA chest 0053 hours today. FINDINGS: CTA NECK Skeleton: No acute osseous abnormality identified. Upper chest: Stable to the earlier CTA. Other neck: Negative neck soft tissues. Postoperative changes to the right globe. Aortic arch: Mildly bovine arch configuration with no arch atherosclerosis. Right carotid system: Negative aside from mild tortuosity. Left carotid system: Bovine left CCA origin with no plaque or stenosis. No atherosclerosis or stenosis in the neck. Vertebral arteries: Normal proximal right subclavian artery and right vertebral artery origin. Right vertebral artery appears mildly dominant, patent and normal to the skull base. Proximal left subclavian artery and left  vertebral artery origin are normal. Fairly normal size left vertebral artery is patent and normal to the skull base. CTA HEAD Posterior circulation: Normal distal vertebral arteries, PICA origins, vertebrobasilar junction. Mildly dominant right V4. Patent basilar artery without stenosis. Normal SCA and PCA origins. Posterior communicating arteries are diminutive or absent. Bilateral PCA branches are within normal limits. Anterior circulation: Both ICA siphons are patent and appear normal. Patent carotid termini. Normal MCA and ACA origins. Azygous type ACA A2 anatomy. But otherwise the ACA branches are within normal limits. MCA M1 segments and MCA bi/trifurcations appear normal. Bilateral MCA branches are within normal limits. Venous sinuses: Normal. Anatomic variants: Bovine aortic arch configuration. Azygous ACA A2 anatomy. Mildly dominant right vertebral artery. Review of the MIP images confirms the above findings IMPRESSION: Negative CTA Head and Neck. Electronically Signed   By: Odessa Fleming M.D.   On: 07/12/2023 05:13   CT Angio Chest/Abd/Pel for Dissection W and/or Wo Contrast Result Date: 07/12/2023 CLINICAL DATA:  Acute aortic syndrome suspected. Chest pain, palpitations, and dizziness starting this morning. Hand pain and color changes. EXAM: CT ANGIOGRAPHY CHEST, ABDOMEN AND PELVIS TECHNIQUE: Non-contrast CT of the chest was initially obtained. Multidetector CT imaging through the chest, abdomen and pelvis was performed using the standard protocol during bolus administration of intravenous contrast. Multiplanar reconstructed images and MIPs were obtained and reviewed to evaluate the vascular anatomy. RADIATION DOSE REDUCTION: This exam was performed according to the departmental dose-optimization program which includes automated exposure control, adjustment of the mA and/or kV according to patient size and/or use of iterative reconstruction technique. CONTRAST:  75mL OMNIPAQUE IOHEXOL 350 MG/ML SOLN  COMPARISON:  CT chest 03/19/2014.  CT abdomen and pelvis 01/02/2022 FINDINGS: CTA CHEST FINDINGS Cardiovascular: Unenhanced images of the chest demonstrate no significant aortic calcification. No evidence of acute intramural hematoma. Images obtained during arterial phase after intravenous  contrast material administration demonstrate normal caliber thoracic aorta. No aortic dissection. Great vessel origins are patent. Cardiac enlargement. Small pericardial effusion. Central pulmonary arteries are well opacified without evidence of any significant acute pulmonary embolus. Great vessel origins are patent. Mediastinum/Nodes: Thyroid gland is unremarkable. Esophagus is decompressed. No significant mediastinal lymphadenopathy. Mild prominence of axillary lymph nodes bilaterally, largest measuring up to about 8 mm short axis dimension. Similar appearance to prior study, likely reactive. Lungs/Pleura: Motion artifact limits examination. Hazy infiltrates demonstrated in the lung bases possibly representing edema or pneumonia. No pleural effusions. No pneumothorax. Musculoskeletal: No chest wall abnormality. No acute or significant osseous findings. Review of the MIP images confirms the above findings. CTA ABDOMEN AND PELVIS FINDINGS VASCULAR Aorta: Normal caliber aorta without aneurysm, dissection, vasculitis or significant stenosis. Celiac: Patent without evidence of aneurysm, dissection, vasculitis or significant stenosis. SMA: Patent without evidence of aneurysm, dissection, vasculitis or significant stenosis. Renals: Both renal arteries are patent without evidence of aneurysm, dissection, vasculitis, fibromuscular dysplasia or significant stenosis. IMA: Patent without evidence of aneurysm, dissection, vasculitis or significant stenosis. Inflow: Patent without evidence of aneurysm, dissection, vasculitis or significant stenosis. Veins: No obvious venous abnormality within the limitations of this arterial phase study.  Review of the MIP images confirms the above findings. NON-VASCULAR Hepatobiliary: No focal liver abnormality is seen. No gallstones, gallbladder wall thickening, or biliary dilatation. Pancreas: Unremarkable. No pancreatic ductal dilatation or surrounding inflammatory changes. Spleen: Normal in size without focal abnormality. Adrenals/Urinary Tract: Adrenal glands are unremarkable. Kidneys are normal, without renal calculi, focal lesion, or hydronephrosis. Bladder is unremarkable. Stomach/Bowel: Stomach, small bowel, and colon are not abnormally distended. No wall thickening or inflammatory changes. Appendix is normal. Lymphatic: No significant lymphadenopathy. Reproductive: Uterus and ovaries are not enlarged. Tubal ligation clips. Other: No free air or free fluid in the abdomen. Abdominal wall musculature appears intact. Musculoskeletal: No acute or significant osseous findings. Review of the MIP images confirms the above findings. IMPRESSION: 1. No evidence of aneurysm or dissection involving the thoracic or abdominal aorta. 2. Cardiac enlargement. 3. Hazy infiltrates in the lung bases may represent pneumonia or edema. 4. No acute process suggested in the abdomen or pelvis. 5. Aortic atherosclerosis. Electronically Signed   By: Burman Nieves M.D.   On: 07/12/2023 01:28   CT Head Wo Contrast Result Date: 07/12/2023 CLINICAL DATA:  Acute neurologic deficit EXAM: CT HEAD WITHOUT CONTRAST TECHNIQUE: Contiguous axial images were obtained from the base of the skull through the vertex without intravenous contrast. RADIATION DOSE REDUCTION: This exam was performed according to the departmental dose-optimization program which includes automated exposure control, adjustment of the mA and/or kV according to patient size and/or use of iterative reconstruction technique. 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 No mass,  hemorrhage or extra-axial collection. Vascular: No hyperdense vessel or unexpected calcification. Skull: Normal Sinuses/Orbits: Paranasal sinuses are clear. No mastoid effusion. Right ocular lens replacement and scleral band. Other: None IMPRESSION: 1. No acute intracranial abnormality. 2. Unchanged configuration of the lateral ventricles with posterior hemispheric white matter loss. Electronically Signed   By: Deatra Robinson M.D.   On: 07/12/2023 01:08   DG Chest 2 View Result Date: 07/11/2023 CLINICAL DATA:  Chest pain EXAM: CHEST - 2 VIEW COMPARISON:  Chest radiograph 10/01/2021 FINDINGS: The heart size and mediastinal contours are within normal limits. Both lungs are clear. The visualized skeletal structures are unremarkable. IMPRESSION: No active cardiopulmonary disease. Electronically Signed  By: Annia Belt M.D.   On: 07/11/2023 21:17    Microbiology: No results found for this or any previous visit (from the past 240 hours).   Labs: Basic Metabolic Panel: Recent Labs  Lab 07/12/23 0432 07/13/23 0311 07/14/23 0426 07/15/23 1047 07/16/23 0724 07/17/23 0759 07/18/23 0823  NA 130* 131* 132* 132* 129* 127* 130*  K 2.8* 4.8 4.2 5.0 4.7 4.8 4.6  CL 93* 96* 95* 96* 95* 94* 96*  CO2 25 24 24 25 23 25 23   GLUCOSE 110* 121* 102* 105* 85 92 96  BUN 10 20 24* 38* 42* 32* 30*  CREATININE 0.70 1.34* 1.00 1.79* 1.31* 1.38* 1.17*  CALCIUM 9.0 9.1 9.5 9.5 9.2 9.5 9.6  MG 1.6* 1.8 2.1  --   --  2.4 2.5*  PHOS 4.0  --   --   --   --   --  4.2   Liver Function Tests: Recent Labs  Lab 07/11/23 2314 07/12/23 0432 07/13/23 0311 07/14/23 0426 07/17/23 0759  AST 202* 193* 130* 113* 109*  ALT 108* 109* 84* 80* 64*  ALKPHOS 75 75 65 68 71  BILITOT 0.7 0.8 0.8 1.0 0.6  PROT 9.7* 10.2* 9.3* 10.0* 9.3*  ALBUMIN 3.0* 3.1* 2.9* 3.1* 2.9*   Recent Labs  Lab 07/11/23 2314  LIPASE 25   No results for input(s): "AMMONIA" in the last 168 hours. CBC: Recent Labs  Lab 07/12/23 0432  07/13/23 0311 07/14/23 0426 07/17/23 0759 07/18/23 0823  WBC 5.2 5.7 5.7 3.8* 3.4*  HGB 9.1* 8.9* 9.5* 10.2* 10.0*  HCT 30.9* 30.2* 32.9* 34.6* 34.4*  MCV 73.7* 73.7* 75.1* 74.6* 74.9*  PLT 335 343 370 365 415*   Cardiac Enzymes: No results for input(s): "CKTOTAL", "CKMB", "CKMBINDEX", "TROPONINI" in the last 168 hours. BNP: BNP (last 3 results) Recent Labs    07/12/23 0432  BNP 1,017.3*    ProBNP (last 3 results) No results for input(s): "PROBNP" in the last 8760 hours.  CBG: Recent Labs  Lab 07/16/23 1602 07/16/23 2117 07/17/23 0726 07/17/23 1831 07/18/23 0820  GLUCAP 87 112* 91 88 111*       Signed:  Lacretia Nicks MD.  [facility] 07/18/2023, 2:50 PM

## 2023-07-18 NOTE — TOC Transition Note (Incomplete)
Transition of Care [facility]) - Discharge Note   Patient Details  Name: Dana Parks MRN: [id number] Date of Birth: 08/05/1986  [facility] CM/SW Contact:  Epifanio Lesches, RN Phone Number: 07/18/2023, 3:05 PM   Clinical Narrative:    Patient will DC to: home Anticipated DC date: 07/18/2023 Family notified: step-mom Transport by: car   Per MD patient ready for DC today . RN, patient, patient's step-mom notified of DC.    RNCM will sign off for now as intervention is no longer needed. Please consult [place] again if new needs arise.    Final next level of care: Home/Self Care Barriers to Discharge: No Barriers Identified   Patient Goals and CMS Choice Patient states their goals for this hospitalization and ongoing recovery are:: Return to baseline functioning   Choice offered to / list presented to : Patient      Discharge Placement                       Discharge Plan and Services Additional resources added to the After Visit Summary for   In-house Referral: Clinical Social Work   Post Acute Care Choice: Skilled Nursing Facility          DME Arranged: Dan Humphreys rolling with seat DME Agency: [employer]       HH Arranged: Refused HH          Social Drivers of Health (SDOH) Interventions SDOH Screenings   Food Insecurity: No Food Insecurity (07/12/2023)  Housing: High Risk (07/18/2023)  Transportation Needs: No Transportation Needs (07/12/2023)  Utilities: Not At Risk (07/12/2023)  Tobacco Use: Medium Risk (07/11/2023)  Health Literacy: Inadequate Health Literacy (07/18/2023)     Readmission Risk Interventions     No data to display

## 2023-07-18 NOTE — Progress Notes (Signed)
PHARMACIST - PHYSICIAN COMMUNICATION   CONCERNING: IV to Oral Route Change Policy  RECOMMENDATION: This patient is receiving protonix by the intravenous route.  Based on criteria approved by the Pharmacy and Therapeutics Committee, the intravenous medication(s) is/are being converted to the equivalent oral dose form(s).   DESCRIPTION: These criteria include: The patient is eating (either orally or via tube) and/or has been taking other orally administered medications for a least 24 hours The patient has no evidence of active gastrointestinal bleeding or impaired GI absorption (gastrectomy, short bowel, patient on TNA or NPO).  If you have questions about this conversion, please contact the Pharmacy Department  []   [phone] )  [facility] []   [phone] )  [facility] [x]   [phone] )  [facility] []   [phone] )  [facility] []   [phone] )  Ilene Qua   Harland German, PharmD Clinical Pharmacist **Pharmacist phone directory can now be found on [email or web address] ([email or web address]).  Listed under [facility].   Harland German, PharmD Clinical Pharmacist **Pharmacist phone directory can now be found on [email or web address] (PW [email or web address]).  Listed under [facility].

## 2023-07-26 ENCOUNTER — Ambulatory Visit (INDEPENDENT_AMBULATORY_CARE_PROVIDER_SITE_OTHER): Payer: 59 | Admitting: Adult Health

## 2023-07-26 ENCOUNTER — Encounter: Payer: Self-pay | Admitting: Adult Health

## 2023-07-26 VITALS — BP 127/88 | HR 99 | Temp 98.4°F | Resp 18 | Ht 59.0 in | Wt 128.4 lb

## 2023-07-26 DIAGNOSIS — Z13220 Encounter for screening for lipoid disorders: Secondary | ICD-10-CM

## 2023-07-26 DIAGNOSIS — D509 Iron deficiency anemia, unspecified: Secondary | ICD-10-CM

## 2023-07-26 DIAGNOSIS — G40909 Epilepsy, unspecified, not intractable, without status epilepticus: Secondary | ICD-10-CM

## 2023-07-26 DIAGNOSIS — Z7689 Persons encountering health services in other specified circumstances: Secondary | ICD-10-CM

## 2023-07-26 DIAGNOSIS — K0510 Chronic gingivitis, plaque induced: Secondary | ICD-10-CM

## 2023-07-26 DIAGNOSIS — G809 Cerebral palsy, unspecified: Secondary | ICD-10-CM

## 2023-07-26 DIAGNOSIS — I10 Essential (primary) hypertension: Secondary | ICD-10-CM | POA: Diagnosis not present

## 2023-07-26 DIAGNOSIS — E538 Deficiency of other specified B group vitamins: Secondary | ICD-10-CM

## 2023-07-26 DIAGNOSIS — J45909 Unspecified asthma, uncomplicated: Secondary | ICD-10-CM

## 2023-07-26 MED ORDER — ALBUTEROL SULFATE HFA 108 (90 BASE) MCG/ACT IN AERS: 2.0000 | INHALATION_SPRAY | Freq: Four times a day (QID) | RESPIRATORY_TRACT | 5 refills | Status: DC | PRN

## 2023-07-26 NOTE — Progress Notes (Addendum)
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 abnormality within the limitations of this arterial phase study. Review of the MIP images confirms the above findings. NON-VASCULAR Hepatobiliary: No focal liver abnormality is seen. No gallstones, gallbladder wall thickening, or biliary dilatation. Pancreas: Unremarkable. No pancreatic ductal dilatation or surrounding inflammatory changes. Spleen: Normal in size without focal abnormality. Adrenals/Urinary Tract: Adrenal glands are unremarkable. Kidneys are normal, without renal calculi, focal lesion, or hydronephrosis. Bladder is unremarkable. Stomach/Bowel: Stomach, small bowel, and colon are not abnormally distended. No wall thickening or inflammatory changes. Appendix is normal. Lymphatic: No significant lymphadenopathy. Reproductive: Uterus and ovaries are not enlarged. Tubal ligation clips. Other: No free air or free fluid in the abdomen. Abdominal wall musculature appears intact. Musculoskeletal: No acute or significant osseous findings. Review of the MIP images confirms the above findings. IMPRESSION: 1. No evidence of aneurysm or dissection involving the thoracic or abdominal aorta. 2. Cardiac  enlargement. 3. Hazy infiltrates in the lung bases may represent pneumonia or edema. 4. No acute process suggested in the abdomen or pelvis. 5. Aortic atherosclerosis. Electronically Signed   By: Burman Nieves M.D.   On: 07/12/2023 01:28   CT Head Wo Contrast Result Date: 07/12/2023 CLINICAL DATA:  Acute neurologic deficit EXAM: CT HEAD WITHOUT CONTRAST TECHNIQUE: Contiguous axial images were obtained from the base of the skull through the vertex without intravenous contrast. RADIATION DOSE REDUCTION: This exam was performed according to the departmental dose-optimization program which includes automated exposure control, adjustment of the mA and/or kV according to patient size and/or use of iterative reconstruction technique. 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 No mass, hemorrhage or extra-axial collection. Vascular: No hyperdense vessel or unexpected calcification. Skull: Normal Sinuses/Orbits: Paranasal sinuses are clear. No mastoid effusion. Right ocular lens replacement and scleral band. Other: None IMPRESSION: 1. No acute intracranial abnormality. 2. Unchanged configuration of the lateral ventricles with posterior hemispheric white matter loss. Electronically Signed   By: Deatra Robinson M.D.   On: 07/12/2023 01:08   DG Chest 2 View Result Date: 07/11/2023 CLINICAL DATA:  Chest pain EXAM: CHEST - 2 VIEW COMPARISON:  Chest radiograph 10/01/2021 FINDINGS:  The heart size and mediastinal contours are within normal limits. Both lungs are clear. The visualized skeletal structures are unremarkable. IMPRESSION: No active cardiopulmonary disease. Electronically Signed   By: Annia Belt M.D.   On: 07/11/2023 21:17    Assessment/Plan  1. Encounter to establish care (Primary) -  established care with [facility]  2. Primary hypertension -  BP 127/88, stable -  takes Amlodipine only -  does not take Losartan and  Carvedilol since it makes her dizzy -  instructed to check BP and HR daily, log and bring to next appointment - Complete Metabolic Panel with eGFR  3. Seizure disorder (HCC) -   continue Keppra - Ambulatory referral to Neurology -  seizure precautions  4. Gingivitis -  has halitosis and gum bleeding daily -  step mom will set up appointment with dentist  5. Iron deficiency anemia, unspecified iron deficiency anemia type Lab Results  Component Value Date   HGB 10.0 (L) 07/18/2023    -  continue FeSO4 325 mg Q other day  6. Asthma, asthma unspecified severity, unspecified whether complicated, unspecified whether persistent -  no wheezing/SOB - albuterol (VENTOLIN HFA) 108 (90 Base) MCG/ACT inhaler; Inhale 2 puffs into the lungs every 6 (six) hours as needed for wheezing (wheezing).  Dispense: 1 each; Refill: 5  7. Screening for hyperlipidemia - Lipid panel  8. Vitamin B 12 deficiency -  continue vitamin B12 supplementation - Vitamin B12     Labs/tests ordered:   Vitamin B12, lipid panel and CMP   Next appt:  Visit date not found

## 2023-07-26 NOTE — Patient Instructions (Signed)

## 2023-07-27 LAB — LIPID PANEL
Cholesterol: 210 mg/dL — ABNORMAL HIGH (ref <200)
HDL: 28 mg/dL — ABNORMAL LOW (ref >=50)
LDL Cholesterol (Calc): 145 mg/dL — ABNORMAL HIGH
Non-HDL Cholesterol (Calc): 182 mg/dL — ABNORMAL HIGH (ref <130)
Total CHOL/HDL Ratio: 7.5 (calc) — ABNORMAL HIGH (ref <5.0)
Triglycerides: 231 mg/dL — ABNORMAL HIGH (ref <150)

## 2023-07-27 LAB — COMPLETE METABOLIC PANEL WITH GFR
AG Ratio: 0.5 (calc) — ABNORMAL LOW (ref 1.0–2.5)
ALT: 98 U/L — ABNORMAL HIGH (ref 6–29)
AST: 199 U/L — ABNORMAL HIGH (ref 10–30)
Albumin: 3.4 g/dL — ABNORMAL LOW (ref 3.6–5.1)
Alkaline phosphatase (APISO): 72 U/L (ref 31–125)
BUN: 12 mg/dL (ref 7–25)
CO2: 24 mmol/L (ref 20–32)
Calcium: 9.2 mg/dL (ref 8.6–10.2)
Chloride: 102 mmol/L (ref 98–110)
Creat: 0.61 mg/dL (ref 0.50–0.97)
Globulin: 6.3 g/dL — ABNORMAL HIGH (ref 1.9–3.7)
Glucose, Bld: 79 mg/dL (ref 65–99)
Potassium: 3.9 mmol/L (ref 3.5–5.3)
Sodium: 136 mmol/L (ref 135–146)
Total Bilirubin: 0.5 mg/dL (ref 0.2–1.2)
Total Protein: 9.7 g/dL — ABNORMAL HIGH (ref 6.1–8.1)
eGFR: 117 mL/min/{1.73_m2} (ref >=60)

## 2023-07-27 LAB — VITAMIN B12: Vitamin B-12: 875 pg/mL (ref 200–1100)

## 2023-07-28 IMAGING — CT CT ABD-PELV W/ CM
2 of 4 series · 16 of 46 positions shown, 18 images · IV contrast (agent unspecified)
Comparison: None Available.

CLINICAL DATA: Acute abdominal pain.

EXAM:
CT ABDOMEN AND PELVIS WITH CONTRAST
TECHNIQUE: Multidetector CT imaging of the abdomen and pelvis was performed
using the standard protocol following bolus administration of
intravenous contrast.

[Series 2: axial st · axial · 0.64mm/px · z∈[+666,+1020]mm · 13 of 81 slices shown, 15 images]
[im 5/81  soft-tissue]
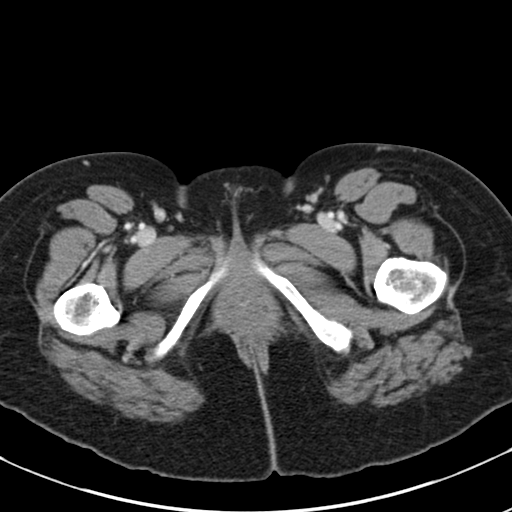
[im 5/81  bone]
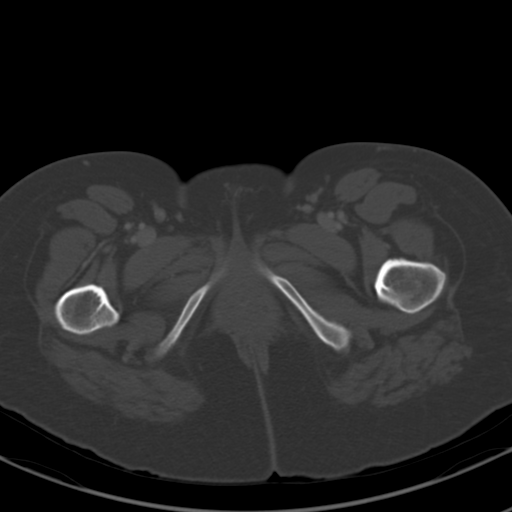
[im 10/81  soft-tissue]
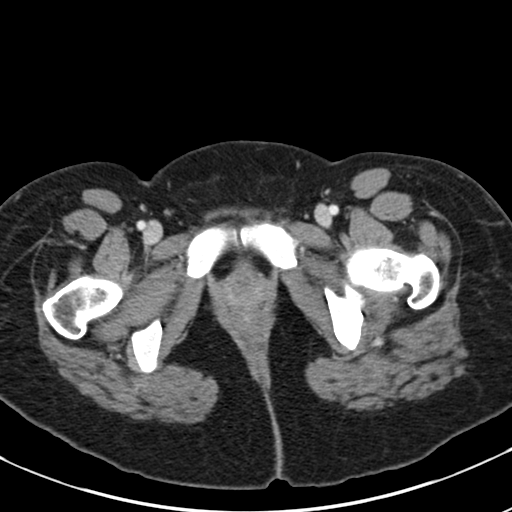
[im 19/81  soft-tissue]
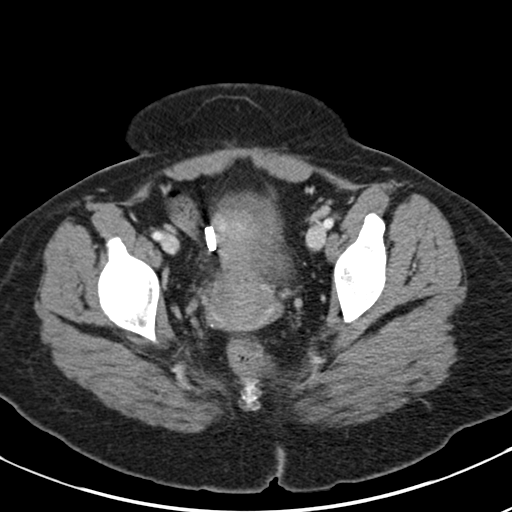
[im 24/81  soft-tissue]
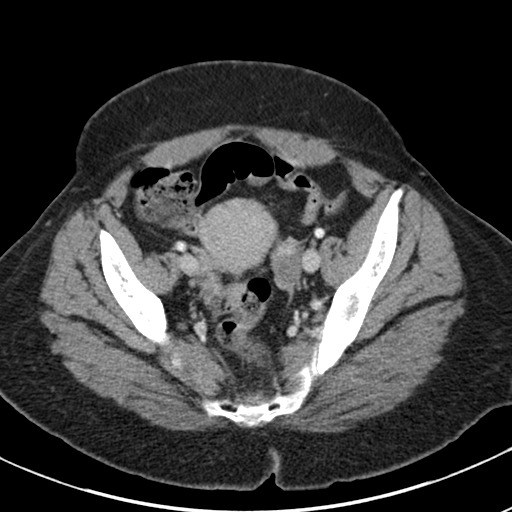
[im 29/81  soft-tissue]
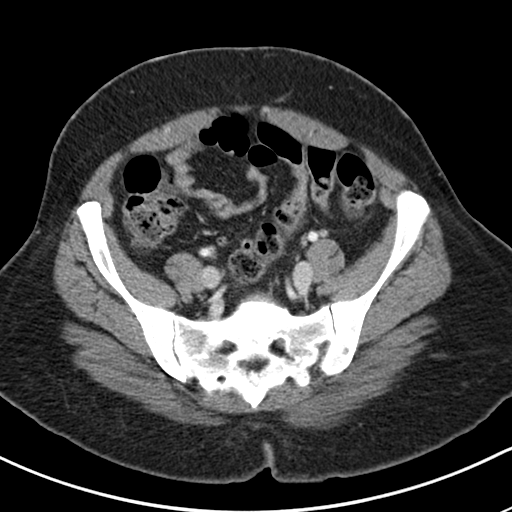
[im 33/81  soft-tissue]
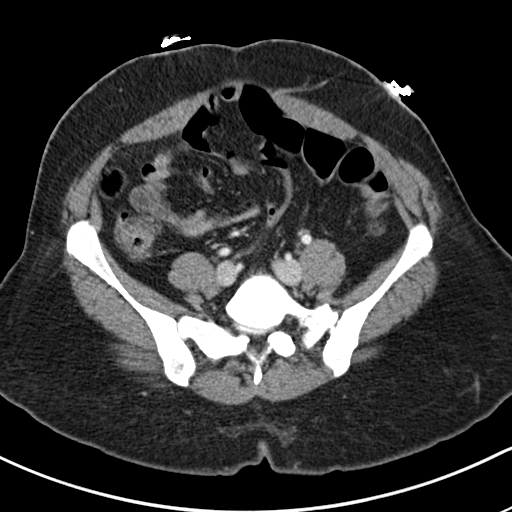
[im 43/81  soft-tissue]
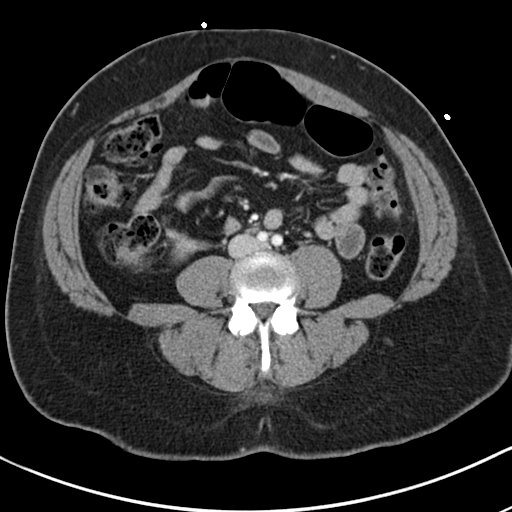
[im 48/81  soft-tissue]
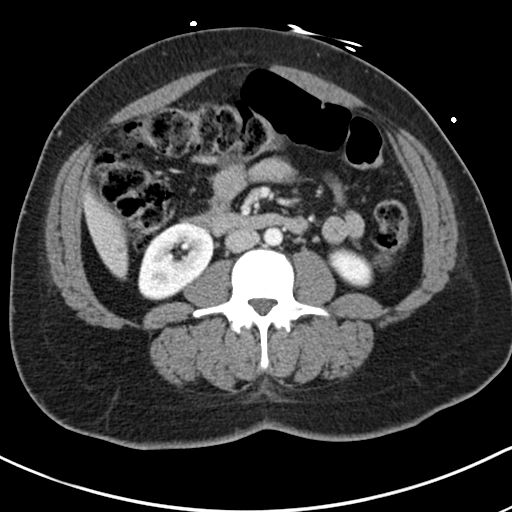
[im 52/81  soft-tissue]
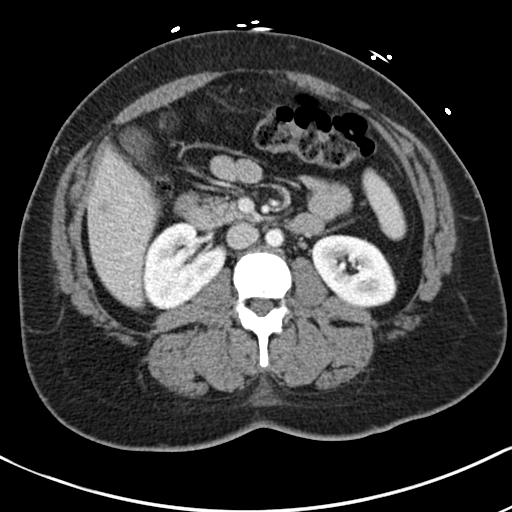
[im 52/81  bone]
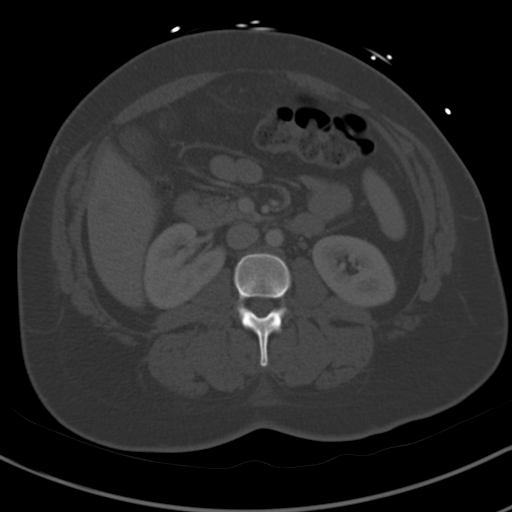
[im 57/81  soft-tissue]
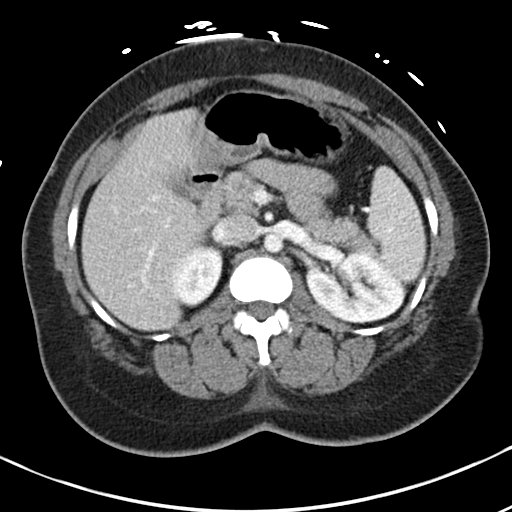
[im 62/81  soft-tissue]
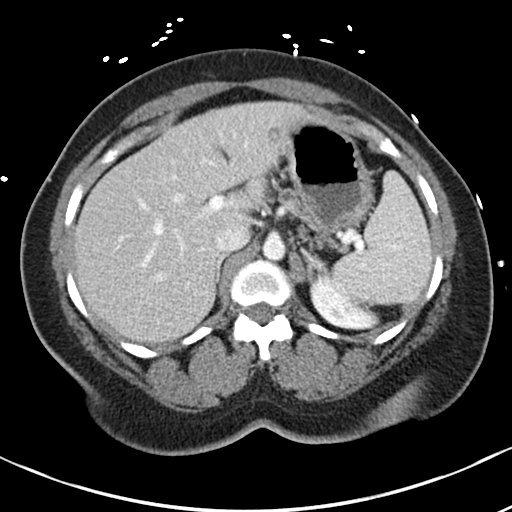
[im 71/81  soft-tissue]
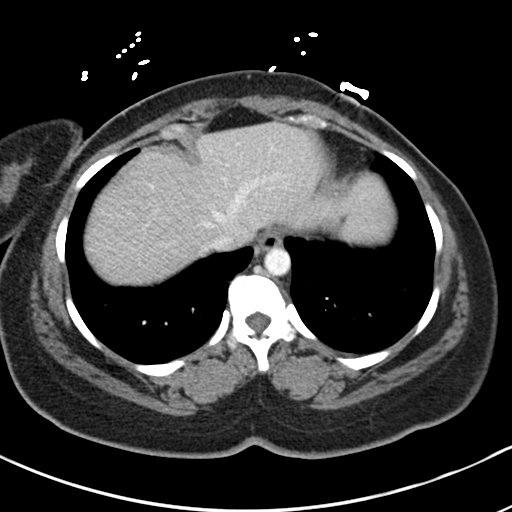
[im 76/81  soft-tissue]
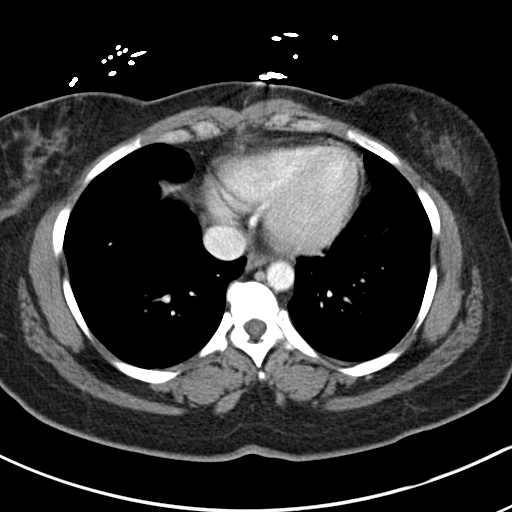

[Series 5: coronal st · coronal · 0.71mm/px · 3 of 151 slices shown]
[im 51/151  soft-tissue]
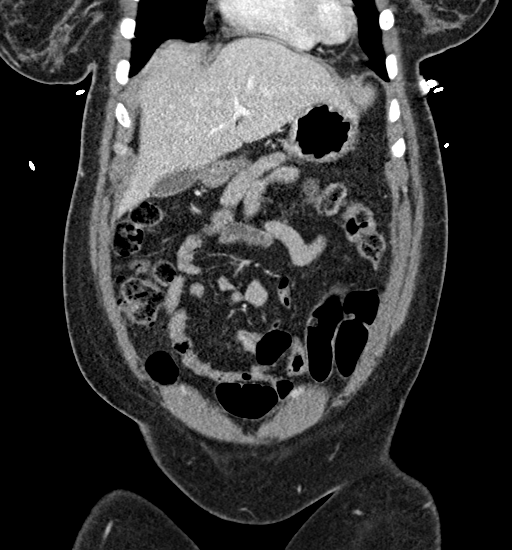
[im 67/151  soft-tissue]
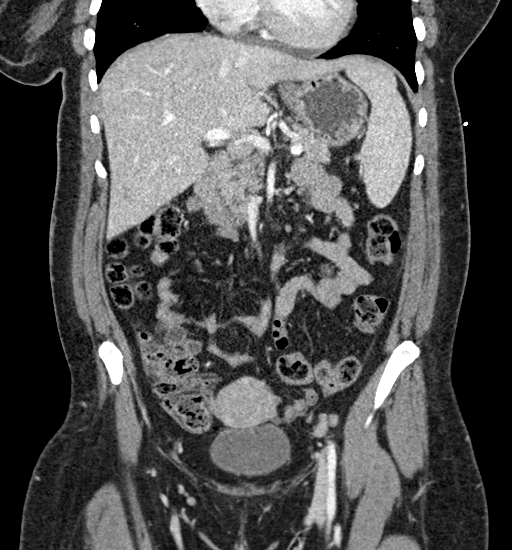
[im 84/151  soft-tissue]
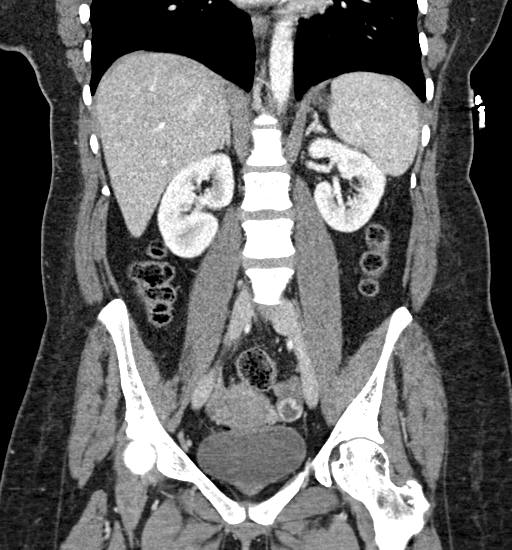

[16 of 46 positions shown; findings below may reference images not displayed]

RADIATION DOSE REDUCTION: This exam was performed according to the
departmental dose-optimization program which includes automated
exposure control, adjustment of the mA and/or kV according to
patient size and/or use of iterative reconstruction technique.

CONTRAST:  100mL OMNIPAQUE IOHEXOL 300 MG/ML  SOLN
FINDINGS: Lower chest: No acute abnormality.

Hepatobiliary: There are 3 subcentimeter areas of hypodensity within
the liver which are indeterminate. These do not appear to be simple
cysts. The gallbladder and bile ducts are within normal limits.

Pancreas: Unremarkable. No pancreatic ductal dilatation or
surrounding inflammatory changes.

Spleen: Normal in size without focal abnormality.

Adrenals/Urinary Tract: Adrenal glands are unremarkable. Kidneys are
normal, without renal calculi, focal lesion, or hydronephrosis.
Bladder is unremarkable.

Stomach/Bowel: Stomach is within normal limits. Appendix appears
normal. No evidence of bowel wall thickening, distention, or
inflammatory changes.

Vascular/Lymphatic: No significant vascular findings are present. No
enlarged abdominal or pelvic lymph nodes.

Reproductive: There is likely a collapsing follicle or cyst in the
left ovary measuring 18 mm. Right ovary and uterus are within normal
limits.

Other: Trace free fluid in the pelvis and left adnexa. There is
bulging of the anterior abdominal wall. There is a surgical clip in
the right pelvis.

Musculoskeletal: No acute or significant osseous findings.
IMPRESSION: 1. Small collapsing cyst or follicle in the left ovary. Small amount
of free fluid in the pelvis and left adnexa.
2. Hypodensities in the liver are indeterminate. These can be
further characterized with MRI.

## 2023-08-02 NOTE — Progress Notes (Signed)
-     All lipids elevated, we will need to increase vegetable intake, decrease fatty food and red meat intake -    Liver enzymes elevated, do still drink alcohol -     Vitamin B12 normal

## 2023-08-09 ENCOUNTER — Ambulatory Visit: Payer: 59 | Admitting: Adult Health

## 2023-08-09 ENCOUNTER — Ambulatory Visit (HOSPITAL_BASED_OUTPATIENT_CLINIC_OR_DEPARTMENT_OTHER): Payer: 59 | Admitting: Family

## 2023-08-19 ENCOUNTER — Telehealth: Payer: Self-pay | Admitting: *Deleted

## 2023-08-19 NOTE — Telephone Encounter (Signed)
 Patient step mom, Zelda called and stated that patient was put on Cyanocobalamin  in the Hospital. Patient has not been taking because it was causing dizziness. Wanted to know if she should be taking anyway.   Patient has an appointment on Friday 08/23/2023 and will bring ALL medication bottles to appointment to update current medication list.

## 2023-08-20 NOTE — Telephone Encounter (Signed)
 Medina-Vargas, Monina C, NP  You4 hours ago (9:07 AM)   Her Vitamin B12 level is within normal, taken 07/26/23.  Vitamin B12 is present in eggs, fish, milk, liver, salmon, liver, red meat and poultry. Her Vitamin B12 level was low previously and we can re-check level again in the future if she does not want to take it.  Monina     Tried calling, LMOM to return call.

## 2023-08-21 NOTE — Telephone Encounter (Signed)
Patient caregiver notified and agreed. 

## 2023-08-23 ENCOUNTER — Ambulatory Visit (INDEPENDENT_AMBULATORY_CARE_PROVIDER_SITE_OTHER): Payer: 59 | Admitting: Adult Health

## 2023-08-23 VITALS — BP 135/78 | HR 101 | Temp 98.0°F | Resp 18 | Ht 59.0 in | Wt 125.4 lb

## 2023-08-23 DIAGNOSIS — J45909 Unspecified asthma, uncomplicated: Secondary | ICD-10-CM

## 2023-08-23 DIAGNOSIS — I10 Essential (primary) hypertension: Secondary | ICD-10-CM | POA: Diagnosis not present

## 2023-08-23 DIAGNOSIS — K0510 Chronic gingivitis, plaque induced: Secondary | ICD-10-CM | POA: Diagnosis not present

## 2023-08-23 DIAGNOSIS — E782 Mixed hyperlipidemia: Secondary | ICD-10-CM

## 2023-08-23 DIAGNOSIS — D509 Iron deficiency anemia, unspecified: Secondary | ICD-10-CM | POA: Diagnosis not present

## 2023-08-23 DIAGNOSIS — R748 Abnormal levels of other serum enzymes: Secondary | ICD-10-CM

## 2023-08-23 DIAGNOSIS — G40909 Epilepsy, unspecified, not intractable, without status epilepticus: Secondary | ICD-10-CM | POA: Diagnosis not present

## 2023-08-23 MED ORDER — IBUPROFEN 800 MG PO TABS: 800.0000 mg | ORAL_TABLET | Freq: Every day | ORAL | Status: DC

## 2023-08-23 MED ORDER — FLUTICASONE PROPIONATE 50 MCG/ACT NA SUSP: 1.0000 | Freq: Two times a day (BID) | NASAL | Status: DC | PRN

## 2023-08-23 MED ORDER — CARVEDILOL 25 MG PO TABS: 25.0000 mg | ORAL_TABLET | Freq: Two times a day (BID) | ORAL | Status: DC

## 2023-08-23 MED ORDER — ROSUVASTATIN CALCIUM 5 MG PO TABS
5.0000 mg | ORAL_TABLET | Freq: Every day | ORAL | 3 refills | Status: DC
Start: 2023-08-23 — End: 2023-08-23

## 2023-08-23 MED ORDER — AMLODIPINE BESYLATE 5 MG PO TABS: 5.0000 mg | ORAL_TABLET | Freq: Every day | ORAL | Status: DC

## 2023-08-23 NOTE — Progress Notes (Signed)
[facility] clinic  Provider:  Kenard Gower DNP  Code Status:  Full Code  Goals of Care:     08/23/2023    3:25 PM  Advanced Directives  Does Patient Have a Medical Advance Directive? Yes  Type of Advance Directive Healthcare Power of Attorney  Does patient want to make changes to medical advance directive? No - Patient declined  Copy of Healthcare Power of Attorney in Chart? Yes - validated most recent copy scanned in chart (See row information)  Would patient like information on creating a medical advance directive? No - Patient declined     Chief Complaint  Patient presents with   Medical Management of Chronic Issues     one week follow-up. Patient did bring medications    Immunizations    Pneumonia, Influenza, and Covid. Patient wanted to wait to get her vaccine.    Health Maintenance    Medicare Annual Wellness Visit    HPI: Patient is a 38 y.o. female seen today for follow up of chronic medical issues. She was accompanied today by her step mom and daughter.  Primary hypertension -   -  BP 135/78, takes Spironolactone and Amlodipine  Seizure disorder (HCC) - last seizure was 15 years ago, does not take Keppra, stopped taking Keppra June 2024  Gingivitis - gums bleeding, dental appointment next month  Iron deficiency anemia, unspecified iron deficiency anemia type-  hgb 10, takes FeSO4  Asthma, unspecified asthma severity, unspecified whether complicated, unspecified whether persistent  -  no wheezing, takes Albuterol PRN  Past Medical History:  Diagnosis Date   Allergic rhinitis    Anemia    of other chronic disease   Anxiety    Asthma    Back pain    unknown    Cerebral palsy (HCC)    Headache(784.0)    frequently   Hypertension    Incontinence of urine    Liver disease    Denies 09/10/2018   Low sodium diet    Lumbago 05/21/2014   pain in lower back    Muscle spasm    takes Flexeril daily   Neuromuscular disorder (HCC)    cerebral palsy    Numbness    bilateral feet   Peripheral edema    occasionally   PONV (postoperative nausea and vomiting)    Rhegmatogenous retinal detachment of both eyes    Seizure disorder (HCC)    Seizures (HCC)    last one in high school;takes Depakote daily   Wears glasses     Past Surgical History:  Procedure Laterality Date   CESAREAN SECTION  2007   CESAREAN SECTION WITH BILATERAL TUBAL LIGATION Bilateral 03/03/2015   Procedure: CESAREAN SECTION WITH BILATERAL TUBAL LIGATION;  Surgeon: Willodean Rosenthal, MD;  Location: [facility] OR[facility];  Service: Obstetrics;  Laterality: Bilateral;  Requested 03/03/15 @ 3:30p  Ok per Cleo/Myra-TM   GAS/FLUID EXCHANGE Right 09/11/2018   Procedure: Gas/Fluid Exchange RIGHT EYE;  Surgeon: Rennis Chris, MD;  Location: [facility] OR;  Service: Ophthalmology;  Laterality: Right;  [id number]   LASER PHOTO ABLATION Left 09/11/2018   Procedure: LASER PHOTO ABLATION LEFT EYE;  Surgeon: Rennis Chris, MD;  Location: [facility] OR;  Service: Ophthalmology;  Laterality: Left;   LEG SURGERY Bilateral    PARS PLANA VITRECTOMY Right 09/11/2018   Procedure: PARS PLANA VITRECTOMY WITH 25 GAUGE RIGHT EYE;  Surgeon: Rennis Chris, MD;  Location: [facility] OR;  Service: Ophthalmology;  Laterality: Right;   PERFLUORONE INJECTION Right 09/11/2018   Procedure: Perfluorone  Injection RIGHT EYE;  Surgeon: Rennis Chris, MD;  Location: [facility] OR;  Service: Ophthalmology;  Laterality: Right;   PHOTOCOAGULATION WITH LASER Right 09/11/2018   Procedure: Photocoagulation With Laser RIGHT EYE;  Surgeon: Rennis Chris, MD;  Location: [facility] OR;  Service: Ophthalmology;  Laterality: Right;   SCLERAL BUCKLE Right 09/11/2018   Procedure: Scleral Buckle RIGHT EYE;  Surgeon: Rennis Chris, MD;  Location: [facility] OR;  Service: Ophthalmology;  Laterality: Right;   TOOTH EXTRACTION N/A 02/16/2013   Procedure: EXTRACTION 16, 17, 32;  Surgeon: Georgia Lopes, DDS;  Location: [facility] OR;  Service: Oral Surgery;  Laterality: N/A;    Allergies  Allergen Reactions    Shrimp [Shellfish Allergy] Anaphylaxis    Can eat other shellfish   Codeine Other (See Comments)    GI upset   Dust Mite Extract Other (See Comments)    Unknown reaction   Mold Extract [Trichophyton] Other (See Comments)    Unknown reaction   Penicillins Hives and Other (See Comments)    Has patient had a PCN reaction causing immediate rash, facial/tongue/throat swelling, SOB or lightheadedness with hypotension: No Has patient had a PCN reaction causing severe rash involving mucus membranes or skin necrosis: No Has patient had a PCN reaction that required hospitalization: Yes Has patient had a PCN reaction occurring within the last 10 years: No If all of the above answers are "NO", then may proceed with Cephalosporin use.  Unknown reaction; pt has gotten cefazolin   Soap Other (See Comments)    Unknown reaction    Outpatient Encounter Medications as of 08/23/2023  Medication Sig   albuterol (VENTOLIN HFA) 108 (90 Base) MCG/ACT inhaler Inhale 2 puffs into the lungs every 6 (six) hours as needed for wheezing (wheezing).   amLODipine (NORVASC) 5 MG tablet Take 1 tablet (5 mg total) by mouth at bedtime.   ferrous sulfate 325 (65 FE) MG tablet Take 1 tablet (325 mg total) by mouth every other day.   ibuprofen (ADVIL,MOTRIN) 800 MG tablet Take 1 tablet (800 mg total) by mouth every 8 (eight) hours as needed for moderate pain. (Patient taking differently: Take 800 mg by mouth 2 (two) times daily.)   spironolactone (ALDACTONE) 25 MG tablet Take 0.5 tablets (12.5 mg total) by mouth daily.   budesonide-formoterol (SYMBICORT) 160-4.5 MCG/ACT inhaler Inhale 2 puffs into the lungs 2 (two) times daily. Rinse mouth with water after each use (Patient not taking: Reported on 07/26/2023)   carvedilol (COREG) 25 MG tablet Take 1 tablet (25 mg total) by mouth 2 (two) times daily with a meal. (Patient not taking: Reported on 08/23/2023)   fluticasone (FLONASE) 50 MCG/ACT nasal spray Place 1 spray into both  nostrils in the morning and at bedtime. (Patient not taking: Reported on 07/26/2023)   levETIRAcetam (KEPPRA) 750 MG tablet Take 1 tablet (750 mg total) by mouth 2 (two) times daily. Follow up with your outpatient neurologist, we adjusted this dose based on your kidney function (Patient not taking: Reported on 08/23/2023)   losartan (COZAAR) 50 MG tablet Take 1 tablet (50 mg total) by mouth daily. (Patient not taking: Reported on 08/23/2023)   No facility-administered encounter medications on file as of 08/23/2023.    Review of Systems:  Review of Systems  Constitutional:  Negative for appetite change, chills, fatigue and fever.  HENT:  Negative for congestion, hearing loss, rhinorrhea and sore throat.   Eyes: Negative.   Respiratory:  Negative for cough, shortness of breath and wheezing.   Cardiovascular:  Negative for chest pain, palpitations and leg swelling.  Gastrointestinal:  Negative for abdominal pain, constipation, diarrhea, nausea and vomiting.  Genitourinary:  Negative for dysuria.  Musculoskeletal:  Negative for arthralgias, back pain and myalgias.  Skin:  Negative for color change, rash and wound.  Neurological:  Negative for dizziness, weakness and headaches.  Psychiatric/Behavioral:  Negative for behavioral problems. The patient is not nervous/anxious.     Health Maintenance  Topic Date Due   Medicare Annual Wellness (AWV)  Never done   Pneumococcal Vaccine 72-36 Years old (2 of 2 - PCV) 03/04/2016   INFLUENZA VACCINE  Never done   COVID-19 Vaccine (1 - 2024-25 season) Never done   DTaP/Tdap/Td (2 - Td or Tdap) 12/12/2024   Cervical Cancer Screening (HPV/Pap Cotest)  03/09/2027   Hepatitis C Screening  Completed   HIV Screening  Completed   HPV VACCINES  Aged Out    Physical Exam: Vitals:   08/23/23 1530  BP: 135/78  Pulse: (!) 101  Resp: 18  Temp: 98 F (36.7 C)  SpO2: 99%  Weight: 125 lb 6.4 oz (56.9 kg)  Height: 4\' 11"  (1.499 m)   Body mass index is 25.33  kg/m. Physical Exam Constitutional:      Appearance: Normal appearance.  HENT:     Head: Normocephalic and atraumatic.     Nose: Nose normal.     Mouth/Throat:     Mouth: Mucous membranes are moist.  Eyes:     Conjunctiva/sclera: Conjunctivae normal.  Cardiovascular:     Rate and Rhythm: Normal rate and regular rhythm.  Pulmonary:     Effort: Pulmonary effort is normal.     Breath sounds: Normal breath sounds.  Abdominal:     General: Bowel sounds are normal.     Palpations: Abdomen is soft.  Musculoskeletal:        General: Normal range of motion.     Cervical back: Normal range of motion.  Skin:    General: Skin is warm and dry.  Neurological:     General: No focal deficit present.     Mental Status: She is alert and oriented to person, place, and time.  Psychiatric:        Mood and Affect: Mood normal.        Behavior: Behavior normal.        Thought Content: Thought content normal.        Judgment: Judgment normal.     Labs reviewed: Basic Metabolic Panel: Recent Labs    07/12/23 0432 07/13/23 0311 07/14/23 0426 07/15/23 1047 07/17/23 0759 07/18/23 0823 07/26/23 1151  NA 130*   < > 132*   < > 127* 130* 136  K 2.8*   < > 4.2   < > 4.8 4.6 3.9  CL 93*   < > 95*   < > 94* 96* 102  CO2 25   < > 24   < > 25 23 24   GLUCOSE 110*   < > 102*   < > 92 96 79  BUN 10   < > 24*   < > 32* 30* 12  CREATININE 0.70   < > 1.00   < > 1.38* 1.17* 0.61  CALCIUM 9.0   < > 9.5   < > 9.5 9.6 9.2  MG 1.6*   < > 2.1  --  2.4 2.5*  --   PHOS 4.0  --   --   --   --  4.2  --  TSH 5.420*  --   --   --   --   --   --    < > = values in this interval not displayed.   Liver Function Tests: Recent Labs    07/13/23 0311 07/14/23 0426 07/17/23 0759 07/26/23 1151  AST 130* 113* 109* 199*  ALT 84* 80* 64* 98*  ALKPHOS 65 68 71  --   BILITOT 0.8 1.0 0.6 0.5  PROT 9.3* 10.0* 9.3* 9.7*  ALBUMIN 2.9* 3.1* 2.9*  --    Recent Labs    07/11/23 2314  LIPASE 25   No results for  input(s): "AMMONIA" in the last 8760 hours. CBC: Recent Labs    07/14/23 0426 07/17/23 0759 07/18/23 0823  WBC 5.7 3.8* 3.4*  HGB 9.5* 10.2* 10.0*  HCT 32.9* 34.6* 34.4*  MCV 75.1* 74.6* 74.9*  PLT 370 365 415*   Lipid Panel: Recent Labs    07/12/23 0432 07/26/23 1151  CHOL 195 210*  HDL 24* 28*  LDLCALC 131* 454*  TRIG 202* 231*  CHOLHDL 8.1 7.5*   Lab Results  Component Value Date   HGBA1C 5.1 07/12/2023    Procedures since last visit: No results found.  Assessment/Plan  1. Primary hypertension (Primary) -  BP 135/78 -  will discontinue Amlodipine and start on Carvedilol -  check BP, log and bring to next appointment - carvedilol (COREG) 25 MG tablet; Take 1 tablet (25 mg total) by mouth 2 (two) times daily with a meal.  2. Seizure disorder (HCC) -  no recent seizure -  not taking Keppra - Ambulatory referral to Neurology  3. Gingivitis -  brush teeth BID -  follow up with dentist  4. Iron deficiency anemia, unspecified iron deficiency anemia type Lab Results  Component Value Date   HGB 10.0 (L) 07/18/2023    -  continue FeSO4  5. Asthma, unspecified asthma severity, unspecified whether complicated, unspecified whether persistent - no wheezing -  stable - fluticasone (FLONASE) 50 MCG/ACT nasal spray; Place 1 spray into both nostrils 2 (two) times daily as needed for allergies or rhinitis.  6. Mixed hyperlipidemia Lab Results  Component Value Date   CHOL 210 (H) 07/26/2023   HDL 28 (L) 07/26/2023   LDLCALC 145 (H) 07/26/2023   TRIG 231 (H) 07/26/2023   CHOLHDL 7.5 (H) 07/26/2023    -  liver enzymes elevated -  will hold off on starting statin -  counseled on diet and exercise  7. Elevated liver enzymes -  discontinue Ibuprofen -  refrain from taking Tylenol -   denies taking drugs -  will repeat liver enzymes in a month    Labs/tests ordered:  liver enzymes in a month  Next appt:  Visit date not found

## 2023-08-29 ENCOUNTER — Telehealth: Payer: Self-pay

## 2023-08-29 NOTE — Telephone Encounter (Signed)
Medina-Vargas, Monina C, NP ask me to get in contact with the patient because Medina-Vargas, Monina C, NP want to know where should she send a another referral for Neurology. Left message on voicemail for patient to return call when available .  Message sent to Medina-Vargas, [name], NP

## 2023-08-30 ENCOUNTER — Telehealth: Payer: Self-pay | Admitting: Adult Health

## 2023-08-30 NOTE — Telephone Encounter (Signed)
Patient stepmother / caregiver called in to inform Monina that she need to get the referral for Neurology to be sent to [facility]) located at [address] [place] [address]  Ph# [phone] and Fax# [phone]

## 2023-08-30 NOTE — Telephone Encounter (Addendum)
Spoke with patient mother and she call a place but did not know the office name and she will call back for the name and office.

## 2023-08-30 NOTE — Telephone Encounter (Signed)
Please forward referral as requested.

## 2023-09-05 ENCOUNTER — Other Ambulatory Visit: Payer: Self-pay | Admitting: Adult Health

## 2023-09-05 DIAGNOSIS — G40909 Epilepsy, unspecified, not intractable, without status epilepticus: Secondary | ICD-10-CM

## 2023-09-05 NOTE — Telephone Encounter (Signed)
[name] Response:   Records faxed from within referral. Called pt and left a message providing [name]'s phone#.    Message sent to Medina-Vargas, [name], NP

## 2023-09-26 ENCOUNTER — Ambulatory Visit: Payer: 59 | Admitting: Adult Health

## 2023-10-14 ENCOUNTER — Ambulatory Visit (INDEPENDENT_AMBULATORY_CARE_PROVIDER_SITE_OTHER): Payer: 59 | Admitting: Adult Health

## 2023-10-14 ENCOUNTER — Encounter: Payer: Self-pay | Admitting: Adult Health

## 2023-10-14 VITALS — BP 122/87 | HR 84 | Temp 97.9°F | Resp 18 | Ht 59.0 in | Wt 127.0 lb

## 2023-10-14 DIAGNOSIS — I10 Essential (primary) hypertension: Secondary | ICD-10-CM

## 2023-10-14 DIAGNOSIS — R748 Abnormal levels of other serum enzymes: Secondary | ICD-10-CM | POA: Diagnosis not present

## 2023-10-14 DIAGNOSIS — G40909 Epilepsy, unspecified, not intractable, without status epilepticus: Secondary | ICD-10-CM

## 2023-10-14 DIAGNOSIS — K5901 Slow transit constipation: Secondary | ICD-10-CM

## 2023-10-14 DIAGNOSIS — I5032 Chronic diastolic (congestive) heart failure: Secondary | ICD-10-CM | POA: Diagnosis not present

## 2023-10-14 DIAGNOSIS — E782 Mixed hyperlipidemia: Secondary | ICD-10-CM

## 2023-10-14 DIAGNOSIS — D509 Iron deficiency anemia, unspecified: Secondary | ICD-10-CM

## 2023-10-14 DIAGNOSIS — L219 Seborrheic dermatitis, unspecified: Secondary | ICD-10-CM

## 2023-10-14 DIAGNOSIS — K219 Gastro-esophageal reflux disease without esophagitis: Secondary | ICD-10-CM

## 2023-10-14 MED ORDER — SPIRONOLACTONE 25 MG PO TABS: 12.5000 mg | ORAL_TABLET | Freq: Every day | ORAL | 0 refills | Status: DC

## 2023-10-14 MED ORDER — FERROUS SULFATE 325 (65 FE) MG PO TABS: 325.0000 mg | ORAL_TABLET | ORAL | 0 refills | Status: DC

## 2023-10-14 MED ORDER — KETOCONAZOLE 2 % EX CREA: 1.0000 | TOPICAL_CREAM | Freq: Two times a day (BID) | CUTANEOUS | 3 refills | Status: DC

## 2023-10-14 NOTE — Progress Notes (Unsigned)
 [facility] clinic  Provider:  Kenard Gower DNP  Code Status:   Goals of Care:     08/23/2023    3:25 PM  Advanced Directives  Does Patient Have a Medical Advance Directive? Yes  Type of Advance Directive Healthcare Power of Attorney  Does patient want to make changes to medical advance directive? No - Patient declined  Copy of Healthcare Power of Attorney in Chart? Yes - validated most recent copy scanned in chart (See row information)  Would patient like information on creating a medical advance directive? No - Patient declined     Chief Complaint  Patient presents with   Medical Management of Chronic Issues    one month    Discussed the use of AI scribe software for clinical note transcription with the patient, who gave verbal consent to proceed.  HPI: Patient is a 38 y.o. female seen today for a 76-month follow up of chronic medical issues. She is accompanied today by her stepmother and daughter.   She has elevated liver enzymes, with a history of alcohol use, marijuana use,  Tylenol and ibuprofen, which could contribute to liver issues.  She experiences chest pain after eating, particularly with heavy foods, which starts in the stomach and radiates to the chest. Relief is found by consuming lighter foods such as soup, chicken, or rice. She has not been taking any medication for acid reflux recently.  She experiences constipation, with bowel movements occurring every two to three days.  She has a history of seizures, with the last one occurring in her late twenties, associated with alcohol consumption and stress. She is not currently taking any seizure medication and has not had a seizure since.  She has a history of alcohol and drug use but reports cessation. She mentions a recent incident involving edible marijuana.  She has a history of hypertension and is currently taking carvedilol 25 mg twice a day. Her blood pressure is well-controlled with this medication.  She  has a history of cerebral palsy, which has been present since childhood. She was active in the [employer] and has been able to manage her condition well.  She reports skin changes, including darkening and itching, particularly on her neck. She has been using Head and Shoulders shampoo for dandruff, which she applies twice a week.       Past Medical History:  Diagnosis Date   Allergic rhinitis    Anemia    of other chronic disease   Anxiety    Asthma    Back pain    unknown    Cerebral palsy (HCC)    Headache(784.0)    frequently   Hypertension    Incontinence of urine    Liver disease    Denies 09/10/2018   Low sodium diet    Lumbago 05/21/2014   pain in lower back    Muscle spasm    takes Flexeril daily   Neuromuscular disorder (HCC)    cerebral palsy   Numbness    bilateral feet   Peripheral edema    occasionally   PONV (postoperative nausea and vomiting)    Rhegmatogenous retinal detachment of both eyes    Seizure disorder (HCC)    Seizures (HCC)    last one in high school;takes Depakote daily   Wears glasses     Past Surgical History:  Procedure Laterality Date   CESAREAN SECTION  2007   CESAREAN SECTION WITH BILATERAL TUBAL LIGATION Bilateral 03/03/2015   Procedure: CESAREAN SECTION  WITH BILATERAL TUBAL LIGATION;  Surgeon: Willodean Rosenthal, MD;  Location: [facility];  Service: Obstetrics;  Laterality: Bilateral;  Requested 03/03/15 @ 3:30p  Ok per Cleo/Myra-TM   GAS/FLUID EXCHANGE Right 09/11/2018   Procedure: Gas/Fluid Exchange RIGHT EYE;  Surgeon: Rennis Chris, MD;  Location: [facility] OR;  Service: Ophthalmology;  Laterality: Right;  [id number]   LASER PHOTO ABLATION Left 09/11/2018   Procedure: LASER PHOTO ABLATION LEFT EYE;  Surgeon: Rennis Chris, MD;  Location: [facility] OR;  Service: Ophthalmology;  Laterality: Left;   LEG SURGERY Bilateral    PARS PLANA VITRECTOMY Right 09/11/2018   Procedure: PARS PLANA VITRECTOMY WITH 25 GAUGE RIGHT EYE;  Surgeon: Rennis Chris, MD;   Location: [facility] OR;  Service: Ophthalmology;  Laterality: Right;   PERFLUORONE INJECTION Right 09/11/2018   Procedure: Perfluorone Injection RIGHT EYE;  Surgeon: Rennis Chris, MD;  Location: [facility] OR;  Service: Ophthalmology;  Laterality: Right;   PHOTOCOAGULATION WITH LASER Right 09/11/2018   Procedure: Photocoagulation With Laser RIGHT EYE;  Surgeon: Rennis Chris, MD;  [facility] OR;  Service: Ophthalmology;  Laterality: Right;   SCLERAL BUCKLE Right 09/11/2018   Procedure: Scleral Buckle RIGHT EYE;  Surgeon: Rennis Chris, MD;  Location: [facility] OR;  Service: Ophthalmology;  Laterality: Right;   TOOTH EXTRACTION N/A 02/16/2013   Procedure: EXTRACTION 16, 17, 32;  Surgeon: Georgia Lopes, DDS;  Location: [facility] OR;  Service: Oral Surgery;  Laterality: N/A;    Allergies  Allergen Reactions   Shrimp [Shellfish Allergy] Anaphylaxis    Can eat other shellfish   Codeine Other (See Comments)    GI upset   Dust Mite Extract Other (See Comments)    Unknown reaction   Mold Extract [Trichophyton] Other (See Comments)    Unknown reaction   Penicillins Hives and Other (See Comments)    Has patient had a PCN reaction causing immediate rash, facial/tongue/throat swelling, SOB or lightheadedness with hypotension: No Has patient had a PCN reaction causing severe rash involving mucus membranes or skin necrosis: No Has patient had a PCN reaction that required hospitalization: Yes Has patient had a PCN reaction occurring within the last 10 years: No If all of the above answers are "NO", then may proceed with Cephalosporin use.  Unknown reaction; pt has gotten cefazolin   Soap Other (See Comments)    Unknown reaction    Outpatient Encounter Medications as of 10/14/2023  Medication Sig   albuterol (VENTOLIN HFA) 108 (90 Base) MCG/ACT inhaler Inhale 2 puffs into the lungs every 6 (six) hours as needed for wheezing (wheezing).   carvedilol (COREG) 25 MG tablet Take 1 tablet (25 mg total) by mouth 2 (two) times daily  with a meal.   ferrous sulfate 325 (65 FE) MG tablet Take 1 tablet (325 mg total) by mouth every other day.   fluticasone (FLONASE) 50 MCG/ACT nasal spray Place 1 spray into both nostrils 2 (two) times daily as needed for allergies or rhinitis.   spironolactone (ALDACTONE) 25 MG tablet Take 0.5 tablets (12.5 mg total) by mouth daily.   levETIRAcetam (KEPPRA) 750 MG tablet Take 1 tablet (750 mg total) by mouth 2 (two) times daily. Follow up with your outpatient neurologist, we adjusted this dose based on your kidney function (Patient not taking: Reported on 07/26/2023)   No facility-administered encounter medications on file as of 10/14/2023.    Review of Systems:  Review of Systems  Constitutional:  Negative for appetite change, chills, fatigue and fever.  HENT:  Negative for congestion, hearing loss,  rhinorrhea and sore throat.   Eyes: Negative.   Respiratory:  Negative for cough, shortness of breath and wheezing.   Cardiovascular:  Negative for chest pain, palpitations and leg swelling.  Gastrointestinal:  Negative for abdominal pain, constipation, diarrhea, nausea and vomiting.  Genitourinary:  Negative for dysuria.  Musculoskeletal:  Negative for arthralgias, back pain and myalgias.  Skin:  Positive for rash. Negative for color change and wound.       Itchy rashes on right neck   Neurological:  Negative for dizziness, weakness and headaches.  Psychiatric/Behavioral:  Negative for behavioral problems. The patient is not nervous/anxious.     Health Maintenance  Topic Date Due   Medicare Annual Wellness (AWV)  Never done   Pneumococcal Vaccine 18-80 Years old (2 of 2 - PCV) 03/04/2016   INFLUENZA VACCINE  Never done   COVID-19 Vaccine (1 - 2024-25 season) Never done   DTaP/Tdap/Td (2 - Td or Tdap) 12/12/2024   Cervical Cancer Screening (HPV/Pap Cotest)  03/09/2027   Hepatitis C Screening  Completed   HIV Screening  Completed   HPV VACCINES  Aged Out    Physical Exam: Vitals:    10/14/23 1600  BP: 122/87  Pulse: 84  Resp: 18  Temp: 97.9 F (36.6 C)  SpO2: 99%  Weight: 127 lb (57.6 kg)  Height: 4\' 11"  (1.499 m)   Body mass index is 25.65 kg/m. Physical Exam Constitutional:      Appearance: Normal appearance.  HENT:     Head: Normocephalic and atraumatic.     Nose: Nose normal.     Mouth/Throat:     Mouth: Mucous membranes are moist.  Eyes:     Conjunctiva/sclera: Conjunctivae normal.  Cardiovascular:     Rate and Rhythm: Normal rate and regular rhythm.  Pulmonary:     Effort: Pulmonary effort is normal.     Breath sounds: Normal breath sounds.  Abdominal:     General: Bowel sounds are normal.     Palpations: Abdomen is soft.  Musculoskeletal:        General: Normal range of motion.     Cervical back: Normal range of motion.  Skin:    General: Skin is warm and dry.     Findings: Rash present.     Comments: Patchy dry whitish rashes on right neck, has whitish flakes on her hair  Neurological:     General: No focal deficit present.     Mental Status: She is alert and oriented to person, place, and time.  Psychiatric:        Mood and Affect: Mood normal.        Behavior: Behavior normal.        Thought Content: Thought content normal.        Judgment: Judgment normal.     Labs reviewed: Basic Metabolic Panel: Recent Labs    07/12/23 0432 07/13/23 0311 07/14/23 0426 07/15/23 1047 07/17/23 0759 07/18/23 0823 07/26/23 1151  NA 130*   < > 132*   < > 127* 130* 136  K 2.8*   < > 4.2   < > 4.8 4.6 3.9  CL 93*   < > 95*   < > 94* 96* 102  CO2 25   < > 24   < > 25 23 24   GLUCOSE 110*   < > 102*   < > 92 96 79  BUN 10   < > 24*   < > 32* 30* 12  CREATININE 0.70   < >  1.00   < > 1.38* 1.17* 0.61  CALCIUM 9.0   < > 9.5   < > 9.5 9.6 9.2  MG 1.6*   < > 2.1  --  2.4 2.5*  --   PHOS 4.0  --   --   --   --  4.2  --   TSH 5.420*  --   --   --   --   --   --    < > = values in this interval not displayed.   Liver Function Tests: Recent Labs     07/13/23 0311 07/14/23 0426 07/17/23 0759 07/26/23 1151  AST 130* 113* 109* 199*  ALT 84* 80* 64* 98*  ALKPHOS 65 68 71  --   BILITOT 0.8 1.0 0.6 0.5  PROT 9.3* 10.0* 9.3* 9.7*  ALBUMIN 2.9* 3.1* 2.9*  --    Recent Labs    07/11/23 2314  LIPASE 25   No results for input(s): "AMMONIA" in the last 8760 hours. CBC: Recent Labs    07/14/23 0426 07/17/23 0759 07/18/23 0823  WBC 5.7 3.8* 3.4*  HGB 9.5* 10.2* 10.0*  HCT 32.9* 34.6* 34.4*  MCV 75.1* 74.6* 74.9*  PLT 370 365 415*   Lipid Panel: Recent Labs    07/12/23 0432 07/26/23 1151  CHOL 195 210*  HDL 24* 28*  LDLCALC 131* 657*  TRIG 202* 231*  CHOLHDL 8.1 7.5*   Lab Results  Component Value Date   HGBA1C 5.1 07/12/2023    Procedures since last visit: No results found.  Assessment/Plan  1. Seizure disorder (HCC) (Primary) -  currently not taking Keppra as ordered and does not want to take it -  Seizure-free, last episode linked to alcohol and stress. Neurology follow-up emphasized. - Schedule neurology follow-up appointment.  2. Elevated liver enzymes -  Liver enzymes elevated at 199 U/L. Potential causes include medication or alcohol. Advised to avoid acetaminophen and discussed Biofreeze gel for pain. - Recheck liver enzymes today. - Advised against acetaminophen use. - Consider Biofreeze gel for pain management. - Complete Metabolic Panel with eGFR - Ambulatory referral to Gastroenterology  3. Chronic diastolic CHF (congestive heart failure) (HCC) -  No current dyspnea. Managed with spironolactone and carvedilol. - spironolactone (ALDACTONE) 25 MG tablet; Take 0.5 tablets (12.5 mg total) by mouth daily.  Dispense: 45 tablet; Refill: 0  4. Primary hypertension -  BP 122/78, stable -  continue Carvedilol 25 mg BID  5. Iron deficiency anemia, unspecified iron deficiency anemia type -  Managed with daily ferrous sulfate. - CBC with Differential/Platelets - ferrous sulfate 325 (65 FE) MG  tablet; Take 1 tablet (325 mg total) by mouth every other day.  Dispense: 45 tablet; Refill: 0 - CBC with Differential/Platelet  6. Gastroesophageal reflux disease without esophagitis -  Heartburn and chest pain postprandially, improved with lighter meals.  -  will initiated Pantoprazole. - pantoprazole (PROTONIX) 40 MG tablet; Take 1 tablet (40 mg total) by mouth daily.  Dispense: 30 tablet; Refill: 3  7. Slow transit constipation -  Bowel movements every two to three days causing discomfort. MiraLax recommended. - Recommend MiraLax to promote regular bowel movements.  8. Seborrheic dermatitis -  Dandruff and dry rashes on right neck, likely seborrheic dermatitis. Itching and dryness present. Ketoconazole cream prescribed. - Prescribe ketoconazole 2% cream for daily application to affected areas. - Advise use of Head and Shoulders shampoo twice weekly. - ketoconazole (NIZORAL) 2 % cream; Apply 1 Application topically 2 (two) times daily.  Dispense: 60 g; Refill: 3  9. Mixed hyperlipidemia -  liver enzymes elevated, will not start statin -  discussed diet and exercise   General Health Maintenance Has not visited dentist due to scheduling issues. Emphasized oral hygiene. - Encourage regular dental check-ups. - Advise on maintaining oral hygiene with brushing and gargling.  Follow-up Follow-up needed to monitor liver enzymes and overall health. - Schedule follow-up appointment in one month.     Labs/tests ordered:   CBC and CMP   Tranell Wojtkiewicz Medina-Vargas, NP

## 2023-10-15 LAB — COMPLETE METABOLIC PANEL WITH GFR
AG Ratio: 0.6 (calc) — ABNORMAL LOW (ref 1.0–2.5)
ALT: 54 U/L — ABNORMAL HIGH (ref 6–29)
AST: 111 U/L — ABNORMAL HIGH (ref 10–30)
Albumin: 3.6 g/dL (ref 3.6–5.1)
Alkaline phosphatase (APISO): 65 U/L (ref 31–125)
BUN: 14 mg/dL (ref 7–25)
CO2: 26 mmol/L (ref 20–32)
Calcium: 9.4 mg/dL (ref 8.6–10.2)
Chloride: 102 mmol/L (ref 98–110)
Creat: 0.54 mg/dL (ref 0.50–0.97)
Globulin: 5.9 g/dL — ABNORMAL HIGH (ref 1.9–3.7)
Glucose, Bld: 90 mg/dL (ref 65–139)
Potassium: 4.1 mmol/L (ref 3.5–5.3)
Sodium: 136 mmol/L (ref 135–146)
Total Bilirubin: 0.5 mg/dL (ref 0.2–1.2)
Total Protein: 9.5 g/dL — ABNORMAL HIGH (ref 6.1–8.1)
eGFR: 121 mL/min/{1.73_m2} (ref >=60)

## 2023-10-15 LAB — CBC WITH DIFFERENTIAL/PLATELET
Absolute Lymphocytes: 1056 {cells}/uL (ref 850–3900)
Absolute Monocytes: 259 {cells}/uL (ref 200–950)
Basophils Absolute: 19 {cells}/uL (ref 0–200)
Basophils Relative: 0.4 %
Eosinophils Absolute: 38 {cells}/uL (ref 15–500)
Eosinophils Relative: 0.8 %
HCT: 29.6 % — ABNORMAL LOW (ref 35.0–45.0)
Hemoglobin: 8.3 g/dL — ABNORMAL LOW (ref 11.7–15.5)
MCH: 22.1 pg — ABNORMAL LOW (ref 27.0–33.0)
MCHC: 28 g/dL — ABNORMAL LOW (ref 32.0–36.0)
MCV: 78.9 fL — ABNORMAL LOW (ref 80.0–100.0)
Monocytes Relative: 5.4 %
Neutro Abs: 3427 {cells}/uL (ref 1500–7800)
Neutrophils Relative %: 71.4 %
Platelets: 319 10*3/uL (ref 140–400)
RBC: 3.75 10*6/uL — ABNORMAL LOW (ref 3.80–5.10)
RDW: 15.3 % — ABNORMAL HIGH (ref 11.0–15.0)
Total Lymphocyte: 22 %
WBC: 4.8 10*3/uL (ref 3.8–10.8)

## 2023-10-15 MED ORDER — POLYETHYLENE GLYCOL 3350 17 GM/SCOOP PO POWD: 17.0000 g | Freq: Every day | ORAL | Status: DC

## 2023-10-15 MED ORDER — PANTOPRAZOLE SODIUM 40 MG PO TBEC: 40.0000 mg | DELAYED_RELEASE_TABLET | Freq: Every day | ORAL | 3 refills | Status: DC

## 2023-10-15 NOTE — Progress Notes (Signed)
-   AST 111, down from 199 -  ALT 54, down from 98 -  liver enzymes improved but still elevated, will refer to GI -  hgb 8.3, down from 10.0 (07/18/23), was she taking Ferrous sulfate, does she have any bloody stools?

## 2023-10-17 ENCOUNTER — Ambulatory Visit: Payer: Self-pay | Admitting: Adult Health

## 2023-10-17 NOTE — Telephone Encounter (Signed)
 Chief Complaint: abd pain Symptoms: cramps; defecating on self  Frequency: anytime bending over   Disposition: [] ED /[] Urgent Care (no appt availability in office) / [x] Appointment(In office/virtual)/ []  [place] [facility]/ [] Home Care/ [] Refused Recommended Disposition /[] [place] [facility]/ []  Follow-up with PCP Additional Notes: Pt complaining of abd pain. "Stomach hurts in middle." Pt states any and every time she bent over today, she would defecate on herself. Pt stated it wasn't diarrhea, but formed stool. Pt stated she wouldn't know until she went to the bathroom later and noticed. Pt states she sits on toilet and very little comes out. Pt has an app tomorrow at 1520. Pt is unsure if she can keep it. Pt stated, "I need to check with my step-mom." RN advised pt to call back if she needs to cancel. RN gave care advice and pt verbalized understanding.               Copied from CRM [id number]. Topic: Clinical - Red Word Triage >> Oct 17, 2023  4:55 PM Corin V wrote: [facility] that prompted transfer to Nurse Triage: Patient is having pain when she is defecating as well aw when she bends over.  She wants to know if there is possibly an issue with her stomach lining as she gets a sharp pain when she eats. Reason for Disposition  [1] MODERATE pain (e.g., interferes with normal activities) AND [2] pain comes and goes (cramps) AND [3] present > 24 hours  (Exception: Pain with Vomiting or Diarrhea - see that Guideline.)  Answer Assessment - Initial Assessment Questions 1. LOCATION: "Where does it hurt?"      Middle  2. RADIATION: "Does the pain shoot anywhere else?" (e.g., chest, back)     no 3. ONSET: "When did the pain begin?" (e.g., minutes, hours or days ago)      This morning  4. SUDDEN: "Gradual or sudden onset?"     Gradual  5. PATTERN "Does the pain come and go, or is it constant?"    - If it comes and goes: "How long does it last?" "Do you have pain now?"     (Note:  Comes and goes means the pain is intermittent. It goes away completely between bouts.)    - If constant: "Is it getting better, staying the same, or getting worse?"      (Note: Constant means the pain never goes away completely; most serious pain is constant and gets worse.)      Every time she bends over 6. SEVERITY: "How bad is the pain?"  (e.g., Scale 1-10; mild, moderate, or severe)    - MILD (1-3): Doesn't interfere with normal activities, abdomen soft and not tender to touch.     - MODERATE (4-7): Interferes with normal activities or awakens from sleep, abdomen tender to touch.     - SEVERE (8-10): Excruciating pain, doubled over, unable to do any normal activities.       6-7 7. RECURRENT SYMPTOM: "Have you ever had this type of stomach pain before?" If Yes, ask: "When was the last time?" and "What happened that time?"      no 8. CAUSE: "What do you think is causing the stomach pain?"     Not sure  9. RELIEVING/AGGRAVATING FACTORS: "What makes it better or worse?" (e.g., antacids, bending or twisting motion, bowel movement)     Bending over 10. OTHER SYMPTOMS: "Do you have any other symptoms?" (e.g., back pain, diarrhea, fever, urination pain, vomiting)  Defecting on  oneself  Protocols used: Abdominal Pain - Female-A-AH

## 2023-10-17 NOTE — Telephone Encounter (Signed)
 Copied from CRM [id number]. Topic: Clinical - Lab/Test Results >> Oct 17, 2023  5:25 PM Antony Haste wrote: Reason for CRM: The patient's stepmother Pattricia Boss has additional questions pertaining to Elenora's recent lab results from 03/11.   Patient's step mother called with the patient for clarification for lab results. I have given them the results and answered their questions.   They report that the patient is not currently taking her Ferrous Sulfate and was unsure if she was still supposed to be taking it. They would like a call back with clarification. They denied any blood in her stool.    Reason for Disposition  Caller requesting lab results  (Exception: Routine or non-urgent lab result.)  Answer Assessment - Initial Assessment Questions 1. REASON FOR CALL or QUESTION: "What is your reason for calling today?" or "How can I best help you?" or "What question do you have that I can help answer?"     Lab result clarifications  2. CALLER: Document the source of call. (e.g., laboratory, patient).     Patient and step-mother  Protocols used: PCP Call - No Triage-A-AH

## 2023-10-18 ENCOUNTER — Other Ambulatory Visit: Payer: Self-pay | Admitting: Adult Health

## 2023-10-18 ENCOUNTER — Encounter: Admitting: Nurse Practitioner

## 2023-10-18 DIAGNOSIS — D509 Iron deficiency anemia, unspecified: Secondary | ICD-10-CM

## 2023-10-18 MED ORDER — FERROUS SULFATE 325 (65 FE) MG PO TABS: 325.0000 mg | ORAL_TABLET | Freq: Every day | ORAL | 0 refills | Status: DC

## 2023-10-18 NOTE — Telephone Encounter (Signed)
 Forwarded to [employer]:  "They report that the patient is not currently taking her Ferrous Sulfate and was unsure if she was still supposed to be taking it. They would like a call back with clarification. They denied any blood in her stool. "  Please Advise.

## 2023-10-18 NOTE — Telephone Encounter (Signed)
 Patient seen today bu Abbey Chatters, NP

## 2023-10-18 NOTE — Telephone Encounter (Signed)
 I have discussed Iron supplement during the visit and even sent eRx. I have now changed FeSO4 325 mg to daily and sent eRx to pharmacy. She needs to follow up in 2 weeks for lab. I am putting in orders for  a repeat CBC in 2 weeks.

## 2023-10-18 NOTE — Addendum Note (Signed)
 Addended by: Kenard Gower C on: 10/18/2023 09:32 AM   Modules accepted: Orders

## 2023-10-18 NOTE — Telephone Encounter (Signed)
 Tried calling patient, Mailbox is full and cannot leave message. Will try again later.

## 2023-10-18 NOTE — Progress Notes (Signed)
 This encounter was created in error - please disregard.

## 2023-10-23 ENCOUNTER — Telehealth: Payer: Self-pay

## 2023-10-23 NOTE — Telephone Encounter (Signed)
 Copied from CRM [id number]. Topic: Referral - Question >> Oct 23, 2023  1:28 PM Maree Krabbe H wrote: Reason for CRM: Patients stepmother called and stated that the patient was referred to neuro but it was not in her network, patients stepmother is calling to see if she can be referred to a provider within network. [name] is the one who called and her callback number is [phone].   Message routed to referral coordinator for further follow-up

## 2023-10-24 NOTE — Telephone Encounter (Signed)
 Noted.

## 2023-10-24 NOTE — Telephone Encounter (Signed)
 Per her insurance, it covers [facility]. There are no other providers in [place] that take her insurance, other than a HA specialist and a TEFL teacher. I will call her and let her know.

## 2023-10-28 ENCOUNTER — Ambulatory Visit: Admitting: Podiatry

## 2023-11-12 ENCOUNTER — Encounter: Payer: Self-pay | Admitting: Podiatry

## 2023-11-12 ENCOUNTER — Ambulatory Visit (INDEPENDENT_AMBULATORY_CARE_PROVIDER_SITE_OTHER): Admitting: Podiatry

## 2023-11-12 DIAGNOSIS — M79674 Pain in right toe(s): Secondary | ICD-10-CM

## 2023-11-12 DIAGNOSIS — M79675 Pain in left toe(s): Secondary | ICD-10-CM | POA: Diagnosis not present

## 2023-11-12 DIAGNOSIS — B351 Tinea unguium: Secondary | ICD-10-CM | POA: Diagnosis not present

## 2023-11-12 DIAGNOSIS — G809 Cerebral palsy, unspecified: Secondary | ICD-10-CM

## 2023-11-12 NOTE — Progress Notes (Signed)

## 2023-11-14 ENCOUNTER — Encounter: Admitting: Adult Health

## 2023-11-14 NOTE — Progress Notes (Signed)
 This encounter was created in error - please disregard.

## 2023-11-26 ENCOUNTER — Telehealth: Payer: Self-pay | Admitting: Adult Health

## 2023-11-26 NOTE — Telephone Encounter (Signed)
 Patient stepmother dropped off a form to be completed for medical clearance for a dental office. Need it completed and faxed and inform them when done patient is having her procedure on 12/05/2023.

## 2023-11-27 NOTE — Telephone Encounter (Signed)
 Form was placed in Medina-Vargas, Monina C, NP review and sign folder

## 2023-11-29 NOTE — Telephone Encounter (Signed)
 Form completed and gave to front desk personnel, 11/29/23.

## 2023-11-29 NOTE — Telephone Encounter (Signed)
 Left message on voicemail for patient to return call when available

## 2023-12-10 ENCOUNTER — Other Ambulatory Visit: Payer: Self-pay | Admitting: Adult Health

## 2023-12-10 DIAGNOSIS — I10 Essential (primary) hypertension: Secondary | ICD-10-CM

## 2023-12-10 MED ORDER — CARVEDILOL 25 MG PO TABS
25.0000 mg | ORAL_TABLET | Freq: Two times a day (BID) | ORAL | Status: DC
Start: 2023-12-10 — End: 2023-12-13

## 2023-12-10 NOTE — Telephone Encounter (Signed)
 Copied from CRM [id number]. Topic: Clinical - Medication Refill >> Dec 10, 2023 11:56 AM Tisa Forester wrote: Most Recent Primary Care Visit:  Provider: Duncan Gibson  Department: [facility]  Visit Type: OFFICE VISIT  Date: 10/14/2023  Medication: carvedilol  (COREG ) 25 MG tablet  Has the patient contacted their pharmacy? Yes , pharmacy did not have the request sent in  (Agent: If no, request that the patient contact the pharmacy for the refill. If patient does not wish to contact the pharmacy document the reason why and proceed with request.) (Agent: If yes, when and what did the pharmacy advise?)  Is this the correct pharmacy for this prescription? Yes If no, delete pharmacy and type the correct one.  This is the patient's preferred pharmacy:  [facility] [phone] - [place], [place] - [address] AT [facility] [address] [facility] [address] [place] [address] Phone: [phone] Fax: [phone]  [facility] [address] [place] [address] Phone: [phone] Fax: [phone]   Has the prescription been filled recently? No  Is the patient out of the medication? Yes  Has the patient been seen for an appointment in the last year OR does the patient have an upcoming appointment? Yes  Can we respond through MyChart? No  Agent: Please be advised that Rx refills may take up to 3 business days. We ask that you follow-up with your pharmacy.

## 2023-12-12 ENCOUNTER — Ambulatory Visit: Admitting: Podiatry

## 2023-12-13 ENCOUNTER — Telehealth: Payer: Self-pay | Admitting: *Deleted

## 2023-12-13 DIAGNOSIS — I10 Essential (primary) hypertension: Secondary | ICD-10-CM

## 2023-12-13 MED ORDER — CARVEDILOL 25 MG PO TABS: 25.0000 mg | ORAL_TABLET | Freq: Two times a day (BID) | ORAL | 5 refills | Status: DC

## 2023-12-13 NOTE — Telephone Encounter (Signed)
 Copied from CRM [id number]. Topic: Clinical - Prescription Issue >> Majd Tissue 9, 2025 10:29 AM Brynn Caras wrote: Reason for CRM: Winthrop Hawks, the step mother of the patient states she spoke with [facility] to determine if her carvedilol  (COREG ) 25 MG tablet was sent on 12/10/2023. [facility] states they have not received the order yet. Ivin Marrow confirmed that the patient is completely out of this medication and will need this refilled before the end of today for her hypertension.

## 2023-12-13 NOTE — Telephone Encounter (Signed)
 Chart reviewed and medication was requested on 5/6 but the Rx was set to "no print"  Medication sent to pharmacy as requested.

## 2023-12-16 ENCOUNTER — Telehealth: Payer: Self-pay | Admitting: *Deleted

## 2023-12-16 DIAGNOSIS — I10 Essential (primary) hypertension: Secondary | ICD-10-CM

## 2023-12-16 MED ORDER — CARVEDILOL 25 MG PO TABS: 25.0000 mg | ORAL_TABLET | Freq: Two times a day (BID) | ORAL | 5 refills | Status: DC

## 2023-12-16 NOTE — Telephone Encounter (Signed)
 Copied from CRM [id number]. Topic: Clinical - Prescription Issue >> Michaeljohn Biss 12, 2025  9:55 AM Arlie Benedict B wrote: Reason for CRM: Patient's step mother Ms. Winthrop Hawks call to check prescription status I advised it was sent on the 9th of Camille Thau she stated the pharmacy to her they had not yet received any refills request for the patient's carvedilol  (COREG ) 25 MG tablet [phone]   Refill resubmitted to pharmacy.

## 2023-12-24 ENCOUNTER — Ambulatory Visit (INDEPENDENT_AMBULATORY_CARE_PROVIDER_SITE_OTHER): Admitting: Podiatry

## 2023-12-24 DIAGNOSIS — M79674 Pain in right toe(s): Secondary | ICD-10-CM

## 2023-12-24 DIAGNOSIS — B351 Tinea unguium: Secondary | ICD-10-CM

## 2023-12-24 DIAGNOSIS — G809 Cerebral palsy, unspecified: Secondary | ICD-10-CM

## 2023-12-24 DIAGNOSIS — M79675 Pain in left toe(s): Secondary | ICD-10-CM

## 2023-12-24 NOTE — Progress Notes (Signed)
 Patient was scheduled to return at 4 weeks not 4 months.  RTC 3 months.  GAM

## 2024-01-20 ENCOUNTER — Other Ambulatory Visit: Payer: Self-pay | Admitting: Adult Health

## 2024-01-20 DIAGNOSIS — I5032 Chronic diastolic (congestive) heart failure: Secondary | ICD-10-CM

## 2024-01-27 ENCOUNTER — Ambulatory Visit: Payer: Self-pay

## 2024-01-27 NOTE — Telephone Encounter (Signed)
 FYI Only or Action Required?: FYI only for provider.  Patient was last seen in primary care on 10/14/2023 by Medina-Vargas, Jereld BROCKS, NP. Called Nurse Triage reporting Headache. Symptoms began yesterday. Interventions attempted: Nothing. Symptoms are: gradually worsening.  Triage Disposition: See Physician Within 24 Hours  Patient/caregiver understands and will follow disposition?: Yes  Copied from CRM [id number]. Topic: Clinical - Red Word Triage >> Jan 27, 2024  5:13 PM Tiffany H wrote: Red Word that prompted transfer to Nurse Triage: Patient called to advise that she's been having intermittent headaches. Patient advised that she can't keep anything down - she just threw up water . There is pressure at the top and back of her head. She is sensitive to light and gets nauseated and dizzy. Please assist. Reason for Disposition  [1] MODERATE headache (e.g., interferes with normal activities) AND [2] present > 24 hours AND [3] unexplained  (Exceptions: analgesics not tried, typical migraine, or headache part of viral illness)  [1] MODERATE dizziness (e.g., interferes with normal activities) AND [2] has NOT been evaluated by doctor (or NP/PA) for this  (Exception: Dizziness caused by heat exposure, sudden standing, or poor fluid intake.)  Unexplained nausea  Answer Assessment - Initial Assessment Questions 1. LOCATION: Where does it hurt?      Headache mostly top & back head hurt worse 2. ONSET: When did the headache start? (Minutes, hours or days)      yesterday 3. PATTERN: Does the pain come and go, or has it been constant since it started?     Comes and goes 4. SEVERITY: How bad is the pain? and What does it keep you from doing?  (e.g., Scale 1-10; mild, moderate, or severe)   - MILD (1-3): doesn't interfere with normal activities    - MODERATE (4-7): interferes with normal activities or awakens from sleep    - SEVERE (8-10): excruciating pain, unable to do any normal activities         10/10 5. RECURRENT SYMPTOM: Have you ever had headaches before? If Yes, ask: When was the last time? and What happened that time?      no 6. CAUSE: What do you think is causing the headache?     unknown 7. MIGRAINE: Have you been diagnosed with migraine headaches? If Yes, ask: Is this headache similar?      N/a 8. HEAD INJURY: Has there been any recent injury to the head?      N/a 9. OTHER SYMPTOMS: Do you have any other symptoms? (fever, stiff neck, eye pain, sore throat, cold symptoms)     Sensitive to light, dizzy/lightheaded 10. PREGNANCY: Is there any chance you are pregnant? When was your last menstrual period?       N/a  Answer Assessment - Initial Assessment Questions 1. DESCRIPTION: Describe your dizziness.     Dizzy 2. LIGHTHEADED: Do you feel lightheaded? (e.g., somewhat faint, woozy, weak upon standing)     woozy 3. VERTIGO: Do you feel like either you or the room is spinning or tilting? (i.e. vertigo)     N/a no 4. SEVERITY: How bad is it?  Do you feel like you are going to faint? Can you stand and walk?   - MILD: Feels slightly dizzy, but walking normally.   - MODERATE: Feels unsteady when walking, but not falling; interferes with normal activities (e.g., school, work).   - SEVERE: Unable to walk without falling, or requires assistance to walk without falling; feels like passing out now.  Mild to moderate 5. ONSET:  When did the dizziness begin?     yesterday 6. AGGRAVATING FACTORS: Does anything make it worse? (e.g., standing, change in head position)     N/a 7. HEART RATE: Can you tell me your heart rate? How many beats in 15 seconds?  (Note: not all patients can do this)       N/a 8. CAUSE: What do you think is causing the dizziness?     unknown 9. RECURRENT SYMPTOM: Have you had dizziness before? If Yes, ask: When was the last time? What happened that time?     N/a 10. OTHER SYMPTOMS: Do you have any other  symptoms? (e.g., fever, chest pain, vomiting, diarrhea, bleeding)       Lightheadedness and dizzy, n/v 11. PREGNANCY: Is there any chance you are pregnant? When was your last menstrual period?       N/a  Answer Assessment - Initial Assessment Questions 1. NAUSEA SEVERITY: How bad is the nausea? (e.g., mild, moderate, severe; dehydration, weight loss)   - MILD: loss of appetite without change in eating habits   - MODERATE: decreased oral intake without significant weight loss, dehydration, or malnutrition   - SEVERE: inadequate caloric or fluid intake, significant weight loss, symptoms of dehydration     Mild 2. ONSET: When did the nausea begin?     yesterday 3. VOMITING: Any vomiting? If Yes, ask: How many times today?     yes 4. RECURRENT SYMPTOM: Have you had nausea before? If Yes, ask: When was the last time? What happened that time?     no 5. CAUSE: What do you think is causing the nausea?     unknown 6. PREGNANCY: Is there any chance you are pregnant? (e.g., unprotected intercourse, missed birth control pill, broken condom)     no  Protocols used: Headache-A-AH, Dizziness - Lightheadedness-A-AH, Nausea-A-AH

## 2024-01-28 ENCOUNTER — Telehealth: Payer: Self-pay | Admitting: *Deleted

## 2024-01-28 ENCOUNTER — Ambulatory Visit: Admitting: Adult Health

## 2024-01-28 NOTE — Telephone Encounter (Signed)
 Copied from CRM [id number]. Topic: General - Other >> Jan 28, 2024  2:41 PM Miquel SAILOR wrote: Reason for CRM: Patient step mom Me. Wells needs call back on what patiens can do for pain due to can not take pain medication.  [phone]    Patient was seen today by Monina in office.  Please Advise.

## 2024-01-28 NOTE — Telephone Encounter (Signed)
 Patient is coming in the office to be seen just FYI please see Triage Notes from Triage Nurse.  Medina-Vargas, Monina C, NP has been notified.   Message sent to Medina-Vargas, Monina C, NP

## 2024-01-29 NOTE — Telephone Encounter (Signed)
 Called and spoke to stepmother Zelda Dixons. I offered in office appointment tomorrow 01/30/2024 at 3pm. She stated that she will have to call the office back and let us  know. Due to having small children and needing to work around a scheduled time.

## 2024-01-29 NOTE — Progress Notes (Signed)
 This encounter was created in error - please disregard.

## 2024-01-29 NOTE — Telephone Encounter (Signed)
 What is painful? Does she needs to be seen to follow up on liver enzymes? She needs to be seen in the office for evaluation.

## 2024-02-14 ENCOUNTER — Ambulatory Visit: Admitting: Adult Health

## 2024-02-14 DIAGNOSIS — I10 Essential (primary) hypertension: Secondary | ICD-10-CM

## 2024-02-14 DIAGNOSIS — D509 Iron deficiency anemia, unspecified: Secondary | ICD-10-CM

## 2024-02-14 DIAGNOSIS — K219 Gastro-esophageal reflux disease without esophagitis: Secondary | ICD-10-CM

## 2024-02-14 DIAGNOSIS — E782 Mixed hyperlipidemia: Secondary | ICD-10-CM

## 2024-02-14 DIAGNOSIS — R748 Abnormal levels of other serum enzymes: Secondary | ICD-10-CM

## 2024-02-14 DIAGNOSIS — G8929 Other chronic pain: Secondary | ICD-10-CM

## 2024-02-14 DIAGNOSIS — G40909 Epilepsy, unspecified, not intractable, without status epilepticus: Secondary | ICD-10-CM

## 2024-02-19 ENCOUNTER — Other Ambulatory Visit (HOSPITAL_COMMUNITY): Payer: Self-pay

## 2024-03-06 ENCOUNTER — Ambulatory Visit (INDEPENDENT_AMBULATORY_CARE_PROVIDER_SITE_OTHER): Admitting: Adult Health

## 2024-03-06 ENCOUNTER — Encounter: Payer: Self-pay | Admitting: Adult Health

## 2024-03-06 VITALS — BP 126/89 | HR 77 | Temp 97.6°F | Resp 18 | Ht 59.0 in | Wt 110.2 lb

## 2024-03-06 DIAGNOSIS — J45909 Unspecified asthma, uncomplicated: Secondary | ICD-10-CM

## 2024-03-06 DIAGNOSIS — R634 Abnormal weight loss: Secondary | ICD-10-CM | POA: Diagnosis not present

## 2024-03-06 DIAGNOSIS — R748 Abnormal levels of other serum enzymes: Secondary | ICD-10-CM | POA: Diagnosis not present

## 2024-03-06 DIAGNOSIS — G40909 Epilepsy, unspecified, not intractable, without status epilepticus: Secondary | ICD-10-CM

## 2024-03-06 DIAGNOSIS — E782 Mixed hyperlipidemia: Secondary | ICD-10-CM

## 2024-03-06 DIAGNOSIS — I5032 Chronic diastolic (congestive) heart failure: Secondary | ICD-10-CM

## 2024-03-06 DIAGNOSIS — L819 Disorder of pigmentation, unspecified: Secondary | ICD-10-CM

## 2024-03-06 DIAGNOSIS — K0510 Chronic gingivitis, plaque induced: Secondary | ICD-10-CM

## 2024-03-06 DIAGNOSIS — M2550 Pain in unspecified joint: Secondary | ICD-10-CM

## 2024-03-06 DIAGNOSIS — G8929 Other chronic pain: Secondary | ICD-10-CM | POA: Diagnosis not present

## 2024-03-06 DIAGNOSIS — I10 Essential (primary) hypertension: Secondary | ICD-10-CM

## 2024-03-06 DIAGNOSIS — K219 Gastro-esophageal reflux disease without esophagitis: Secondary | ICD-10-CM

## 2024-03-06 MED ORDER — SPIRONOLACTONE 25 MG PO TABS: 12.5000 mg | ORAL_TABLET | Freq: Every day | ORAL | 3 refills | Status: DC

## 2024-03-06 NOTE — Progress Notes (Signed)
 [facility]  Provider:  Jereld Serum DNP  Code Status:  Full Code  Goals of Care:     Chief Complaint  Patient presents with   Referral    Referral for pain management    Discussed the use of AI scribe software for clinical note transcription with the patient, who gave verbal consent to proceed.  HPI: Patient is a 38 y.o. female seen today for an acute visit for pain management referral.  She was accompanied by her stepmother.  She experiences widespread pain, primarily in her left arm, head, and occasionally in her legs. The pain is severe, rated between 9 and 10 out of 10, although she is not in pain at the moment. It occurs daily, often during church time, and then subsides before returning. Her history of elevated liver enzymes limits her ability to take certain pain medications.  She has experienced significant weight loss, dropping from 128 pounds in December 2024 to 110 pounds currently. Despite having a good appetite, she sometimes spits out food due to mouth pain. She had a bottom molar extracted due to severe pain and swelling, and she continues to experience gum bleeding and swelling, which she attributes to gingivitis.  She reports skin discoloration, described as whitish patches on her neck and right side, which do not itch or hurt.   She experiences sharp abdominal pain, particularly on the left side, and occasional chest pain that started yesterday. No fever, but she mentions shortness of breath, which she attributes to asthma. She also reports a history of acid reflux, which she manages with medication.  Her current medications include Keppra  750 mg twice a day for seizures, rosuvastatin  5 mg daily for mixed hyperlipidemia, carvedilol  25 mg twice a day for hypertension, and Aldactone  12.5 mg for congestive heart failure. She has not taken her medications today.    Past Medical History:  Diagnosis Date   Allergic rhinitis    Anemia    of other chronic  disease   Anxiety    Asthma    Back pain    unknown    Cerebral palsy (HCC)    Headache(784.0)    frequently   Hypertension    Incontinence of urine    Liver disease    Denies 09/10/2018   Low sodium diet    Lumbago 05/21/2014   pain in lower back    Muscle spasm    takes Flexeril  daily   Neuromuscular disorder (HCC)    cerebral palsy   Numbness    bilateral feet   Peripheral edema    occasionally   PONV (postoperative nausea and vomiting)    Rhegmatogenous retinal detachment of both eyes    Seizure disorder (HCC)    Seizures (HCC)    last one in high school;takes Depakote  daily   Wears glasses     Past Surgical History:  Procedure Laterality Date   CESAREAN SECTION  2007   CESAREAN SECTION WITH BILATERAL TUBAL LIGATION Bilateral 03/03/2015   Procedure: CESAREAN SECTION WITH BILATERAL TUBAL LIGATION;  Surgeon: Elveria Mungo, MD;  Location: [facility];  Service: Obstetrics;  Laterality: Bilateral;  Requested 03/03/15 @ 3:30p  Ok per Cleo/Myra-TM   GAS/FLUID EXCHANGE Right 09/11/2018   Procedure: Gas/Fluid Exchange RIGHT EYE;  Surgeon: Valdemar Rogue, MD;  Location: [facility] OR;  Service: Ophthalmology;  Laterality: Right;  [id number]   LASER PHOTO ABLATION Left 09/11/2018   Procedure: LASER PHOTO ABLATION LEFT EYE;  Surgeon: Valdemar Rogue, MD;  Location: [facility] OR;  Service: Ophthalmology;  Laterality: Left;   LEG SURGERY Bilateral    PARS PLANA VITRECTOMY Right 09/11/2018   Procedure: PARS PLANA VITRECTOMY WITH 25 GAUGE RIGHT EYE;  Surgeon: Valdemar Rogue, MD;  Location: [facility] OR;  Service: Ophthalmology;  Laterality: Right;   PERFLUORONE INJECTION Right 09/11/2018   Procedure: Perfluorone Injection RIGHT EYE;  Surgeon: Valdemar Rogue, MD;  Location: [facility] OR;  Service: Ophthalmology;  Laterality: Right;   PHOTOCOAGULATION WITH LASER Right 09/11/2018   Procedure: Photocoagulation With Laser RIGHT EYE;  Surgeon: Valdemar Rogue, MD;  Location: [facility] OR;  Service: Ophthalmology;  Laterality: Right;   SCLERAL  BUCKLE Right 09/11/2018   Procedure: Scleral Buckle RIGHT EYE;  Surgeon: Valdemar Rogue, MD;  Location: [facility] OR;  Service: Ophthalmology;  Laterality: Right;   TOOTH EXTRACTION N/A 02/16/2013   Procedure: EXTRACTION 16, 17, 32;  Surgeon: Glendia CHRISTELLA Primrose, DDS;  Location: [facility] OR;  Service: Oral Surgery;  Laterality: N/A;    Allergies  Allergen Reactions   Shrimp [Shellfish Allergy] Anaphylaxis    Can eat other shellfish   Codeine Other (See Comments)    GI upset   Dust Mite Extract Other (See Comments)    Unknown reaction   Mold Extract [Trichophyton] Other (See Comments)    Unknown reaction   Penicillins Hives and Other (See Comments)    Has patient had a PCN reaction causing immediate rash, facial/tongue/throat swelling, SOB or lightheadedness with hypotension: No Has patient had a PCN reaction causing severe rash involving mucus membranes or skin necrosis: No Has patient had a PCN reaction that required hospitalization: Yes Has patient had a PCN reaction occurring within the last 10 years: No If all of the above answers are NO, then may proceed with Cephalosporin use.  Unknown reaction; pt has gotten cefazolin    Soap Other (See Comments)    Unknown reaction    Outpatient Encounter Medications as of 03/06/2024  Medication Sig   albuterol  (VENTOLIN  HFA) 108 (90 Base) MCG/ACT inhaler Inhale 2 puffs into the lungs every 6 (six) hours as needed for wheezing (wheezing).   carvedilol  (COREG ) 25 MG tablet Take 1 tablet (25 mg total) by mouth 2 (two) times daily with a meal.   ferrous sulfate  325 (65 FE) MG tablet Take 1 tablet (325 mg total) by mouth daily.   fluticasone  (FLONASE ) 50 MCG/ACT nasal spray Place 1 spray into both nostrils 2 (two) times daily as needed for allergies or rhinitis.   ketoconazole  (NIZORAL ) 2 % cream Apply 1 Application topically 2 (two) times daily.   pantoprazole  (PROTONIX ) 40 MG tablet Take 1 tablet (40 mg total) by mouth daily.   polyethylene glycol powder  (GLYCOLAX /MIRALAX ) 17 GM/SCOOP powder Take 17 g by mouth daily.   rosuvastatin  (CRESTOR ) 5 MG tablet Take 5 mg by mouth daily.   spironolactone  (ALDACTONE ) 25 MG tablet TAKE 1/2 TABLET(12.5 MG) BY MOUTH DAILY   levETIRAcetam  (KEPPRA ) 750 MG tablet Take 1 tablet (750 mg total) by mouth 2 (two) times daily. Follow up with your outpatient neurologist, we adjusted this dose based on your kidney function (Patient not taking: Reported on 03/06/2024)   No facility-administered encounter medications on file as of 03/06/2024.    Review of Systems:  Review of Systems  Constitutional:  Negative for appetite change, chills, fatigue and fever.  HENT:  Negative for congestion, hearing loss, rhinorrhea and sore throat.        Gum bleeding  Eyes: Negative.   Respiratory:  Negative for cough, shortness of breath and wheezing.  Cardiovascular:  Negative for chest pain, palpitations and leg swelling.  Gastrointestinal:  Negative for abdominal pain, constipation, diarrhea, nausea and vomiting.  Genitourinary:  Negative for dysuria.  Musculoskeletal:  Negative for arthralgias, back pain and myalgias.  Skin:  Negative for color change, rash and wound.  Neurological:  Negative for dizziness, weakness and headaches.  Psychiatric/Behavioral:  Negative for behavioral problems. The patient is not nervous/anxious.     Health Maintenance  Topic Date Due   Medicare Annual Wellness (AWV)  Never done   Hepatitis B Vaccines (1 of 3 - 19+ 3-dose series) Never done   HPV VACCINES (1 - 3-dose SCDM series) Never done   Pneumococcal Vaccine: 19-49 Years (2 of 2 - PCV) 03/04/2016   COVID-19 Vaccine (1 - 2024-25 season) Never done   INFLUENZA VACCINE  03/06/2024   DTaP/Tdap/Td (2 - Td or Tdap) 12/12/2024   Cervical Cancer Screening (HPV/Pap Cotest)  03/09/2027   Hepatitis C Screening  Completed   HIV Screening  Completed   Meningococcal B Vaccine  Aged Out    Physical Exam: Vitals:   03/06/24 1258  BP: 126/89   Pulse: 77  Resp: 18  Temp: 97.6 F (36.4 C)  SpO2: 98%  Weight: 110 lb 3.2 oz (50 kg)  Height: 4' 11 (1.499 m)   Body mass index is 22.26 kg/m. Physical Exam Constitutional:      Appearance: Normal appearance.  HENT:     Head: Normocephalic and atraumatic.     Nose: Nose normal.     Mouth/Throat:     Mouth: Mucous membranes are moist.  Eyes:     Conjunctiva/sclera: Conjunctivae normal.  Cardiovascular:     Rate and Rhythm: Normal rate and regular rhythm.  Pulmonary:     Effort: Pulmonary effort is normal.     Breath sounds: Normal breath sounds.  Abdominal:     General: Bowel sounds are normal.     Palpations: Abdomen is soft.  Musculoskeletal:        General: Normal range of motion.     Cervical back: Normal range of motion.  Skin:    General: Skin is warm and dry.  Neurological:     General: No focal deficit present.     Mental Status: She is alert and oriented to person, place, and time.  Psychiatric:        Mood and Affect: Mood normal.        Behavior: Behavior normal.        Thought Content: Thought content normal.        Judgment: Judgment normal.     Labs reviewed: Basic Metabolic Panel: Recent Labs    07/12/23 0432 07/13/23 0311 07/14/23 0426 07/15/23 1047 07/17/23 0759 07/18/23 0823 07/26/23 1151 10/14/23 1640  NA 130*   < > 132*   < > 127* 130* 136 136  K 2.8*   < > 4.2   < > 4.8 4.6 3.9 4.1  CL 93*   < > 95*   < > 94* 96* 102 102  CO2 25   < > 24   < > 25 23 24 26   GLUCOSE 110*   < > 102*   < > 92 96 79 90  BUN 10   < > 24*   < > 32* 30* 12 14  CREATININE 0.70   < > 1.00   < > 1.38* 1.17* 0.61 0.54  CALCIUM  9.0   < > 9.5   < > 9.5 9.6 9.2  9.4  MG 1.6*   < > 2.1  --  2.4 2.5*  --   --   PHOS 4.0  --   --   --   --  4.2  --   --   TSH 5.420*  --   --   --   --   --   --   --    < > = values in this interval not displayed.   Liver Function Tests: Recent Labs    07/13/23 0311 07/14/23 0426 07/17/23 0759 07/26/23 1151 10/14/23 1640   AST 130* 113* 109* 199* 111*  ALT 84* 80* 64* 98* 54*  ALKPHOS 65 68 71  --   --   BILITOT 0.8 1.0 0.6 0.5 0.5  PROT 9.3* 10.0* 9.3* 9.7* 9.5*  ALBUMIN  2.9* 3.1* 2.9*  --   --    Recent Labs    07/11/23 2314  LIPASE 25   No results for input(s): AMMONIA in the last 8760 hours. CBC: Recent Labs    07/17/23 0759 07/18/23 0823 10/14/23 1640  WBC 3.8* 3.4* 4.8  NEUTROABS  --   --  3,427  HGB 10.2* 10.0* 8.3*  HCT 34.6* 34.4* 29.6*  MCV 74.6* 74.9* 78.9*  PLT 365 415* 319   Lipid Panel: Recent Labs    07/12/23 0432 07/26/23 1151  CHOL 195 210*  HDL 24* 28*  LDLCALC 131* 854*  TRIG 202* 231*  CHOLHDL 8.1 7.5*   Lab Results  Component Value Date   HGBA1C 5.1 07/12/2023    Procedures since last visit: No results found.  Assessment/Plan  1. Other chronic pain (Primary) -  Chronic pain in left arm, head, and legs rated 9-10/10 daily. Pain management limited by elevated liver enzymes. Behavioral outbursts may relate to pain. - Ambulatory referral to Pain Clinic  2. Elevated liver enzymes - Comprehensive metabolic panel to follow up liver enzyme levels  3. Unintentional weight loss -  Significant weight loss from 128 lbs to 110 lbs despite good appetite. - Advise drinking Ensure for nutritional supplementation. - Order CBC and CMP to assess nutritional status and liver function. - Hemoglobin A1C  4. Gingivitis -  Gingivitis with bleeding due to lack of regular flossing. Indicates gingival inflammation. - Advise flossing once a day. - Advise brushing teeth at least twice a day. - Advise gargling with mouthwash. -  follow up with dentist - CBC with Differential/Platelets  5. Chronic diastolic CHF (congestive heart failure) (HCC) -  Managed with Aldactone  12.5 mg. daily - spironolactone  (ALDACTONE ) 25 MG tablet; Take 0.5 tablets (12.5 mg total) by mouth daily.  Dispense: 45 tablet; Refill: 3  6. Primary hypertension -  Managed with carvedilol  25 mg twice  a day. Current BP 136/89 mmHg. Missed dose today. - Ensure adherence to carvedilol  regimen.  7. Mixed hyperlipidemia -  Managed with rosuvastatin  5 mg daily. Includes vegetable intake in diet. - rosuvastatin  (CRESTOR ) 5 MG tablet; Take 5 mg by mouth daily. - Lipid panel  8. Seizure disorder (HCC) -  Managed with Keppra  750 mg twice a day. No recent seizures reported.  9. Gastroesophageal reflux disease without esophagitis -  Occasional sharp abdominal pain possibly related to acid reflux. -  continue Crestor  5 mg daily  10. Asthma, unspecified asthma severity, unspecified whether complicated, unspecified whether persistent -  Occasional shortness of breath requiring rest - Continue albuterol  follow-up inhaler 2 puffs into the lungs every 6 hours as needed  11. Discoloration of skin of  multiple sites -  Whitish discoloration on neck and right side. -  possibly vitiligo -  will monitor     Labs/tests ordered:   A1C, CMP, CBC   Return in about 3 months (around 06/06/2024).  Brinda Focht Medina-Vargas, NP

## 2024-03-07 LAB — COMPREHENSIVE METABOLIC PANEL WITH GFR
AG Ratio: 0.6 (calc) — ABNORMAL LOW (ref 1.0–2.5)
ALT: 54 U/L — ABNORMAL HIGH (ref 6–29)
AST: 97 U/L — ABNORMAL HIGH (ref 10–30)
Albumin: 3.7 g/dL (ref 3.6–5.1)
Alkaline phosphatase (APISO): 68 U/L (ref 31–125)
BUN: 14 mg/dL (ref 7–25)
CO2: 25 mmol/L (ref 20–32)
Calcium: 9.3 mg/dL (ref 8.6–10.2)
Chloride: 103 mmol/L (ref 98–110)
Creat: 0.54 mg/dL (ref 0.50–0.97)
Globulin: 6.5 g/dL — ABNORMAL HIGH (ref 1.9–3.7)
Glucose, Bld: 79 mg/dL (ref 65–99)
Potassium: 3.9 mmol/L (ref 3.5–5.3)
Sodium: 136 mmol/L (ref 135–146)
Total Bilirubin: 0.5 mg/dL (ref 0.2–1.2)
Total Protein: 10.2 g/dL — ABNORMAL HIGH (ref 6.1–8.1)
eGFR: 121 mL/min/1.73m2 (ref >=60)

## 2024-03-07 LAB — LIPID PANEL
Cholesterol: 117 mg/dL (ref <200)
HDL: 23 mg/dL — ABNORMAL LOW (ref >=50)
LDL Cholesterol (Calc): 74 mg/dL
Non-HDL Cholesterol (Calc): 94 mg/dL (ref <130)
Total CHOL/HDL Ratio: 5.1 (calc) — ABNORMAL HIGH (ref <5.0)
Triglycerides: 121 mg/dL (ref <150)

## 2024-03-07 LAB — HEMOGLOBIN A1C
Hgb A1c MFr Bld: 5.4 % (ref <5.7)
Mean Plasma Glucose: 108 mg/dL
eAG (mmol/L): 6 mmol/L

## 2024-03-07 LAB — CBC WITH DIFFERENTIAL/PLATELET
Absolute Lymphocytes: 1265 {cells}/uL (ref 850–3900)
Absolute Monocytes: 371 {cells}/uL (ref 200–950)
Basophils Absolute: 10 {cells}/uL (ref 0–200)
Basophils Relative: 0.3 %
Eosinophils Absolute: 61 {cells}/uL (ref 15–500)
Eosinophils Relative: 1.8 %
HCT: 33.6 % — ABNORMAL LOW (ref 35.0–45.0)
Hemoglobin: 9.5 g/dL — ABNORMAL LOW (ref 11.7–15.5)
MCH: 21.8 pg — ABNORMAL LOW (ref 27.0–33.0)
MCHC: 28.3 g/dL — ABNORMAL LOW (ref 32.0–36.0)
MCV: 77.2 fL — ABNORMAL LOW (ref 80.0–100.0)
Monocytes Relative: 10.9 %
Neutro Abs: 1693 {cells}/uL (ref 1500–7800)
Neutrophils Relative %: 49.8 %
Platelets: 255 Thousand/uL (ref 140–400)
RBC: 4.35 Million/uL (ref 3.80–5.10)
RDW: 18.7 % — ABNORMAL HIGH (ref 11.0–15.0)
Total Lymphocyte: 37.2 %
WBC: 3.4 Thousand/uL — ABNORMAL LOW (ref 3.8–10.8)

## 2024-03-08 ENCOUNTER — Ambulatory Visit: Payer: Self-pay | Admitting: Adult Health

## 2024-03-08 NOTE — Progress Notes (Signed)
-     Lipid panel normal -   A1c normal -   Hemoglobin 9.5, improved from 8.3 -AST 97, down from 111, liver enzyme improving -   ALT 54, same as previous, still elevated (liver enzyme) -   Total protein elevated -referral to gastroenterology was done previously but has not been done, will send another referral

## 2024-03-23 ENCOUNTER — Other Ambulatory Visit: Payer: Self-pay | Admitting: Adult Health

## 2024-03-23 DIAGNOSIS — K219 Gastro-esophageal reflux disease without esophagitis: Secondary | ICD-10-CM

## 2024-03-24 ENCOUNTER — Ambulatory Visit: Admitting: Podiatry

## 2024-04-14 ENCOUNTER — Emergency Department (HOSPITAL_COMMUNITY)

## 2024-04-14 ENCOUNTER — Other Ambulatory Visit: Payer: Self-pay

## 2024-04-14 DIAGNOSIS — Z833 Family history of diabetes mellitus: Secondary | ICD-10-CM

## 2024-04-14 DIAGNOSIS — Z87891 Personal history of nicotine dependence: Secondary | ICD-10-CM

## 2024-04-14 DIAGNOSIS — Z79899 Other long term (current) drug therapy: Secondary | ICD-10-CM

## 2024-04-14 DIAGNOSIS — Z91048 Other nonmedicinal substance allergy status: Secondary | ICD-10-CM

## 2024-04-14 DIAGNOSIS — Z880 Allergy status to penicillin: Secondary | ICD-10-CM

## 2024-04-14 DIAGNOSIS — J45909 Unspecified asthma, uncomplicated: Secondary | ICD-10-CM | POA: Diagnosis present

## 2024-04-14 DIAGNOSIS — I160 Hypertensive urgency: Secondary | ICD-10-CM | POA: Diagnosis present

## 2024-04-14 DIAGNOSIS — I110 Hypertensive heart disease with heart failure: Secondary | ICD-10-CM | POA: Diagnosis present

## 2024-04-14 DIAGNOSIS — Z8249 Family history of ischemic heart disease and other diseases of the circulatory system: Secondary | ICD-10-CM

## 2024-04-14 DIAGNOSIS — E860 Dehydration: Secondary | ICD-10-CM | POA: Diagnosis present

## 2024-04-14 DIAGNOSIS — I509 Heart failure, unspecified: Secondary | ICD-10-CM | POA: Diagnosis present

## 2024-04-14 DIAGNOSIS — I214 Non-ST elevation (NSTEMI) myocardial infarction: Principal | ICD-10-CM | POA: Diagnosis present

## 2024-04-14 DIAGNOSIS — Z885 Allergy status to narcotic agent status: Secondary | ICD-10-CM

## 2024-04-14 DIAGNOSIS — Z91013 Allergy to seafood: Secondary | ICD-10-CM

## 2024-04-14 DIAGNOSIS — Z820 Family history of epilepsy and other diseases of the nervous system: Secondary | ICD-10-CM

## 2024-04-14 DIAGNOSIS — K810 Acute cholecystitis: Secondary | ICD-10-CM | POA: Diagnosis present

## 2024-04-14 DIAGNOSIS — I071 Rheumatic tricuspid insufficiency: Secondary | ICD-10-CM | POA: Diagnosis present

## 2024-04-14 DIAGNOSIS — G809 Cerebral palsy, unspecified: Secondary | ICD-10-CM | POA: Diagnosis present

## 2024-04-14 DIAGNOSIS — I161 Hypertensive emergency: Principal | ICD-10-CM | POA: Diagnosis present

## 2024-04-14 DIAGNOSIS — G43909 Migraine, unspecified, not intractable, without status migrainosus: Secondary | ICD-10-CM | POA: Diagnosis present

## 2024-04-14 DIAGNOSIS — R7989 Other specified abnormal findings of blood chemistry: Secondary | ICD-10-CM | POA: Diagnosis present

## 2024-04-14 DIAGNOSIS — G40909 Epilepsy, unspecified, not intractable, without status epilepticus: Secondary | ICD-10-CM | POA: Diagnosis present

## 2024-04-14 LAB — COMPREHENSIVE METABOLIC PANEL WITH GFR
ALT: 56 U/L — ABNORMAL HIGH (ref 0–44)
AST: 102 U/L — ABNORMAL HIGH (ref 15–41)
Albumin: 2.6 g/dL — ABNORMAL LOW (ref 3.5–5.0)
Alkaline Phosphatase: 67 U/L (ref 38–126)
Anion gap: 12 (ref 5–15)
BUN: 13 mg/dL (ref 6–20)
CO2: 21 mmol/L — ABNORMAL LOW (ref 22–32)
Calcium: 8.6 mg/dL — ABNORMAL LOW (ref 8.9–10.3)
Chloride: 101 mmol/L (ref 98–111)
Creatinine, Ser: 0.81 mg/dL (ref 0.44–1.00)
GFR, Estimated: >60 mL/min (ref >60)
Glucose, Bld: 101 mg/dL — ABNORMAL HIGH (ref 70–99)
Potassium: 3.9 mmol/L (ref 3.5–5.1)
Sodium: 134 mmol/L — ABNORMAL LOW (ref 135–145)
Total Bilirubin: 0.8 mg/dL (ref 0.0–1.2)
Total Protein: 9.9 g/dL — ABNORMAL HIGH (ref 6.5–8.1)

## 2024-04-14 LAB — I-STAT CHEM 8, ED
BUN: 16 mg/dL (ref 6–20)
Calcium, Ion: 1.1 mmol/L — ABNORMAL LOW (ref 1.15–1.40)
Chloride: 103 mmol/L (ref 98–111)
Creatinine, Ser: 0.8 mg/dL (ref 0.44–1.00)
Glucose, Bld: 103 mg/dL — ABNORMAL HIGH (ref 70–99)
HCT: 30 % — ABNORMAL LOW (ref 36.0–46.0)
Hemoglobin: 10.2 g/dL — ABNORMAL LOW (ref 12.0–15.0)
Potassium: 4.3 mmol/L (ref 3.5–5.1)
Sodium: 140 mmol/L (ref 135–145)
TCO2: 25 mmol/L (ref 22–32)

## 2024-04-14 LAB — CBC
HCT: 31.9 % — ABNORMAL LOW (ref 36.0–46.0)
Hemoglobin: 8.9 g/dL — ABNORMAL LOW (ref 12.0–15.0)
MCH: 21.1 pg — ABNORMAL LOW (ref 26.0–34.0)
MCHC: 27.9 g/dL — ABNORMAL LOW (ref 30.0–36.0)
MCV: 75.8 fL — ABNORMAL LOW (ref 80.0–100.0)
Platelets: 254 K/uL (ref 150–400)
RBC: 4.21 MIL/uL (ref 3.87–5.11)
RDW: 19.1 % — ABNORMAL HIGH (ref 11.5–15.5)
WBC: 4.5 K/uL (ref 4.0–10.5)
nRBC: 0 % (ref 0.0–0.2)

## 2024-04-14 LAB — TROPONIN I (HIGH SENSITIVITY)
Troponin I (High Sensitivity): 159 ng/L (ref <18)
Troponin I (High Sensitivity): 213 ng/L (ref <18)

## 2024-04-14 LAB — HCG, SERUM, QUALITATIVE: Preg, Serum: NEGATIVE

## 2024-04-14 LAB — BRAIN NATRIURETIC PEPTIDE: B Natriuretic Peptide: 1465.8 pg/mL — ABNORMAL HIGH (ref 0.0–100.0)

## 2024-04-14 MED ORDER — SODIUM CHLORIDE 0.9 % IV BOLUS
500.0000 mL | Freq: Once | INTRAVENOUS | Status: AC
  Administered 2024-04-14: 500 mL via INTRAVENOUS

## 2024-04-14 MED ORDER — ONDANSETRON HCL 4 MG/2ML IJ SOLN
4.0000 mg | Freq: Once | INTRAMUSCULAR | Status: AC
  Administered 2024-04-14: 4 mg via INTRAVENOUS
  Filled 2024-04-14: qty 2

## 2024-04-14 MED ORDER — LABETALOL HCL 5 MG/ML IV SOLN
10.0000 mg | Freq: Once | INTRAVENOUS | Status: AC
  Administered 2024-04-14: 10 mg via INTRAVENOUS
  Filled 2024-04-14: qty 4

## 2024-04-14 MED ORDER — MORPHINE SULFATE (PF) 4 MG/ML IV SOLN
4.0000 mg | Freq: Once | INTRAVENOUS | Status: AC
  Administered 2024-04-14: 4 mg via INTRAVENOUS
  Filled 2024-04-14: qty 1

## 2024-04-14 MED ORDER — FUROSEMIDE 10 MG/ML IJ SOLN
40.0000 mg | Freq: Once | INTRAMUSCULAR | Status: AC
  Administered 2024-04-15: 40 mg via INTRAVENOUS
  Filled 2024-04-14: qty 4

## 2024-04-14 MED ORDER — IOHEXOL 350 MG/ML SOLN
75.0000 mL | Freq: Once | INTRAVENOUS | Status: AC | PRN
  Administered 2024-04-14: 75 mL via INTRAVENOUS

## 2024-04-14 NOTE — ED Triage Notes (Signed)
 Now reporting some substernal CP that worsens with inspiration.

## 2024-04-14 NOTE — ED Triage Notes (Addendum)
 L flank/LLQ abdominal pain x 3 days. +N/V as well. TTP.   Hx of CP.

## 2024-04-14 NOTE — ED Provider Notes (Signed)
 [place] [facility]MENT AT [facility] Provider Note   CSN: [id number] Arrival date & time: 04/08/2024  2002     Patient presents with: Abdominal Pain   Dana Parks is a 38 y.o. female with past medical history significant for cerebral palsy, seizure disorder, asthma, migraines, hypertensive emergency, urgency who presents concern for nausea, vomiting, left flank, abdominal pain, nausea, vomiting.  Substernal chest pain worse with inspiration.  She reports that she has not been able to get her blood pressure medication down at home.    Abdominal Pain      Prior to Admission medications   Medication Sig Start Date End Date Taking? Authorizing Provider  albuterol  (VENTOLIN  HFA) 108 (90 Base) MCG/ACT inhaler Inhale 2 puffs into the lungs every 6 (six) hours as needed for wheezing (wheezing). 07/26/23   Medina-Vargas, Monina C, NP  carvedilol  (COREG ) 25 MG tablet Take 1 tablet (25 mg total) by mouth 2 (two) times daily with a meal. 12/16/23   Medina-Vargas, Monina C, NP  ferrous sulfate  325 (65 FE) MG tablet Take 1 tablet (325 mg total) by mouth daily. 10/18/23   Medina-Vargas, Monina C, NP  fluticasone  (FLONASE ) 50 MCG/ACT nasal spray Place 1 spray into both nostrils 2 (two) times daily as needed for allergies or rhinitis. 08/23/23   Medina-Vargas, Monina C, NP  ketoconazole  (NIZORAL ) 2 % cream Apply 1 Application topically 2 (two) times daily. 10/14/23   Medina-Vargas, Monina C, NP  levETIRAcetam  (KEPPRA ) 750 MG tablet Take 1 tablet (750 mg total) by mouth 2 (two) times daily. Follow up with your outpatient neurologist, we adjusted this dose based on your kidney function Patient not taking: Reported on 03/06/2024 07/18/23 10/16/23  Dana Parks., MD  pantoprazole  (PROTONIX ) 40 MG tablet TAKE 1 TABLET(40 MG) BY MOUTH DAILY 03/23/24   Medina-Vargas, Monina C, NP  polyethylene glycol powder (GLYCOLAX /MIRALAX ) 17 GM/SCOOP powder Take 17 g by mouth daily. 10/15/23    Medina-Vargas, Monina C, NP  rosuvastatin  (CRESTOR ) 5 MG tablet Take 5 mg by mouth daily. 02/28/24   [provider]  spironolactone  (ALDACTONE ) 25 MG tablet Take 0.5 tablets (12.5 mg total) by mouth daily. 03/06/24   Medina-Vargas, Monina C, NP    Allergies: Shrimp [shellfish allergy], Codeine, Dust mite extract, Mold extract [trichophyton], Penicillins, and Soap    Review of Systems  Gastrointestinal:  Positive for abdominal pain.  All other systems reviewed and are negative.   Updated Vital Signs BP (!) 174/135   Pulse 84   Temp 97.8 F (36.6 C) (Oral)   Resp (!) 26   Ht 4' 11 (1.499 m)   Wt 50 kg   SpO2 100%   BMI 22.26 kg/m   Physical Exam Vitals and nursing note reviewed.  Constitutional:      General: She is not in acute distress.    Appearance: Normal appearance.  HENT:     Head: Normocephalic and atraumatic.  Eyes:     General:        Right eye: No discharge.        Left eye: No discharge.  Cardiovascular:     Rate and Rhythm: Normal rate and regular rhythm.     Heart sounds: No murmur heard.    No friction rub. No gallop.  Pulmonary:     Effort: Pulmonary effort is normal.     Breath sounds: Normal breath sounds.  Abdominal:     General: Bowel sounds are normal.     Palpations: Abdomen  is soft.     Comments: Focal tenderness to palpation in the left upper quadrant, no rebound, rigidity, mild guarding at this time.  Normal bowel sounds throughout.  Skin:    General: Skin is warm and dry.     Capillary Refill: Capillary refill takes less than 2 seconds.  Neurological:     Mental Status: She is alert and oriented to person, place, and time.  Psychiatric:        Mood and Affect: Mood normal.        Behavior: Behavior normal.     (all labs ordered are listed, but only abnormal results are displayed) Labs Reviewed  COMPREHENSIVE METABOLIC PANEL WITH GFR - Abnormal; Notable for the following components:      Result Value   Sodium 134 (*)    CO2  21 (*)    Glucose, Bld 101 (*)    Calcium  8.6 (*)    Total Protein 9.9 (*)    Albumin  2.6 (*)    AST 102 (*)    ALT 56 (*)    All other components within normal limits  CBC - Abnormal; Notable for the following components:   Hemoglobin 8.9 (*)    HCT 31.9 (*)    MCV 75.8 (*)    MCH 21.1 (*)    MCHC 27.9 (*)    RDW 19.1 (*)    All other components within normal limits  BRAIN NATRIURETIC PEPTIDE - Abnormal; Notable for the following components:   B Natriuretic Peptide 1,465.8 (*)    All other components within normal limits  I-STAT CHEM 8, ED - Abnormal; Notable for the following components:   Glucose, Bld 103 (*)    Calcium , Ion 1.10 (*)    Hemoglobin 10.2 (*)    HCT 30.0 (*)    All other components within normal limits  TROPONIN I (HIGH SENSITIVITY) - Abnormal; Notable for the following components:   Troponin I (High Sensitivity) 159 (*)    All other components within normal limits  TROPONIN I (HIGH SENSITIVITY) - Abnormal; Notable for the following components:   Troponin I (High Sensitivity) 213 (*)    All other components within normal limits  HCG, SERUM, QUALITATIVE  LIPASE, BLOOD  URINALYSIS, ROUTINE W REFLEX MICROSCOPIC    EKG: None  Radiology: CT ABDOMEN PELVIS W CONTRAST Result Date: 04/29/2024 CLINICAL DATA:  Acute abdominal pain EXAM: CT ABDOMEN AND PELVIS WITH CONTRAST TECHNIQUE: Multidetector CT imaging of the abdomen and pelvis was performed using the standard protocol following bolus administration of intravenous contrast. RADIATION DOSE REDUCTION: This exam was performed according to the departmental dose-optimization program which includes automated exposure control, adjustment of the mA and/or kV according to patient size and/or use of iterative reconstruction technique. CONTRAST:  75mL OMNIPAQUE  IOHEXOL  350 MG/ML SOLN COMPARISON:  07/12/2023 FINDINGS: Lower chest: Trace bilateral pleural effusions. No acute airspace disease. The heart is enlarged.  Hepatobiliary: There is marked gallbladder wall thickening, concerning for acute cholecystitis. No calcified gallstones. Liver is unremarkable. Pancreas: Unremarkable. No pancreatic ductal dilatation or surrounding inflammatory changes. Spleen: Normal in size without focal abnormality. Adrenals/Urinary Tract: Adrenal glands are unremarkable. Kidneys are normal, without renal calculi, focal lesion, or hydronephrosis. Bladder is unremarkable. Stomach/Bowel: No bowel obstruction or ileus. Normal appendix right lower quadrant. No bowel wall thickening or inflammatory change. Vascular/Lymphatic: No significant vascular findings are present. No enlarged abdominal or pelvic lymph nodes. Reproductive: Uterus and bilateral adnexa are unremarkable. Other: Trace ascites. No free intraperitoneal gas. No abdominal wall  hernia. Musculoskeletal: No acute or destructive bony abnormalities. Reconstructed images demonstrate no additional findings. IMPRESSION: 1. Marked gallbladder wall thickening, concerning for acute cholecystitis. Further evaluation with right upper quadrant ultrasound may be useful. 2. Small volume ascites. 3. Trace ascites. 4. Cardiomegaly. Electronically Signed   By: Ozell Daring M.D.   On: 04/19/2024 23:00   DG Chest 2 View Result Date: 04/30/2024 EXAM: 2 VIEW(S) XRAY OF THE CHEST 04/30/2024 08:32:00 PM COMPARISON: 07/17/2023 CLINICAL HISTORY: Chest pain. Encounter for chest pain. FINDINGS: LUNGS AND PLEURA: Low lung volumes. Trace bilateral pleural effusions. Diffuse interstitial opacities. HEART AND MEDIASTINUM: Cardiomegaly. BONES AND SOFT TISSUES: No acute osseous abnormality. IMPRESSION: 1. Low lung volumes with trace bilateral pleural effusions and interstitial edema. 2. Cardiomegaly. Electronically signed by: Norman Gatlin MD 04/08/2024 08:36 PM EDT RP Workstation: [id number]     .Critical Care  Performed by: Rosan Sherlean DEL, PA-C Authorized by: Rosan Sherlean DEL, PA-C   Critical  care provider statement:    Critical care time (minutes):  35   Critical care was necessary to treat or prevent imminent or life-threatening deterioration of the following conditions:  Cardiac failure   Critical care was time spent personally by me on the following activities:  Development of treatment plan with patient or surrogate, discussions with consultants, evaluation of patient's response to treatment, examination of patient, ordering and review of laboratory studies, ordering and review of radiographic studies, ordering and performing treatments and interventions, pulse oximetry, re-evaluation of patient's condition and review of old charts   Care discussed with: admitting provider      Medications Ordered in the ED  furosemide  (LASIX ) injection 40 mg (has no administration in time range)  labetalol  (NORMODYNE ) injection 10 mg (10 mg Intravenous Given 04/19/2024 2224)  morphine  (PF) 4 MG/ML injection 4 mg (4 mg Intravenous Given 04/16/2024 2223)  ondansetron  (ZOFRAN ) injection 4 mg (4 mg Intravenous Given 05/04/2024 2222)  sodium chloride  0.9 % bolus 500 mL (0 mLs Intravenous Stopped 05/05/2024 2342)  iohexol  (OMNIPAQUE ) 350 MG/ML injection 75 mL (75 mLs Intravenous Contrast Given 04/29/2024 2253)  labetalol  (NORMODYNE ) injection 10 mg (10 mg Intravenous Given 05/01/2024 2323)                                    Medical Decision Making Amount and/or Complexity of Data Reviewed Labs: ordered. Radiology: ordered.  Risk Prescription drug management.   This patient is a 38 y.o. female  who presents to the ED for concern of abdominal pain, chest pain.   Differential diagnoses prior to evaluation: The emergent differential diagnosis includes, but is not limited to,  The causes of generalized abdominal pain include but are not limited to AAA, mesenteric ischemia, appendicitis, diverticulitis, DKA, gastritis, gastroenteritis, nephrolithiasis, pancreatitis, peritonitis, adrenal insufficiency,lead poisoning,  iron toxicity, intestinal ischemia, constipation, UTI,SBO/LBO, splenic rupture, biliary disease, IBD, IBS, PUD, or hepatitis, ACS, AAS, PE, Mallory-Weiss, Boerhaave's, Pneumonia, acute bronchitis, asthma or COPD exacerbation, anxiety, MSK pain or traumatic injury to the chest, acid reflux versus other . This is not an exhaustive differential.   Past Medical History / Co-morbidities / Social History: cerebral palsy, seizure disorder, asthma, migraines, hypertensive emergency, urgency  Additional history: Chart reviewed. Pertinent results include: Reviewed lab work, imaging from previous emergency department visits  Physical Exam: Physical exam performed. The pertinent findings include: Significant hypertension on arrival, blood pressure 211/121, improved to 165/144 on recheck, still quite elevated diastolic, narrow pulse pressure.  Focally tender in the left upper quadrant, no rebound, rigidity, some guarding.  Normal bowel sounds throughout.  Afebrile, vital signs otherwise stable.  Lab Tests/Imaging studies: I personally interpreted labs/imaging and the pertinent results include: CBC with no leukocytosis, she does have anemia, hemoglobin 8.9, fairly stable compared to her recent baseline.  Her initial troponin is elevated at 159, she does tend to have elevated troponin related to hypertensive emergency, CMP with very mild hyponatremia, sodium 134, low albumin , mild hypocalcemia, mild elevation of AST, ALT, 102, 56 respectively.  Lipase pending, UA pending.  Plain film chest x-ray with trace pleural effusions, no evidence of acute focal consolidation.  CT abdomen pelvis with contrast shows marked gallbladder wall thickening, raising concern for possible acute cholecystitis, given she does have upper abdominal pain, will obtain right upper quadrant ultrasound to further evaluate.  I agree with the radiologist interpretation.  Right upper quadrant ultrasound pending at time of admission.  BNP is elevated  around 1500.  Suspicious for concurrent heart failure exacerbation with her hypertensive urgency/emergency.  Cardiac monitoring: EKG obtained and interpreted by myself and attending physician which shows: Normal sinus rhythm, nonspecific ST-T changes   Medications: I ordered medication including labetalol , morphine , Zofran , fluid bolus for suspected dehydration with multiple episodes of nausea, vomiting, nausea, pain, elevated blood pressure.  I have reviewed the patients home medicines and have made adjustments as needed.   Consults: Spoke with hospitalist, Dr. Laurence who agrees to admission for hypertensive urgency, emergency, NSTEMI related to same.  Disposition: After consideration of the diagnostic results and the patients response to treatment, I feel that patient benefit from admission for hypertensive urgency, emergency, poorly controlled, NSTEMI related to same.    As the admitting team is somewhat behind on admissions at the time of shift handoff will have oncoming PA Burnard Light follow-up on right upper quadrant ultrasound, and manage her hypertensive urgency/emergency until the time that patient is admitted.  Final diagnoses:  NSTEMI (non-ST elevated myocardial infarction) [facility])  Hypertensive emergency    ED Discharge Orders     None          Rosan Sherlean VEAR DEVONNA 04/26/2024 2348    Francesca Elsie CROME, MD 04/17/24 [id number]

## 2024-04-15 ENCOUNTER — Other Ambulatory Visit (HOSPITAL_COMMUNITY)

## 2024-04-15 DIAGNOSIS — I110 Hypertensive heart disease with heart failure: Secondary | ICD-10-CM | POA: Diagnosis present

## 2024-04-15 DIAGNOSIS — I161 Hypertensive emergency: Secondary | ICD-10-CM | POA: Diagnosis present

## 2024-04-15 DIAGNOSIS — I509 Heart failure, unspecified: Secondary | ICD-10-CM | POA: Diagnosis present

## 2024-04-15 DIAGNOSIS — Z91048 Other nonmedicinal substance allergy status: Secondary | ICD-10-CM | POA: Diagnosis not present

## 2024-04-15 DIAGNOSIS — G40909 Epilepsy, unspecified, not intractable, without status epilepticus: Secondary | ICD-10-CM | POA: Diagnosis present

## 2024-04-15 DIAGNOSIS — Z820 Family history of epilepsy and other diseases of the nervous system: Secondary | ICD-10-CM | POA: Diagnosis not present

## 2024-04-15 DIAGNOSIS — I071 Rheumatic tricuspid insufficiency: Secondary | ICD-10-CM | POA: Diagnosis present

## 2024-04-15 DIAGNOSIS — Z880 Allergy status to penicillin: Secondary | ICD-10-CM | POA: Diagnosis not present

## 2024-04-15 DIAGNOSIS — Z87891 Personal history of nicotine dependence: Secondary | ICD-10-CM | POA: Diagnosis not present

## 2024-04-15 DIAGNOSIS — Z79899 Other long term (current) drug therapy: Secondary | ICD-10-CM | POA: Diagnosis not present

## 2024-04-15 DIAGNOSIS — Z885 Allergy status to narcotic agent status: Secondary | ICD-10-CM | POA: Diagnosis not present

## 2024-04-15 DIAGNOSIS — G43909 Migraine, unspecified, not intractable, without status migrainosus: Secondary | ICD-10-CM | POA: Diagnosis present

## 2024-04-15 DIAGNOSIS — E860 Dehydration: Secondary | ICD-10-CM | POA: Diagnosis present

## 2024-04-15 DIAGNOSIS — K810 Acute cholecystitis: Secondary | ICD-10-CM | POA: Diagnosis present

## 2024-04-15 DIAGNOSIS — G809 Cerebral palsy, unspecified: Secondary | ICD-10-CM | POA: Diagnosis present

## 2024-04-15 DIAGNOSIS — Z91013 Allergy to seafood: Secondary | ICD-10-CM | POA: Diagnosis not present

## 2024-04-15 DIAGNOSIS — R7989 Other specified abnormal findings of blood chemistry: Secondary | ICD-10-CM | POA: Diagnosis present

## 2024-04-15 DIAGNOSIS — Z8249 Family history of ischemic heart disease and other diseases of the circulatory system: Secondary | ICD-10-CM | POA: Diagnosis not present

## 2024-04-15 DIAGNOSIS — I214 Non-ST elevation (NSTEMI) myocardial infarction: Secondary | ICD-10-CM | POA: Diagnosis present

## 2024-04-15 DIAGNOSIS — J45909 Unspecified asthma, uncomplicated: Secondary | ICD-10-CM | POA: Diagnosis present

## 2024-04-15 DIAGNOSIS — Z833 Family history of diabetes mellitus: Secondary | ICD-10-CM | POA: Diagnosis not present

## 2024-04-15 LAB — URINALYSIS, ROUTINE W REFLEX MICROSCOPIC
Bacteria, UA: NONE SEEN
Bilirubin Urine: NEGATIVE
Glucose, UA: NEGATIVE mg/dL
Ketones, ur: NEGATIVE mg/dL
Leukocytes,Ua: NEGATIVE
Nitrite: NEGATIVE
Protein, ur: 30 mg/dL — AB
Specific Gravity, Urine: 1.011 (ref 1.005–1.030)
pH: 6 (ref 5.0–8.0)

## 2024-04-15 LAB — COMPREHENSIVE METABOLIC PANEL WITH GFR
ALT: 58 U/L — ABNORMAL HIGH (ref 0–44)
AST: 119 U/L — ABNORMAL HIGH (ref 15–41)
Albumin: 2.5 g/dL — ABNORMAL LOW (ref 3.5–5.0)
Alkaline Phosphatase: 69 U/L (ref 38–126)
Anion gap: 11 (ref 5–15)
BUN: 14 mg/dL (ref 6–20)
CO2: 21 mmol/L — ABNORMAL LOW (ref 22–32)
Calcium: 8.5 mg/dL — ABNORMAL LOW (ref 8.9–10.3)
Chloride: 100 mmol/L (ref 98–111)
Creatinine, Ser: 0.73 mg/dL (ref 0.44–1.00)
GFR, Estimated: >60 mL/min (ref >60)
Glucose, Bld: 103 mg/dL — ABNORMAL HIGH (ref 70–99)
Potassium: 4.2 mmol/L (ref 3.5–5.1)
Sodium: 132 mmol/L — ABNORMAL LOW (ref 135–145)
Total Bilirubin: 1.2 mg/dL (ref 0.0–1.2)
Total Protein: 9.6 g/dL — ABNORMAL HIGH (ref 6.5–8.1)

## 2024-04-15 LAB — CBC
HCT: 29.6 % — ABNORMAL LOW (ref 36.0–46.0)
Hemoglobin: 8.4 g/dL — ABNORMAL LOW (ref 12.0–15.0)
MCH: 21.3 pg — ABNORMAL LOW (ref 26.0–34.0)
MCHC: 28.4 g/dL — ABNORMAL LOW (ref 30.0–36.0)
MCV: 75.1 fL — ABNORMAL LOW (ref 80.0–100.0)
Platelets: 263 K/uL (ref 150–400)
RBC: 3.94 MIL/uL (ref 3.87–5.11)
RDW: 19 % — ABNORMAL HIGH (ref 11.5–15.5)
WBC: 4.9 K/uL (ref 4.0–10.5)
nRBC: 0 % (ref 0.0–0.2)

## 2024-04-15 LAB — CBG MONITORING, ED: Glucose-Capillary: 81 mg/dL (ref 70–99)

## 2024-04-15 LAB — LIPASE, BLOOD: Lipase: 32 U/L (ref 11–51)

## 2024-04-15 LAB — TROPONIN I (HIGH SENSITIVITY): Troponin I (High Sensitivity): 199 ng/L (ref <18)

## 2024-04-15 MED ORDER — METRONIDAZOLE 500 MG/100ML IV SOLN: 500.0000 mg | Freq: Two times a day (BID) | INTRAVENOUS | Status: DC

## 2024-04-15 MED ORDER — NALOXONE HCL 0.4 MG/ML IJ SOLN
0.4000 mg | Freq: Once | INTRAMUSCULAR | Status: AC
  Administered 2024-04-15: 0.4 mg via INTRAVENOUS

## 2024-04-15 MED ORDER — PROCHLORPERAZINE EDISYLATE 10 MG/2ML IJ SOLN: 5.0000 mg | Freq: Four times a day (QID) | INTRAMUSCULAR | Status: DC | PRN

## 2024-04-15 MED ORDER — CARVEDILOL 12.5 MG PO TABS: 25.0000 mg | ORAL_TABLET | Freq: Two times a day (BID) | ORAL | Status: DC

## 2024-04-15 MED ORDER — ACETAMINOPHEN 325 MG PO TABS: 650.0000 mg | ORAL_TABLET | Freq: Four times a day (QID) | ORAL | Status: DC | PRN

## 2024-04-15 MED ORDER — HYDROMORPHONE HCL 1 MG/ML IJ SOLN
0.5000 mg | INTRAMUSCULAR | Status: DC | PRN
  Administered 2024-04-15: 0.5 mg via INTRAVENOUS
  Filled 2024-04-15: qty 1

## 2024-04-15 MED ORDER — PANTOPRAZOLE SODIUM 40 MG IV SOLR
40.0000 mg | Freq: Every day | INTRAVENOUS | Status: DC
  Administered 2024-04-15: 40 mg via INTRAVENOUS
  Filled 2024-04-15: qty 10

## 2024-04-15 MED ORDER — CIPROFLOXACIN IN D5W 400 MG/200ML IV SOLN: 400.0000 mg | Freq: Two times a day (BID) | INTRAVENOUS | Status: DC

## 2024-04-15 MED ORDER — HYDRALAZINE HCL 20 MG/ML IJ SOLN: 5.0000 mg | Freq: Once | INTRAMUSCULAR | Status: DC

## 2024-04-15 MED ORDER — METRONIDAZOLE 500 MG/100ML IV SOLN
500.0000 mg | Freq: Two times a day (BID) | INTRAVENOUS | Status: DC
  Filled 2024-04-15: qty 100

## 2024-04-15 MED ORDER — SODIUM BICARBONATE 8.4 % IV SOLN
INTRAVENOUS | Status: AC | PRN
  Administered 2024-04-15 (×2): 50 meq via INTRAVENOUS

## 2024-04-15 MED ORDER — ROSUVASTATIN CALCIUM 5 MG PO TABS: 5.0000 mg | ORAL_TABLET | Freq: Every day | ORAL | Status: DC

## 2024-04-15 MED ORDER — EPINEPHRINE 1 MG/10ML IJ SOSY
PREFILLED_SYRINGE | INTRAMUSCULAR | Status: AC
  Filled 2024-04-15: qty 40

## 2024-04-15 MED ORDER — ATROPINE SULFATE 1 MG/10ML IJ SOSY
PREFILLED_SYRINGE | INTRAMUSCULAR | Status: AC | PRN
  Administered 2024-04-15: 1 mg via INTRAVENOUS

## 2024-04-15 MED ORDER — CALCIUM CHLORIDE 10 % IV SOLN
INTRAVENOUS | Status: AC | PRN
  Administered 2024-04-15: 1 g via INTRAVENOUS

## 2024-04-15 MED ORDER — NICARDIPINE HCL IN NACL 20-0.86 MG/200ML-% IV SOLN: 3.0000 mg/h | INTRAVENOUS | Status: DC

## 2024-04-15 MED ORDER — SODIUM CHLORIDE 0.9 % IV BOLUS: 1000.0000 mL | Freq: Once | INTRAVENOUS | Status: DC

## 2024-04-15 MED ORDER — ACETAMINOPHEN 650 MG RE SUPP: 650.0000 mg | Freq: Four times a day (QID) | RECTAL | Status: DC | PRN

## 2024-04-15 MED ORDER — ONDANSETRON HCL 4 MG/2ML IJ SOLN: 4.0000 mg | Freq: Four times a day (QID) | INTRAMUSCULAR | Status: DC | PRN

## 2024-04-15 MED ORDER — ASPIRIN 325 MG PO TABS
325.0000 mg | ORAL_TABLET | Freq: Once | ORAL | Status: AC
Start: 2024-04-15 — End: 2024-04-15
  Administered 2024-04-15: 325 mg via ORAL
  Filled 2024-04-15: qty 1

## 2024-04-15 MED ORDER — SODIUM CHLORIDE 0.9 % IV SOLN
1.0000 g | Freq: Three times a day (TID) | INTRAVENOUS | Status: DC
  Filled 2024-04-15 (×4): qty 10

## 2024-04-15 MED ORDER — ONDANSETRON HCL 4 MG PO TABS: 4.0000 mg | ORAL_TABLET | Freq: Four times a day (QID) | ORAL | Status: DC | PRN

## 2024-04-15 MED ORDER — EPINEPHRINE 1 MG/10ML IJ SOSY
PREFILLED_SYRINGE | INTRAMUSCULAR | Status: AC | PRN
  Administered 2024-04-15 (×6): 1 mg via INTRAVENOUS

## 2024-04-20 LAB — CULTURE, BLOOD (ROUTINE X 2): Culture: NO GROWTH

## 2024-04-21 ENCOUNTER — Telehealth: Payer: Self-pay

## 2024-04-21 NOTE — Telephone Encounter (Signed)
 Copied from CRM [id number]. Topic: General - Other >> Apr 21, 2024 11:59 AM Farrel B wrote: Reason for CRM: Stepmother Zelda Dixons [phone] of the patient has called stating that she wanted to see from the provider if she'd be able to tell her the cause of death. She stated the funeral home advised her she would have to pay for an autopsy, but she needed clarification from the provider of the patient.

## 2024-04-21 NOTE — Telephone Encounter (Signed)
 Called Dana Parks and discussed about Dana Parks. She stated that [name] had just gone home for 2 days and was staying with Dana Parks and was drinking alcohol and taking the marijuana gummy which makes Dana feel like floating. Dana Parks will be buried on 04/23/24 per Dana Parks.

## 2024-05-06 NOTE — ED Provider Notes (Addendum)
 Patient was being admitted for abdominal pain when she developed respiratory arrest.  Dr. Midge arrived and was concerned about possible opioid overdose and ordered naloxone  without any benefit.  She initially had pulses which were lost and CPR had been initiated.  On my arrival, CPR was in progress.  Monitored did show sinus rhythm but had a rather slow rate and without pulses.  I proceeded to intubate the patient and ordered medications per ACLS protocol including epinephrine , calcium  chloride, sodium bicarbonate , atropine .  In spite of this, heart rate actually slowed, she never had pulses.  At 1 point, I did an ultrasound to evaluate cardiac activity and noted slight cardiac activity.  I ordered additional IV fluid, IV epinephrine  and atropine  and sodium HCO3.  She did not show any improvement.  Repeat bedside ultrasound showed no cardiac activity.  She was felt to have demonstrated cardiac unresponsiveness and was pronounced dead at 4:20 AM.  I have sent a message to her primary care provider, and I have spoken with her stepmother to inform her of the events.  I also discussed the case with Ms. Wonda, medical examiner, who states that this is a medical death and not a medical examiner's case.  Cardiopulmonary Resuscitation (CPR) Procedure Note Directed/Performed by: Alm Lias I personally directed ancillary staff and/or performed CPR in an effort to regain return of spontaneous circulation and to maintain cardiac, neuro and systemic perfusion.   Date/Time: 2024/05/10 4:42 AM  Performed by: Lias Alm, MDOxygen Delivery Method: Ambu bag Preoxygenation: Pre-oxygenation with 100% oxygen Ventilation: Mask ventilation without difficulty Laryngoscope Size: Glidescope and 3 Grade View: Grade I Tube size: 7.5 mm Number of attempts: 1 Airway Equipment and Method: Rigid stylet and Video-laryngoscopy Placement Confirmation: ETT inserted through vocal cords under direct vision, CO2 detector and  Breath sounds checked- equal and bilateral Secured at: 24 cm Tube secured with: ETT holder Dental Injury: Teeth and Oropharynx as per pre-operative assessment         Lias Alm, MD 05/10/2024 [id number]    Lias Alm, MD May 10, 2024 [phone]

## 2024-05-06 NOTE — Code Documentation (Addendum)
Pulse check- asystole, compressions resumed

## 2024-05-06 NOTE — Progress Notes (Signed)
 VAT consulted for 2nd PIV insertion  Arrived to room and observed staff preforming life saving measures to stabilize pt. Care RN acknowledged pt has 1xworking IV at this time. Care RN to re-consult once pt is stable  Consult complete.

## 2024-05-06 NOTE — Code Documentation (Signed)
 Ultrasound at bedside, MD assess cardiac activity

## 2024-05-06 NOTE — ED Notes (Addendum)
 Received in report from Ole RN at [phone], that Arthea MD stated to give the 0.5mg  of Dilaudid  prior to trying BP meds. Ole RN added me to the secure chat that shows MD saying at 0255, Hi, I just put in a bunch of orders for her. Please give her the pain medication first. I think that will help her blood pressure. MD also added at 0336, Cardene  drip is as needed.   Dilaudid  was given at 0320, attempted to start 2nd IV for possibility of Cardene  drip and was unsuccessful at 0330, so I placed a STAT IV team consult. BP rechecked at 0330 (see documented vitals). 0341 pt started to decline. Claiborne MD was notified and came to the bedside.

## 2024-05-06 NOTE — ED Notes (Signed)
 Pt bagged by Ozell PEAK and Ivan NT.

## 2024-05-06 NOTE — ED Notes (Signed)
 Eye care completed. Pt transported to the morgue.

## 2024-05-06 NOTE — ED Provider Notes (Signed)
 12:36 AM Followed up on RUQ ultrasound. Gallbladder wall is thickened up to 10mm. There is some pericholecystic fluid, but no stones. Negative sonographic Murphy's. Radiology referencing possibility of 3rd spacing of fluid from hepatic, renal, or cardiac pathology. Dr. Laurence of [facility] updated via secure chat.  Sent secure chat message to Dr. Polly of [facility] to inform of imaging results. General surgery will see in consultation this morning for rec's.    US  Abdomen Limited RUQ (LIVER/GB) Result Date: 2024/05/14 EXAM: Right Upper Quadrant Abdominal Ultrasound TECHNIQUE: Real-time ultrasonography of the right upper quadrant of the abdomen was performed. COMPARISON: CT abdomen and pelvis dated 04/30/2024. CLINICAL HISTORY: [id number] RUQ abdominal pain 151470. RUQ abdominal pain FINDINGS: LIVER: Increased hepatic echogenicity. Patent portal vein with normal antegrade flow. Trace ascites about the liver. BILIARY SYSTEM: Nondistended gallbladder with markedly thickened wall measuring 10 mm. Negative sonographic Murphy's sign. There is pericholecystic fluid. No cholelithiasis. No biliary dilation. The common bile duct measures 2 mm. RIGHT KIDNEY: The right kidney is grossly unremarkable in appearances without evidence of hydronephrosis, echogenic calculi or worrisome mass lesions. PANCREAS: Visualized portions of the pancreas are unremarkable. OTHER: No right upper quadrant ascites. IMPRESSION: 1. Nondistended gallbladder with markedly thickened wall (10 mm), pericholecystic fluid, and negative sonographic Murphy sign. No cholelithiasis. Findings are suggestive but not definitive for acute cholecystitis. Differential considerations include 3rd spacing of fluid from hepatic, renal, or cardiac pathology. 2. Hepatic steatosis. Trace ascites about the liver. Electronically signed by: Norman Gatlin MD 05-14-2024 12:07 AM EDT RP Workstation: [id number]    Keith Sor, PA-C 2024/05/14 0041    Raford Lenis, MD May 14, 2024  [phone]

## 2024-05-06 NOTE — Code Documentation (Signed)
Patient time of death occurred at 0420.

## 2024-05-06 NOTE — ED Notes (Signed)
 Attempted to start an IV in pt's left hand, unsuccessful. IV team consult placed STAT.

## 2024-05-06 NOTE — Code Documentation (Signed)
 Claiborne MD contacted at [phone] to notify her that the pt was vomiting and RN requested alternative nausea medication. While at bedside w/ pt her HR dropped into the 50s. RN then contacted Arthea MD to notify her that pt was declining. MD came directly to bedside to assess pt. Pt was in respiratory distress, pulses were present at the time. ED MD and RT also called to bedside. RN started BVM on pt to assist respirations, and Narcan  was given.

## 2024-05-06 NOTE — H&P (Signed)
 History and Physical    Patient: Dana Parks FMW:[id number] DOB: [birth year] DOA: 04/07/2024 DOS: the patient was seen and examined on 04-28-2024 PCP: Phyllis Jereld BROCKS, NP  Patient coming from: Home  Chief Complaint:  Chief Complaint  Patient presents with   Abdominal Pain   HPI: Dana Parks is a 38 y.o. female with medical history significant for cerebral palsy, seizure disorder, asthma, diastolic dysfunction and difficult to control blood pressure presents with 3 days of severe abdominal pain and nausea.  She says it hurts when she moves at all... if she sits up or turns over in the bed she has severe pain.  She has been feeling nauseated but has only thrown up some phlegm.  The pain is worst in her epigastric area but it is all over her abdomen and back and up into her chest.  When she tries to lay flat the pain is is very severe and does radiate more in her chest and she feels like she cannot breathe Over the last 6 months the patient has lost approximately 50 pounds unintentionally.  Her doctor has started her on Ensure daily.  The patient reports that she gets hungry but after 2 or 3 bites she is completely full.  Also she has felt very dehydrated for the last couple of weeks.  She has had stool incontinence for the last 3 months and she has no idea why.   In the emergency department the patient's high blood pressure was treated with IV labetalol .  She had a CT scan of her abdomen and pelvis which showed thickened gallbladder walls.  She then had a right upper quadrant ultrasound which showed the same.  The ultrasound said negative sonographic Murphy sign but the patient almost jumps off the bed when I placed this test stethoscope gently on her belly. General surgery was contacted by the emergency department.  She will be admitted to the hospitalist service.   Echo 07/2023  1. Left ventricular ejection fraction, by estimation, is 60 to 65%. The  left ventricle has normal  function. The left ventricle has no regional  wall motion abnormalities. There is severe concentric left ventricular  hypertrophy. Left ventricular diastolic   parameters are consistent with Grade II diastolic dysfunction  (pseudonormalization).   2. Right ventricular systolic function is normal. The right ventricular  size is normal.   3. The mitral valve is grossly normal. Mild mitral valve regurgitation.   4. Tricuspid valve regurgitation is mild to moderate.   5. The aortic valve is grossly normal. Aortic valve regurgitation is not  visualized.    Review of Systems: As mentioned in the history of present illness. All other systems reviewed and are negative. Past Medical History:  Diagnosis Date   Allergic rhinitis    Anemia    of other chronic disease   Anxiety    Asthma    Back pain    unknown    Cerebral palsy (HCC)    Headache(784.0)    frequently   Hypertension    Incontinence of urine    Liver disease    Denies 09/10/2018   Low sodium diet    Lumbago 05/21/2014   pain in lower back    Muscle spasm    takes Flexeril  daily   Neuromuscular disorder (HCC)    cerebral palsy   Numbness    bilateral feet   Peripheral edema    occasionally   PONV (postoperative nausea and vomiting)    Rhegmatogenous retinal  detachment of both eyes    Seizure disorder (HCC)    Seizures (HCC)    last one in high school;takes Depakote  daily   Wears glasses    Past Surgical History:  Procedure Laterality Date   CESAREAN SECTION  2007   CESAREAN SECTION WITH BILATERAL TUBAL LIGATION Bilateral 03/03/2015   Procedure: CESAREAN SECTION WITH BILATERAL TUBAL LIGATION;  Surgeon: Elveria Mungo, MD;  Location: [facility];  Service: Obstetrics;  Laterality: Bilateral;  Requested 03/03/15 @ 3:30p  Ok per Cleo/Myra-TM   GAS/FLUID EXCHANGE Right 09/11/2018   Procedure: Gas/Fluid Exchange RIGHT EYE;  Surgeon: Valdemar Rogue, MD;  Location: [facility] OR;  Service: Ophthalmology;  Laterality: Right;  [id number]    LASER PHOTO ABLATION Left 09/11/2018   Procedure: LASER PHOTO ABLATION LEFT EYE;  Surgeon: Valdemar Rogue, MD;  Location: [facility] OR;  Service: Ophthalmology;  Laterality: Left;   LEG SURGERY Bilateral    PARS PLANA VITRECTOMY Right 09/11/2018   Procedure: PARS PLANA VITRECTOMY WITH 25 GAUGE RIGHT EYE;  Surgeon: Valdemar Rogue, MD;  Location: [facility] OR;  Service: Ophthalmology;  Laterality: Right;   PERFLUORONE INJECTION Right 09/11/2018   Procedure: Perfluorone Injection RIGHT EYE;  Surgeon: Valdemar Rogue, MD;  Location: [facility] OR;  Service: Ophthalmology;  Laterality: Right;   PHOTOCOAGULATION WITH LASER Right 09/11/2018   Procedure: Photocoagulation With Laser RIGHT EYE;  Surgeon: Valdemar Rogue, MD;  Location: [facility] OR;  Service: Ophthalmology;  Laterality: Right;   SCLERAL BUCKLE Right 09/11/2018   Procedure: Scleral Buckle RIGHT EYE;  Surgeon: Valdemar Rogue, MD;  Location: [facility] OR;  Service: Ophthalmology;  Laterality: Right;   TOOTH EXTRACTION N/A 02/16/2013   Procedure: EXTRACTION 16, 17, 32;  Surgeon: Glendia CHRISTELLA Primrose, DDS;  Location: [facility] OR;  Service: Oral Surgery;  Laterality: N/A;   Social History:  reports that she quit smoking about 9 years ago. Her smoking use included cigarettes. She started smoking about 16 years ago. She has a 1.8 pack-year smoking history. She has never used smokeless tobacco. She reports that she does not currently use drugs after having used the following drugs: Marijuana. She reports that she does not drink alcohol.  Allergies  Allergen Reactions   Shrimp [Shellfish Allergy] Anaphylaxis    Can eat other shellfish   Codeine Other (See Comments)    GI upset   Dust Mite Extract Other (See Comments)    Unknown reaction   Mold Extract [Trichophyton] Other (See Comments)    Unknown reaction   Penicillins Hives and Other (See Comments)    Has patient had a PCN reaction causing immediate rash, facial/tongue/throat swelling, SOB or lightheadedness with hypotension: No Has patient had a PCN  reaction causing severe rash involving mucus membranes or skin necrosis: No Has patient had a PCN reaction that required hospitalization: Yes Has patient had a PCN reaction occurring within the last 10 years: No If all of the above answers are NO, then may proceed with Cephalosporin use.  Unknown reaction; pt has gotten cefazolin    Soap Other (See Comments)    Unknown reaction    Family History  Problem Relation Age of Onset   High blood pressure Mother    Diabetes Maternal Grandfather    Hypertension Maternal Grandfather    Seizures Maternal Uncle    High blood pressure Other        mother's side     Prior to Admission medications   Medication Sig Start Date End Date Taking? Authorizing Provider  albuterol  (VENTOLIN  HFA) 108 (90 Base) MCG/ACT  inhaler Inhale 2 puffs into the lungs every 6 (six) hours as needed for wheezing (wheezing). 07/26/23   Medina-Vargas, Monina C, NP  carvedilol  (COREG ) 25 MG tablet Take 1 tablet (25 mg total) by mouth 2 (two) times daily with a meal. 12/16/23   Medina-Vargas, Monina C, NP  ferrous sulfate  325 (65 FE) MG tablet Take 1 tablet (325 mg total) by mouth daily. 10/18/23   Medina-Vargas, Monina C, NP  fluticasone  (FLONASE ) 50 MCG/ACT nasal spray Place 1 spray into both nostrils 2 (two) times daily as needed for allergies or rhinitis. 08/23/23   Medina-Vargas, Monina C, NP  ketoconazole  (NIZORAL ) 2 % cream Apply 1 Application topically 2 (two) times daily. 10/14/23   Medina-Vargas, Monina C, NP  levETIRAcetam  (KEPPRA ) 750 MG tablet Take 1 tablet (750 mg total) by mouth 2 (two) times daily. Follow up with your outpatient neurologist, we adjusted this dose based on your kidney function Patient not taking: Reported on 03/06/2024 07/18/23 10/16/23  [name] DELENA Meliton Mickey., MD  pantoprazole  (PROTONIX ) 40 MG tablet TAKE 1 TABLET(40 MG) BY MOUTH DAILY 03/23/24   Medina-Vargas, Monina C, NP  polyethylene glycol powder (GLYCOLAX /MIRALAX ) 17 GM/SCOOP powder Take 17 g by  mouth daily. 10/15/23   Medina-Vargas, Monina C, NP  rosuvastatin  (CRESTOR ) 5 MG tablet Take 5 mg by mouth daily. 02/28/24   [provider]  spironolactone  (ALDACTONE ) 25 MG tablet Take 0.5 tablets (12.5 mg total) by mouth daily. 03/06/24   Medina-Vargas, Jereld BROCKS, NP    Physical Exam: Vitals:   May 09, 2024 0045 05-09-24 0115 2024-05-09 0130 05/09/24 0145  BP: (!) 144/115 (!) 151/120 (!) 158/127 (!) 159/116  Pulse: 87 85 86 84  Resp:      Temp:      TempSrc:      SpO2: 98% 100% 100% 100%  Weight:      Height:       Physical Exam:  General: No acute distress, well developed, well nourished HEENT: Normocephalic, atraumatic, teeth look very dry.  Her mouth appears small almost as if she could not close it over her teeth. Cardiovascular: Normal rate and rhythm. DP pulses are not palpable but this skin is dry. Pulmonary: Normal pulmonary effort, few crackles seem to clear with deep breaths Gastrointestinal: Very distended abdomen, severe tenderness diffusely, normoactive bowel sounds Musculoskeletal:No lower ext edema Skin: Skin is warm and dry but very tight over all of her digits.  The tips of her fingers and toes seem to be more narrow than the proximal portion. Neuro: It is difficult to understand her speaking.  This is chronic. Her dry mouth may be making it more difficult AAOx3. PSYCH: Attentive and cooperative  Data Reviewed:  Results for orders placed or performed during the hospital encounter of 04/17/2024 (from the past 24 hours)  Comprehensive metabolic panel     Status: Abnormal   Collection Time: 04/21/2024  8:20 PM  Result Value Ref Range   Sodium 134 (L) 135 - 145 mmol/L   Potassium 3.9 3.5 - 5.1 mmol/L   Chloride 101 98 - 111 mmol/L   CO2 21 (L) 22 - 32 mmol/L   Glucose, Bld 101 (H) 70 - 99 mg/dL   BUN 13 6 - 20 mg/dL   Creatinine, Ser 9.18 0.44 - 1.00 mg/dL   Calcium  8.6 (L) 8.9 - 10.3 mg/dL   Total Protein 9.9 (H) 6.5 - 8.1 g/dL   Albumin  2.6 (L) 3.5 - 5.0 g/dL    AST 897 (H) 15 - 41 U/L  ALT 56 (H) 0 - 44 U/L   Alkaline Phosphatase 67 38 - 126 U/L   Total Bilirubin 0.8 0.0 - 1.2 mg/dL   GFR, Estimated >39 >39 mL/min   Anion gap 12 5 - 15  CBC     Status: Abnormal   Collection Time: 04/22/2024  8:20 PM  Result Value Ref Range   WBC 4.5 4.0 - 10.5 K/uL   RBC 4.21 3.87 - 5.11 MIL/uL   Hemoglobin 8.9 (L) 12.0 - 15.0 g/dL   HCT 68.0 (L) 63.9 - 53.9 %   MCV 75.8 (L) 80.0 - 100.0 fL   MCH 21.1 (L) 26.0 - 34.0 pg   MCHC 27.9 (L) 30.0 - 36.0 g/dL   RDW 80.8 (H) 88.4 - 84.4 %   Platelets 254 150 - 400 K/uL   nRBC 0.0 0.0 - 0.2 %  hCG, serum, qualitative     Status: None   Collection Time: 04/26/2024  8:20 PM  Result Value Ref Range   Preg, Serum NEGATIVE NEGATIVE  Troponin I (High Sensitivity)     Status: Abnormal   Collection Time: 04/12/2024  8:20 PM  Result Value Ref Range   Troponin I (High Sensitivity) 159 (HH) <18 ng/L  Brain natriuretic peptide     Status: Abnormal   Collection Time: 04/29/2024  8:20 PM  Result Value Ref Range   B Natriuretic Peptide 1,465.8 (H) 0.0 - 100.0 pg/mL  I-stat chem 8, ED (not at [facility], [facility] or [facility])     Status: Abnormal   Collection Time: 04/24/2024  8:30 PM  Result Value Ref Range   Sodium 140 135 - 145 mmol/L   Potassium 4.3 3.5 - 5.1 mmol/L   Chloride 103 98 - 111 mmol/L   BUN 16 6 - 20 mg/dL   Creatinine, Ser 9.19 0.44 - 1.00 mg/dL   Glucose, Bld 896 (H) 70 - 99 mg/dL   Calcium , Ion 1.10 (L) 1.15 - 1.40 mmol/L   TCO2 25 22 - 32 mmol/L   Hemoglobin 10.2 (L) 12.0 - 15.0 g/dL   HCT 69.9 (L) 63.9 - 53.9 %  Troponin I (High Sensitivity)     Status: Abnormal   Collection Time: 04/16/2024  9:57 PM  Result Value Ref Range   Troponin I (High Sensitivity) 213 (HH) <18 ng/L   RUQ Ultrasound IMPRESSION: 1. Nondistended gallbladder with markedly thickened wall (10 mm), pericholecystic fluid, and negative sonographic Murphy sign. No cholelithiasis. Findings are suggestive but not definitive for acute  cholecystitis. Differential considerations include 3rd spacing of fluid from hepatic, renal, or cardiac pathology. 2. Hepatic steatosis. Trace ascites about the liver.    Assessment and Plan: Hypertensive emergency with CHF and elevated troponin -The patient's blood pressure is slightly better with a couple of doses of beta-blocker but because of the elevated troponin and CHF, a Cardene  drip has been ordered if BP goes back up. -She required a dose of IV Lasix  x 1 - Follow troponin - Continue her oral carvedilol  - Calcium  channel blockers are often also indicated for people with scleroderma. - Aspirin  x 1 - Echocardiogram ordered  2.  Acute cholecystitis  - Cefepime  ordered (PCN allergy) - NPO - General surgery consulted - Lipase pending - Pain control - The patient received IV Lasix  in the ED but overall she looks extremely dehydrated.  Once her blood pressure is better controlled we will put her on some low-dose IV fluids.  3.  Seizure disorder -patient no longer takes her Keppra   4.  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  - Consider outpatient rheumatology follow-up once she is discharged even if her screening tests are negative. - Will order anti-Scl-70 and ANA.   Advance Care Planning:   Code Status: Prior   Consults: General Surgery  Family Communication: None  Severity of Illness: The appropriate patient status for this patient is INPATIENT. Inpatient status is judged to be reasonable and necessary in order to provide the required intensity of service to ensure the patient's safety. The patient's presenting symptoms, physical exam findings, and initial radiographic and laboratory data in the context of their chronic comorbidities is felt to place them at high risk for further clinical deterioration. Furthermore, it is not anticipated that the patient  will be medically stable for discharge from the hospital within 2 midnights of admission.   * I certify that at the point of admission it is my clinical judgment that the patient will require inpatient hospital care spanning beyond 2 midnights from the point of admission due to high intensity of service, high risk for further deterioration and high frequency of surveillance required.*  Author: ARTHEA CHILD, MD 04-16-24 2:04 AM  For on call review [email or web address].

## 2024-05-06 NOTE — Code Documentation (Signed)
 Compression resumed after ultrasound cardiac activity check

## 2024-05-06 NOTE — Code Documentation (Signed)
Pulse check- asystole, compressions resumed

## 2024-05-06 NOTE — Code Documentation (Signed)
 Pulse check- asystole, TOD 0420

## 2024-05-06 NOTE — ED Provider Notes (Signed)
 At approximately 3:50 AM I was called to the room due to change in status of the patient.  When I arrived to the room patient appeared obtunded, bradypnea and pinpoint pupils bilaterally.  Her heart rate and blood pressure were appropriate and she had pulses Oxygen was applied to the patient I was informed the patient had just recently received Dilaudid .  CBG was checked which was appropriate, and patient given 1 mg of Narcan .  She had no immediate response. Soon after, patient became bradycardic and I started to pace the patient.  Patient then went into cardiac arrest and CPR was initiated Soon after initiation of CPR, Dr. Raford entered the room to take over care of patient.  Intubation and further resuscitation was guided by Dr. Raford  CPR  Date/Time: 05-13-2024 4:00 AM  Performed by: Midge Golas, MD Authorized by: Midge Golas, MD  CPR Procedure Details:    ACLS/BLS initiated by EMS: No     CPR/ACLS performed in the ED: Yes     Outcome: Pt declared dead    CPR performed via ACLS guidelines under my direct supervision.  See RN documentation for details including defibrillator use, medications, doses and timing.     Midge Golas, MD 05-13-24 [phone]

## 2024-05-06 DEATH — deceased

## 2024-05-11 ENCOUNTER — Ambulatory Visit: Admitting: Adult Health
